# Patient Record
Sex: Female | Born: 1960
Health system: Southern US, Community
[De-identification: ages and names within clinical notes are randomized; demographics above are authoritative.]

## PROBLEM LIST (undated history)

## (undated) DIAGNOSIS — F1910 Other psychoactive substance abuse, uncomplicated: Secondary | ICD-10-CM

## (undated) DIAGNOSIS — I2510 Atherosclerotic heart disease of native coronary artery without angina pectoris: Secondary | ICD-10-CM

## (undated) DIAGNOSIS — Z720 Tobacco use: Secondary | ICD-10-CM

## (undated) DIAGNOSIS — I83899 Varicose veins of unspecified lower extremities with other complications: Secondary | ICD-10-CM

## (undated) DIAGNOSIS — I209 Angina pectoris, unspecified: Secondary | ICD-10-CM

## (undated) DIAGNOSIS — R0602 Shortness of breath: Secondary | ICD-10-CM

## (undated) DIAGNOSIS — I10 Essential (primary) hypertension: Secondary | ICD-10-CM

## (undated) DIAGNOSIS — Z789 Other specified health status: Secondary | ICD-10-CM

## (undated) DIAGNOSIS — K219 Gastro-esophageal reflux disease without esophagitis: Secondary | ICD-10-CM

## (undated) DIAGNOSIS — I83893 Varicose veins of bilateral lower extremities with other complications: Secondary | ICD-10-CM

## (undated) DIAGNOSIS — G8929 Other chronic pain: Secondary | ICD-10-CM

## (undated) DIAGNOSIS — I219 Acute myocardial infarction, unspecified: Secondary | ICD-10-CM

## (undated) DIAGNOSIS — M797 Fibromyalgia: Secondary | ICD-10-CM

## (undated) DIAGNOSIS — F329 Major depressive disorder, single episode, unspecified: Secondary | ICD-10-CM

## (undated) DIAGNOSIS — K760 Fatty (change of) liver, not elsewhere classified: Secondary | ICD-10-CM

## (undated) DIAGNOSIS — E871 Hypo-osmolality and hyponatremia: Secondary | ICD-10-CM

## (undated) DIAGNOSIS — E119 Type 2 diabetes mellitus without complications: Secondary | ICD-10-CM

## (undated) DIAGNOSIS — E785 Hyperlipidemia, unspecified: Secondary | ICD-10-CM

## (undated) DIAGNOSIS — F419 Anxiety disorder, unspecified: Secondary | ICD-10-CM

## (undated) DIAGNOSIS — M1990 Unspecified osteoarthritis, unspecified site: Secondary | ICD-10-CM

## (undated) DIAGNOSIS — G709 Myoneural disorder, unspecified: Secondary | ICD-10-CM

## (undated) DIAGNOSIS — K432 Incisional hernia without obstruction or gangrene: Secondary | ICD-10-CM

## (undated) DIAGNOSIS — J449 Chronic obstructive pulmonary disease, unspecified: Secondary | ICD-10-CM

## (undated) DIAGNOSIS — F1120 Opioid dependence, uncomplicated: Secondary | ICD-10-CM

## (undated) DIAGNOSIS — F32A Depression, unspecified: Secondary | ICD-10-CM

## (undated) HISTORY — DX: Unspecified osteoarthritis, unspecified site: M19.90

## (undated) HISTORY — DX: Incisional hernia without obstruction or gangrene: K43.2

## (undated) HISTORY — PX: CARDIAC CATHETERIZATION: SHX172

## (undated) HISTORY — DX: Chronic obstructive pulmonary disease, unspecified: J44.9

## (undated) HISTORY — PX: TUBAL LIGATION: SHX77

## (undated) HISTORY — PX: CARDIAC SURGERY: SHX584

## (undated) HISTORY — DX: Acute myocardial infarction, unspecified: I21.9

## (undated) HISTORY — PX: TONSILLECTOMY AND ADENOIDECTOMY: SHX28

## (undated) HISTORY — DX: Depression, unspecified: F32.A

## (undated) HISTORY — DX: Other chronic pain: G89.29

## (undated) HISTORY — DX: Hyperlipidemia, unspecified: E78.5

## (undated) HISTORY — DX: Fatty (change of) liver, not elsewhere classified: K76.0

## (undated) HISTORY — DX: Other psychoactive substance abuse, uncomplicated: F19.10

## (undated) HISTORY — DX: Anxiety disorder, unspecified: F41.9

## (undated) HISTORY — DX: Myoneural disorder, unspecified: G70.9

## (undated) HISTORY — DX: Gastro-esophageal reflux disease without esophagitis: K21.9

## (undated) HISTORY — PX: CHOLECYSTECTOMY: SHX55

## (undated) HISTORY — PX: MANDIBLE FRACTURE SURGERY: SHX706

## (undated) HISTORY — DX: Essential (primary) hypertension: I10

## (undated) HISTORY — DX: Atherosclerotic heart disease of native coronary artery without angina pectoris: I25.10

## (undated) HISTORY — DX: Major depressive disorder, single episode, unspecified: F32.9

## (undated) MED FILL — Ferumoxytol Inj 510 MG/17ML (30 MG/ML) (Elemental Fe): INTRAVENOUS | Qty: 17 | Status: AC

---
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
## 1997-08-19 ENCOUNTER — Emergency Department (HOSPITAL_COMMUNITY): Admission: EM | Admit: 1997-08-19 | Discharge: 1997-08-19 | Payer: Self-pay | Admitting: Emergency Medicine

## 1998-06-07 ENCOUNTER — Emergency Department (HOSPITAL_COMMUNITY): Admission: EM | Admit: 1998-06-07 | Discharge: 1998-06-07 | Payer: Self-pay | Admitting: Emergency Medicine

## 1998-06-07 ENCOUNTER — Encounter: Payer: Self-pay | Admitting: Emergency Medicine

## 2000-04-27 ENCOUNTER — Emergency Department (HOSPITAL_COMMUNITY): Admission: EM | Admit: 2000-04-27 | Discharge: 2000-04-27 | Payer: Self-pay | Admitting: Emergency Medicine

## 2000-05-05 ENCOUNTER — Emergency Department (HOSPITAL_COMMUNITY): Admission: EM | Admit: 2000-05-05 | Discharge: 2000-05-05 | Payer: Self-pay | Admitting: Emergency Medicine

## 2000-07-21 ENCOUNTER — Emergency Department (HOSPITAL_COMMUNITY): Admission: EM | Admit: 2000-07-21 | Discharge: 2000-07-21 | Payer: Self-pay | Admitting: Emergency Medicine

## 2000-07-21 ENCOUNTER — Encounter: Payer: Self-pay | Admitting: Emergency Medicine

## 2000-09-05 ENCOUNTER — Encounter: Payer: Self-pay | Admitting: Chiropractic Medicine

## 2000-09-05 ENCOUNTER — Ambulatory Visit (HOSPITAL_COMMUNITY): Admission: RE | Admit: 2000-09-05 | Discharge: 2000-09-05 | Payer: Self-pay | Admitting: Chiropractic Medicine

## 2005-06-19 ENCOUNTER — Emergency Department (HOSPITAL_COMMUNITY): Admission: EM | Admit: 2005-06-19 | Discharge: 2005-06-19 | Payer: Self-pay | Admitting: Emergency Medicine

## 2005-10-11 ENCOUNTER — Emergency Department (HOSPITAL_COMMUNITY): Admission: EM | Admit: 2005-10-11 | Discharge: 2005-10-11 | Payer: Self-pay | Admitting: Emergency Medicine

## 2007-09-07 ENCOUNTER — Emergency Department: Payer: Self-pay | Admitting: Emergency Medicine

## 2008-02-29 DIAGNOSIS — I219 Acute myocardial infarction, unspecified: Secondary | ICD-10-CM

## 2008-02-29 HISTORY — DX: Acute myocardial infarction, unspecified: I21.9

## 2008-08-27 ENCOUNTER — Emergency Department (HOSPITAL_COMMUNITY): Admission: EM | Admit: 2008-08-27 | Discharge: 2008-08-27 | Payer: Self-pay | Admitting: Emergency Medicine

## 2011-01-08 ENCOUNTER — Encounter (INDEPENDENT_AMBULATORY_CARE_PROVIDER_SITE_OTHER): Payer: Self-pay | Admitting: General Surgery

## 2011-01-10 ENCOUNTER — Encounter (INDEPENDENT_AMBULATORY_CARE_PROVIDER_SITE_OTHER): Payer: Self-pay | Admitting: General Surgery

## 2011-01-10 ENCOUNTER — Other Ambulatory Visit (INDEPENDENT_AMBULATORY_CARE_PROVIDER_SITE_OTHER): Payer: Self-pay | Admitting: General Surgery

## 2011-01-10 ENCOUNTER — Ambulatory Visit (INDEPENDENT_AMBULATORY_CARE_PROVIDER_SITE_OTHER): Payer: Medicare Other | Admitting: General Surgery

## 2011-01-10 VITALS — BP 138/86 | HR 84 | Temp 98.7°F | Resp 18 | Ht 64.0 in | Wt 203.6 lb

## 2011-01-10 DIAGNOSIS — K430 Incisional hernia with obstruction, without gangrene: Secondary | ICD-10-CM

## 2011-01-10 DIAGNOSIS — R222 Localized swelling, mass and lump, trunk: Secondary | ICD-10-CM

## 2011-01-10 NOTE — Progress Notes (Signed)
Patient ID: Makayla Fischer, female   DOB: February 05, 1960, 50 y.o.   MRN: [id number]  Chief Complaint  Patient presents with  . Other    new pt- eval ventral hernia    HPI Makayla Fischer is a 50 y.o. female.  She is referred by Dr. Duffy Rhody for evaluation of an abdominal wall hernia.  The patient has had a laparoscopic tubal ligation many years ago through an infraumbilical incision. She gives a one-month history of a lump to the right of her umbilicus. This is a little bit tender. She cannot push it back in. She has not had any nausea, vomiting, or change in her bowel habits. She has not had any imaging studies.  Significant medical problems are coronary artery disease, MI, cardiac cath with stent in 2004 in [place] and 2010 at [facility]. She is not actively followed by cardiologist. She also has ongoing tobacco abuse. She has chronic pain which is managed with methadone by Dr. Marina Goodell. She has hypertension, anxiety, and she states that she may have been told she had Barrett's esophagus in the past.  She is in no acute distress today. HPI  Past Medical History  Diagnosis Date  . Hypertension   . Chronic pain   . Hyperlipidemia   . GERD (gastroesophageal reflux disease)   . Anxiety   . Depression   . CAD (coronary artery disease)   . Arthritis   . Neuromuscular disorder   . Myocardial infarction 02/2008  . Osteoporosis   . Substance abuse     drugs and alcohol    Past Surgical History  Procedure Date  . Tonsillectomy and adenoidectomy   . Cardiac surgery   . Coronary artery bypass graft   . Mandible fracture surgery     Family History  Problem Relation Age of Onset  . Diabetes Mother   . Hyperlipidemia Mother   . Hypertension Mother   . Heart disease Mother   . Cancer Father     lymphoma    Social History History  Substance Use Topics  . Smoking status: Current Everyday Smoker -- 0.5 packs/day  . Smokeless tobacco: Not on file  . Alcohol Use: Yes     Allergies  Allergen Reactions  . Septra (Bactrim)     Current Outpatient Prescriptions  Medication Sig Dispense Refill  . amLODipine (NORVASC) 10 MG tablet Take 10 mg by mouth daily.        Marland Kitchen aspirin 81 MG tablet Take 81 mg by mouth daily.        Marland Kitchen buPROPion (WELLBUTRIN SR) 150 MG 12 hr tablet Take 150 mg by mouth 2 (two) times daily.        . calcium carbonate 200 MG capsule Take 1,000 mg by mouth daily.        . carvedilol (COREG) 6.25 MG tablet Take 6.25 mg by mouth 2 (two) times daily with a meal.        . chlorthalidone (HYGROTON) 50 MG tablet daily.      . clonazePAM (KLONOPIN) 1 MG tablet Take 1 mg by mouth 2 (two) times daily as needed.        . cyclobenzaprine (FLEXERIL) 10 MG tablet Take 10 mg by mouth 3 (three) times daily as needed.        . isosorbide mononitrate (IMDUR) 30 MG 24 hr tablet Take 30 mg by mouth daily.        Marland Kitchen lisinopril (PRINIVIL,ZESTRIL) 20 MG tablet Take 20 mg by mouth  daily.        . methadone (DOLOPHINE) 10 MG tablet Take 10 mg by mouth every 8 (eight) hours.        . Multiple Vitamins-Minerals (MULTIVITAMIN WITH MINERALS) tablet Take 1 tablet by mouth daily.        . nitroGLYCERIN (NITROSTAT) 0.4 MG SL tablet Place 0.4 mg under the tongue every 5 (five) minutes as needed.        Marland Kitchen omeprazole (PRILOSEC) 20 MG capsule Take 20 mg by mouth daily.        . pravastatin (PRAVACHOL) 40 MG tablet Take 40 mg by mouth daily.        . traMADol (ULTRAM) 50 MG tablet Take 50 mg by mouth every 6 (six) hours as needed. Maximum dose= 8 tablets per day         Review of Systems Review of Systems  Constitutional: Negative for fever, chills and unexpected weight change.  HENT: Negative for hearing loss, congestion, sore throat, trouble swallowing and voice change.   Eyes: Negative for visual disturbance.  Respiratory: Negative for cough and wheezing.   Cardiovascular: Negative for chest pain, palpitations and leg swelling.  Gastrointestinal: Positive for abdominal  pain. Negative for nausea, vomiting, diarrhea, constipation, blood in stool, abdominal distention and anal bleeding.  Genitourinary: Negative for hematuria, vaginal bleeding and difficulty urinating.  Musculoskeletal: Positive for myalgias, back pain and arthralgias.  Skin: Negative for rash and wound.  Neurological: Negative for seizures, syncope and headaches.  Hematological: Negative for adenopathy. Does not bruise/bleed easily.  Psychiatric/Behavioral: Negative for confusion.    Blood pressure 138/86, pulse 84, temperature 98.7 F (37.1 C), temperature source Temporal, resp. rate 18, height 5\' 4"  (1.626 m), weight 203 lb 9.6 oz (92.352 kg).  Physical Exam Physical Exam  Constitutional: She is oriented to person, place, and time. She appears well-developed and well-nourished. No distress.       strong odor of tobacco.  HENT:  Head: Normocephalic and atraumatic.  Nose: Nose normal.  Mouth/Throat: No oropharyngeal exudate.  Eyes: Conjunctivae and EOM are normal. Pupils are equal, round, and reactive to light. Left eye exhibits no discharge. No scleral icterus.  Neck: Neck supple. No JVD present. No tracheal deviation present. No thyromegaly present.  Cardiovascular: Normal rate, regular rhythm, normal heart sounds and intact distal pulses.   No murmur heard. Pulmonary/Chest: Effort normal and breath sounds normal. No respiratory distress. She has no wheezes. She has no rales. She exhibits no tenderness.  Abdominal: Soft. Bowel sounds are normal. She exhibits no distension and no mass. There is no tenderness. There is no rebound and no guarding.    Musculoskeletal: She exhibits no edema and no tenderness.  Lymphadenopathy:    She has no cervical adenopathy.  Neurological: She is alert and oriented to person, place, and time. She exhibits normal muscle tone. Coordination normal.  Skin: Skin is warm. No rash noted. She is not diaphoretic. No erythema. No pallor.  Psychiatric: She has a  normal mood and affect. Her behavior is normal. Judgment and thought content normal.    Data Reviewed  I reviewed several office notes from Dr. Duffy Rhody  Assessment    Incarcerated ventral incisional hernia to right of umbilicus. She will likely benefit from elective repair.  Coronary artery disease, status post MI, status post cath and  stent x2. Stable.  Tobacco abuse  Hypertension  Chronic pain managed with methadone  Obesity    Plan    I discussed her diagnosis and  some of the surgical options with her. She is interested in surgical repair.  We will schedule for CT scan of abdomen and pelvis to get better definition of intra-abdominal anatomy.  I told her that she would need to be referred to a cardiologist for cardiac clearance for general anesthesia. She will contact Dr. Marina Goodell  To have this arranged.  Return to see me in 3 weeks to review the CT scan and to schedule for definitive surgery.       Shekia Kuper M 01/10/2011, 9:37 AM

## 2011-01-10 NOTE — Patient Instructions (Signed)
I think that you have an incarcerated incisional hernia to the right of the umbilicus. This is tender and painful. You will be scheduled for a CT scan of the abdomen and pelvis to better define what is going on inside your abdomen. You will return to see me in 2-3 weeks to discuss surgery for this hernia. You will need to see a cardiologist prior to any surgery to clear you for general anesthesia. Please call Dr. Marina Goodell and arrange for cardiology consultation.

## 2011-01-15 ENCOUNTER — Ambulatory Visit
Admission: RE | Admit: 2011-01-15 | Discharge: 2011-01-15 | Disposition: A | Discharge status: Home / Self Care | Payer: Medicare Other | Source: Ambulatory Visit | Attending: General Surgery | Admitting: General Surgery

## 2011-01-15 ENCOUNTER — Encounter (INDEPENDENT_AMBULATORY_CARE_PROVIDER_SITE_OTHER): Payer: Self-pay | Admitting: Legal Medicine

## 2011-01-15 DIAGNOSIS — R222 Localized swelling, mass and lump, trunk: Secondary | ICD-10-CM

## 2011-01-15 MED ORDER — IOHEXOL 300 MG/ML  SOLN
125.0000 mL | Freq: Once | INTRAMUSCULAR | Status: AC | PRN
  Administered 2011-01-15: 125 mL via INTRAVENOUS

## 2011-02-04 ENCOUNTER — Encounter (INDEPENDENT_AMBULATORY_CARE_PROVIDER_SITE_OTHER): Payer: Medicare Other | Admitting: General Surgery

## 2011-04-18 ENCOUNTER — Encounter (INDEPENDENT_AMBULATORY_CARE_PROVIDER_SITE_OTHER): Payer: Self-pay

## 2011-05-06 ENCOUNTER — Telehealth (INDEPENDENT_AMBULATORY_CARE_PROVIDER_SITE_OTHER): Payer: Self-pay | Admitting: General Surgery

## 2011-05-07 ENCOUNTER — Telehealth (INDEPENDENT_AMBULATORY_CARE_PROVIDER_SITE_OTHER): Payer: Self-pay

## 2011-05-07 NOTE — Telephone Encounter (Signed)
I tried to reach pt re: appt.  Unable to reach pt at # given. Could not leave msg mail box full. I have put in appt for May 2 to arrive at 10:30.

## 2011-05-08 ENCOUNTER — Telehealth (INDEPENDENT_AMBULATORY_CARE_PROVIDER_SITE_OTHER): Payer: Self-pay

## 2011-05-08 NOTE — Telephone Encounter (Signed)
Pt notified of 5-2 appt.

## 2011-05-30 ENCOUNTER — Encounter (INDEPENDENT_AMBULATORY_CARE_PROVIDER_SITE_OTHER): Payer: Medicare Other | Admitting: General Surgery

## 2011-06-07 ENCOUNTER — Encounter (INDEPENDENT_AMBULATORY_CARE_PROVIDER_SITE_OTHER): Payer: Medicare Other | Admitting: General Surgery

## 2011-06-12 ENCOUNTER — Encounter (INDEPENDENT_AMBULATORY_CARE_PROVIDER_SITE_OTHER): Payer: Self-pay | Admitting: General Surgery

## 2011-06-12 ENCOUNTER — Telehealth (INDEPENDENT_AMBULATORY_CARE_PROVIDER_SITE_OTHER): Payer: Self-pay

## 2011-06-12 ENCOUNTER — Ambulatory Visit (INDEPENDENT_AMBULATORY_CARE_PROVIDER_SITE_OTHER): Payer: Medicare Other | Admitting: General Surgery

## 2011-06-12 VITALS — BP 126/64 | HR 60 | Temp 97.8°F | Resp 16 | Ht 64.0 in | Wt 198.0 lb

## 2011-06-12 DIAGNOSIS — K432 Incisional hernia without obstruction or gangrene: Secondary | ICD-10-CM | POA: Insufficient documentation

## 2011-06-12 NOTE — Progress Notes (Signed)
Patient ID: Makayla Fischer, female   DOB: 01/14/1961, 51 y.o.   MRN: [id number]  Chief Complaint  Patient presents with  . Incisional Hernia    HPI Makayla Fischer is a 51 y.o. female.  She returns for further discussion regarding her incisional hernia.  She states that her hernia has gotten a little bit bigger. She says it is still uncomfortable and hard to push back in.  CT scan shows a small periumbilical incisional hernia containing fat. She has significant fatty infiltration of the liver.  She has been evaluated by her cardiologist, Dr. Gypsy Balsam at [facility] cardiology [facility].    Nuclear medicine stress test looked good with no ischemia, normal wall motion, ejection fraction greater than 65%, normal exercise capacity.  She wants to have her hernia repaired HPI  Past Medical History  Diagnosis Date  . Hypertension   . Chronic pain   . Hyperlipidemia   . GERD (gastroesophageal reflux disease)   . Anxiety   . Depression   . CAD (coronary artery disease)   . Arthritis   . Neuromuscular disorder   . Myocardial infarction 02/2008  . Osteoporosis   . Substance abuse     drugs and alcohol  . Incisional hernia     with obstruction    Past Surgical History  Procedure Date  . Tonsillectomy and adenoidectomy   . Cardiac surgery   . Coronary artery bypass graft   . Mandible fracture surgery     Family History  Problem Relation Age of Onset  . Diabetes Mother   . Hyperlipidemia Mother   . Hypertension Mother   . Heart disease Mother   . Cancer Father     lymphoma    Social History History  Substance Use Topics  . Smoking status: Current Everyday Smoker -- 0.5 packs/day  . Smokeless tobacco: Never Used  . Alcohol Use: Yes    Allergies  Allergen Reactions  . Septra (Bactrim) Shortness Of Breath and Itching    Current Outpatient Prescriptions  Medication Sig Dispense Refill  . amLODipine (NORVASC) 10 MG tablet Take 10 mg by mouth  daily.        Marland Kitchen aspirin 81 MG tablet Take 81 mg by mouth daily.        Marland Kitchen buPROPion (WELLBUTRIN SR) 150 MG 12 hr tablet Take 150 mg by mouth 2 (two) times daily.        . calcium carbonate 200 MG capsule Take 1,000 mg by mouth daily.        . carvedilol (COREG) 6.25 MG tablet Take 6.25 mg by mouth 2 (two) times daily with a meal.        . chlorthalidone (HYGROTON) 50 MG tablet daily.      . clonazePAM (KLONOPIN) 1 MG tablet Take 1 mg by mouth 2 (two) times daily as needed.        . cyclobenzaprine (FLEXERIL) 10 MG tablet Take 10 mg by mouth 3 (three) times daily as needed.        . isosorbide mononitrate (IMDUR) 30 MG 24 hr tablet Take 30 mg by mouth daily.        Marland Kitchen lisinopril (PRINIVIL,ZESTRIL) 20 MG tablet Take 20 mg by mouth daily.        . methadone (DOLOPHINE) 10 MG tablet Take 10 mg by mouth every 8 (eight) hours.        . Multiple Vitamins-Minerals (MULTIVITAMIN WITH MINERALS) tablet Take 1 tablet by mouth daily.        Marland Kitchen  nitroGLYCERIN (NITROSTAT) 0.4 MG SL tablet Place 0.4 mg under the tongue every 5 (five) minutes as needed.        Marland Kitchen omeprazole (PRILOSEC) 20 MG capsule Take 20 mg by mouth daily.        . pravastatin (PRAVACHOL) 40 MG tablet Take 40 mg by mouth daily.        . traMADol (ULTRAM) 50 MG tablet Take 50 mg by mouth every 6 (six) hours as needed. Maximum dose= 8 tablets per day         Review of Systems Review of Systems  Constitutional: Negative for fever, chills and unexpected weight change.  HENT: Negative for hearing loss, congestion, sore throat, trouble swallowing and voice change.   Eyes: Negative for visual disturbance.  Respiratory: Negative for cough and wheezing.   Cardiovascular: Negative for chest pain, palpitations and leg swelling.  Gastrointestinal: Negative for nausea, vomiting, abdominal pain, diarrhea, constipation, blood in stool, abdominal distention and anal bleeding.  Genitourinary: Negative for hematuria, vaginal bleeding and difficulty urinating.    Musculoskeletal: Negative for arthralgias.  Skin: Negative for rash and wound.  Neurological: Negative for seizures, syncope and headaches.  Hematological: Negative for adenopathy. Does not bruise/bleed easily.  Psychiatric/Behavioral: Negative for confusion.    Blood pressure 126/64, pulse 60, temperature 97.8 F (36.6 C), temperature source Temporal, resp. rate 16, height 5\' 4"  (1.626 m), weight 198 lb (89.812 kg).  Physical Exam Physical Exam  Constitutional: She is oriented to person, place, and time. She appears well-developed and well-nourished. No distress.       Obese. Odor of tobacco.  HENT:  Head: Normocephalic and atraumatic.  Nose: Nose normal.  Mouth/Throat: No oropharyngeal exudate.  Eyes: Conjunctivae and EOM are normal. Pupils are equal, round, and reactive to light. Left eye exhibits no discharge. No scleral icterus.  Neck: Neck supple. No JVD present. No tracheal deviation present. No thyromegaly present.  Cardiovascular: Normal rate, regular rhythm, normal heart sounds and intact distal pulses.   No murmur heard. Pulmonary/Chest: Effort normal and breath sounds normal. No respiratory distress. She has no wheezes. She has no rales. She exhibits no tenderness.  Abdominal: Soft. Bowel sounds are normal. She exhibits no distension and no mass. There is no tenderness. There is no rebound and no guarding.       Small hernia to the right of the umbilicus, I can forcibly reduce when she is supine. The defect is 2-3 cm. The hernia sac is probably 6 or 7 cm in size.  Musculoskeletal: She exhibits no edema and no tenderness.  Lymphadenopathy:    She has no cervical adenopathy.  Neurological: She is alert and oriented to person, place, and time. She exhibits normal muscle tone. Coordination normal.  Skin: Skin is warm. No rash noted. She is not diaphoretic. No erythema. No pallor.  Psychiatric: She has a normal mood and affect. Her behavior is normal. Judgment and thought  content normal.    Data Reviewed Cardiology notes  Assessment    Ventral incisional hernia, symptomatic. This hernia is not large and should be amenable to laparoscopic repair with inlay mesh. I discussed this with her and she would like to have this done  Status post laparoscopic tubal ligation  CAD, status post myocardial infarction, status post cardiac catheterization with stent placement in 2004 in [place] and  in 2010 at [place].  Tobacco abuse  Chronic pain syndrome on methadone  Hypertension  Obesity.    Plan    Will schedule for  laparoscopic repair of ventral hernia with mesh, possible open at either [facility] or [facility].  She is encouraged to stop smoking  I have discussed indications and details of surgery with her. She knows that she is at increased risk for wound infection and recurrence. Other issues would include bleeding, injury to adjacent organs requiring major laparotomy, cardiac, pulmonary and thromboembolic problems. She understands the tissues. Her questions were answered. She agrees with this plan.       Angelia Mould. Derrell Lolling, M.D., [facility] General and Minimally invasive Surgery Breast and Colorectal Surgery Office:   [phone] Pager:   [phone]  06/12/2011, 12:33 PM

## 2011-06-12 NOTE — Telephone Encounter (Signed)
I received call from cardiologists office that pt is cleared from surgery. I advised nurse that we need note in print that pt cleared. She will have MD make note and fax it to [phone].

## 2011-06-12 NOTE — Patient Instructions (Signed)
You will be scheduled for surgery to repair your incisional hernia. We will probably be able to do this laparoscopically, but there is a small chance it may have to be converted to an open operation.

## 2011-06-28 ENCOUNTER — Inpatient Hospital Stay (HOSPITAL_COMMUNITY): Admission: RE | Admit: 2011-06-28 | Discharge: 2011-06-28 | Payer: Medicare Other | Source: Ambulatory Visit

## 2011-06-28 ENCOUNTER — Encounter (HOSPITAL_COMMUNITY): Payer: Self-pay

## 2011-06-28 NOTE — Pre-Procedure Instructions (Signed)
20 Makayla Fischer  06/28/2011   Your procedure is scheduled on:  07/12/2011  Report to [facility] at 5:30 AM.  Call this number if you have problems the morning of surgery: [phone]   Remember:   Do not eat food:After Midnight. 07/11/2011  May have clear liquids: up to 4 Hours before arrival.  NOTHING AFTER 1:30a.m.   Clear liquids include soda, tea, black coffee, apple or grape juice, broth.  Take these medicines the morning of surgery with A SIP OF WATER: wellbutrin,coreg,methadone   Do not wear jewelry, make-up or nail polish.  Do not wear lotions, powders, or perfumes. You may wear deodorant.  Do not shave 48 hours prior to surgery. Men may shave face and neck.  Do not bring valuables to the hospital.  Contacts, dentures or bridgework may not be worn into surgery.  Leave suitcase in the car. After surgery it may be brought to your room.  For patients admitted to the hospital, checkout time is 11:00 AM the day of discharge.   Patients discharged the day of surgery will not be allowed to drive home.  Name and phone number of your driver: /w ex spouse  Special Instructions: CHG Shower Use Special Wash: 1/2 bottle night before surgery and 1/2 bottle morning of surgery.   Please read over the following fact sheets that you were given: Pain Booklet, Coughing and Deep Breathing, MRSA Information and Surgical Site Infection Prevention

## 2011-06-28 NOTE — Progress Notes (Signed)
Call to [employer], after hrs. At present, will try again next business day.

## 2011-07-01 ENCOUNTER — Encounter (HOSPITAL_COMMUNITY): Payer: Self-pay | Admitting: Respiratory Therapy

## 2011-07-03 ENCOUNTER — Encounter (HOSPITAL_COMMUNITY)
Admission: RE | Admit: 2011-07-03 | Discharge: 2011-07-03 | Disposition: A | Discharge status: Home / Self Care | Payer: Medicare Other | Source: Ambulatory Visit | Attending: General Surgery | Admitting: General Surgery

## 2011-07-03 LAB — CBC
HCT: 39.7 % (ref 36.0–46.0)
MCHC: 35.8 g/dL (ref 30.0–36.0)
MCV: 94.1 fL (ref 78.0–100.0)
RDW: 12.1 % (ref 11.5–15.5)

## 2011-07-03 LAB — COMPREHENSIVE METABOLIC PANEL
Albumin: 3.8 g/dL (ref 3.5–5.2)
BUN: 6 mg/dL (ref 6–23)
Creatinine, Ser: 0.58 mg/dL (ref 0.50–1.10)
GFR calc Af Amer: >90 mL/min (ref >90)
Glucose, Bld: 157 mg/dL — ABNORMAL HIGH (ref 70–99)
Total Bilirubin: 0.8 mg/dL (ref 0.3–1.2)
Total Protein: 7.4 g/dL (ref 6.0–8.3)

## 2011-07-03 LAB — URINALYSIS, ROUTINE W REFLEX MICROSCOPIC
Glucose, UA: NEGATIVE mg/dL
Leukocytes, UA: NEGATIVE
Nitrite: NEGATIVE
Protein, ur: NEGATIVE mg/dL

## 2011-07-03 LAB — SURGICAL PCR SCREEN: MRSA, PCR: NEGATIVE

## 2011-07-03 NOTE — Pre-Procedure Instructions (Signed)
20 Makayla Fischer  07/03/2011   Your procedure is scheduled on:  Friday July 12, 2011.  Report to [facility] at 0530 AM.  Call this number if you have problems the morning of surgery: [phone]   Remember:   Do not eat food:After Midnight.  May have clear liquids: up to 4 Hours before arrival until 0130 am.  Clear liquids include soda, tea, black coffee, apple or grape juice, broth.  Take these medicines the morning of surgery with A SIP OF WATER: Amlodipine (Norvasc), Bupropion (Wellbutrin), Carvedilol (Coreg), Clonazepam (Klonopin), Gabapentin (Neurontin), Isosorbide (Imdur), Methadone (Dolophine), Omeprazole (Prilosec) and Tramadol (Ultram) if needed for pain.   Do not wear jewelry, make-up or nail polish.  Do not wear lotions, powders, or perfumes.   Do not shave 48 hours prior to surgery.   Do not bring valuables to the hospital.  Contacts, dentures or bridgework may not be worn into surgery.  Leave suitcase in the car. After surgery it may be brought to your room.  For patients admitted to the hospital, checkout time is 11:00 AM the day of discharge.   Patients discharged the day of surgery will not be allowed to drive home.  Name and phone number of your driver:   Special Instructions: CHG Shower Use Special Wash: 1/2 bottle night before surgery and 1/2 bottle morning of surgery.   Please read over the following fact sheets that you were given: Pain Booklet, Coughing and Deep Breathing, MRSA Information and Surgical Site Infection Prevention

## 2011-07-04 ENCOUNTER — Encounter (HOSPITAL_COMMUNITY): Payer: Self-pay | Admitting: Vascular Surgery

## 2011-07-04 NOTE — Consult Note (Signed)
Anesthesia Chart Review:  Patient is a 51 year old female scheduled for laparoscopic versus open repair of incisional hernia on 07/12/11.  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  Her Cardiologist is Dr. Ledora Bottcher at [facility].  He has cleared Ms. Jeter with low risk.  PCP is Dr. Brent Bulla [phone]) Cardiology records are re-requested on 07/04/11.    EKG on 07/03/11 showed NSR.  Stress test on 02/18/11 (under Media tab) showed no evidence of ischemia, normal wall motion, EF > 65%.  CT scan of the abdomen/pelvis on 01/15/11 showed: 1. Right periumbilical hernia containing fat.  2. Severe fatty infiltration of the liver.  3. The appendix and terminal ileum appear normal.  CXR on 07/03/11 showed: No acute cardiopulmonary disease. Scarring in the left lower lobe. Stable examination.   Labs reviewed.  Non-fasting glucose is 157.  Alk Ph 184, AST 81, ALT 64.  Her PLT count is WNL. According to Epic, labs have already been reviewed by Dr. Derrell Lolling.  She is on statin therapy and has fatty liver by CT.  I called her PCP office and on 03/16/11 her AST was 63 and ALT 59, Alk Phos 143.  There has been a mild increase in her transaminases since February.  There is no thrombocytopenia.  Will not plan to do any additional labs preoperatively.  Anesthesiologist Dr. Noreene Larsson agrees.  Shonna Chock, PA-C

## 2011-07-08 ENCOUNTER — Telehealth (INDEPENDENT_AMBULATORY_CARE_PROVIDER_SITE_OTHER): Payer: Self-pay | Admitting: General Surgery

## 2011-07-08 NOTE — Telephone Encounter (Signed)
This patient called with concerns about her upcoming ventral hernia surgery. I called both her home phone and her mobile phone. The mailbox was full on both phones and I could not leave a message. We will try to contact her later.   Makayla Fischer. Derrell Lolling, M.D., [facility] General and Minimally invasive Surgery Breast and Colorectal Surgery Office:   [phone] Pager:   [phone]

## 2011-07-11 MED ORDER — HEPARIN SODIUM (PORCINE) 5000 UNIT/ML IJ SOLN
5000.0000 [IU] | Freq: Once | INTRAMUSCULAR | Status: AC
  Administered 2011-07-12: 5000 [IU] via SUBCUTANEOUS
  Filled 2011-07-11: qty 1

## 2011-07-11 MED ORDER — CHLORHEXIDINE GLUCONATE 4 % EX LIQD: 1.0000 "application " | Freq: Once | CUTANEOUS | Status: DC

## 2011-07-11 MED ORDER — CEFAZOLIN SODIUM-DEXTROSE 2-3 GM-% IV SOLR
2.0000 g | INTRAVENOUS | Status: AC
  Administered 2011-07-12: 2 g via INTRAVENOUS
  Filled 2011-07-11: qty 50

## 2011-07-11 NOTE — H&P (Signed)
Makayla Fischer    MRN: [id number]   Description: 51 year old female  Provider: Ernestene Mention, MD  Department: [facility]      Diagnoses     Incisional hernia   - Primary    553.21    Vitals       BP Pulse Temp Resp Ht Wt    126/64 60 97.8 F (36.6 C) (Temporal) 16 5\' 4"  (1.626 m) 198 lb (89.812 kg)    BMI - 33.99 kg/m2                 History and Physical    Ernestene Mention, MD Patient ID: Makayla Fischer, female   DOB: February 04, 1960, 51 y.o.   MRN: [id number]             HPI Makayla Fischer is a 50 y.o. female.  She presents for elective surgery fo her incisional hernia.  She states that her hernia has gotten a little bit bigger. She says it is still uncomfortable and hard to push back in.  CT scan shows a small periumbilical incisional hernia containing fat. She has significant fatty infiltration of the liver.  She has been evaluated by her cardiologist, Dr. Gypsy Balsam at [facility] cardiology [facility].    Nuclear medicine stress test looked good with no ischemia, normal wall motion, ejection fraction greater than 65%, normal exercise capacity.  She wants to have her hernia repaired     Past Medical History   Diagnosis  Date   .  Hypertension     .  Chronic pain     .  Hyperlipidemia     .  GERD (gastroesophageal reflux disease)     .  Anxiety     .  Depression     .  CAD (coronary artery disease)     .  Arthritis     .  Neuromuscular disorder     .  Myocardial infarction  02/2008   .  Osteoporosis     .  Substance abuse         drugs and alcohol   .  Incisional hernia         with obstruction       Past Surgical History   Procedure  Date   .  Tonsillectomy and adenoidectomy     .  Cardiac surgery     .  Coronary artery bypass graft     .  Mandible fracture surgery         Family History   Problem  Relation  Age of Onset   .  Diabetes  Mother     .  Hyperlipidemia  Mother     .  Hypertension  Mother       .  Heart disease  Mother     .  Cancer  Father         lymphoma      Social History History   Substance Use Topics   .  Smoking status:  Current Everyday Smoker -- 0.5 packs/day   .  Smokeless tobacco:  Never Used   .  Alcohol Use:  Yes       Allergies   Allergen  Reactions   .  Septra (Bactrim)  Shortness Of Breath and Itching       Current Outpatient Prescriptions   Medication  Sig  Dispense  Refill   .  amLODipine (NORVASC) 10 MG tablet  Take  10 mg by mouth daily.           Marland Kitchen  aspirin 81 MG tablet  Take 81 mg by mouth daily.           Marland Kitchen  buPROPion (WELLBUTRIN SR) 150 MG 12 hr tablet  Take 150 mg by mouth 2 (two) times daily.           .  calcium carbonate 200 MG capsule  Take 1,000 mg by mouth daily.           .  carvedilol (COREG) 6.25 MG tablet  Take 6.25 mg by mouth 2 (two) times daily with a meal.           .  chlorthalidone (HYGROTON) 50 MG tablet  daily.         .  clonazePAM (KLONOPIN) 1 MG tablet  Take 1 mg by mouth 2 (two) times daily as needed.           .  cyclobenzaprine (FLEXERIL) 10 MG tablet  Take 10 mg by mouth 3 (three) times daily as needed.           .  isosorbide mononitrate (IMDUR) 30 MG 24 hr tablet  Take 30 mg by mouth daily.           Marland Kitchen  lisinopril (PRINIVIL,ZESTRIL) 20 MG tablet  Take 20 mg by mouth daily.           .  methadone (DOLOPHINE) 10 MG tablet  Take 10 mg by mouth every 8 (eight) hours.           .  Multiple Vitamins-Minerals (MULTIVITAMIN WITH MINERALS) tablet  Take 1 tablet by mouth daily.           .  nitroGLYCERIN (NITROSTAT) 0.4 MG SL tablet  Place 0.4 mg under the tongue every 5 (five) minutes as needed.           Marland Kitchen  omeprazole (PRILOSEC) 20 MG capsule  Take 20 mg by mouth daily.           .  pravastatin (PRAVACHOL) 40 MG tablet  Take 40 mg by mouth daily.           .  traMADol (ULTRAM) 50 MG tablet  Take 50 mg by mouth every 6 (six) hours as needed. Maximum dose= 8 tablets per day             Review of Systems   Constitutional:  Negative for fever, chills and unexpected weight change.  HENT: Negative for hearing loss, congestion, sore throat, trouble swallowing and voice change.   Eyes: Negative for visual disturbance.  Respiratory: Negative for cough and wheezing.   Cardiovascular: Negative for chest pain, palpitations and leg swelling.  Gastrointestinal: Negative for nausea, vomiting, abdominal pain, diarrhea, constipation, blood in stool, abdominal distention and anal bleeding.  Genitourinary: Negative for hematuria, vaginal bleeding and difficulty urinating.  Musculoskeletal: Negative for arthralgias.  Skin: Negative for rash and wound.  Neurological: Negative for seizures, syncope and headaches.  Hematological: Negative for adenopathy. Does not bruise/bleed easily.  Psychiatric/Behavioral: Negative for confusion.    Blood pressure 126/64, pulse 60, temperature 97.8 F (36.6 C), temperature source Temporal, resp. rate 16, height 5\' 4"  (1.626 m), weight 198 lb (89.812 kg).   Physical Exam   Constitutional: She is oriented to person, place, and time. She appears well-developed and well-nourished. No distress.       Obese. Odor of tobacco.  HENT:   Head: Normocephalic and  atraumatic.   Nose: Nose normal.   Mouth/Throat: No oropharyngeal exudate.  Eyes: Conjunctivae and EOM are normal. Pupils are equal, round, and reactive to light. Left eye exhibits no discharge. No scleral icterus.  Neck: Neck supple. No JVD present. No tracheal deviation present. No thyromegaly present.  Cardiovascular: Normal rate, regular rhythm, normal heart sounds and intact distal pulses.    No murmur heard. Pulmonary/Chest: Effort normal and breath sounds normal. No respiratory distress. She has no wheezes. She has no rales. She exhibits no tenderness.  Abdominal: Soft. Bowel sounds are normal. She exhibits no distension and no mass. There is no tenderness. There is no rebound and no guarding.       Small hernia to the right of the  umbilicus, I can forcibly reduce when she is supine. The defect is 2-3 cm. The hernia sac is probably 6 or 7 cm in size.  Musculoskeletal: She exhibits no edema and no tenderness.  Lymphadenopathy:    She has no cervical adenopathy.  Neurological: She is alert and oriented to person, place, and time. She exhibits normal muscle tone. Coordination normal.  Skin: Skin is warm. No rash noted. She is not diaphoretic. No erythema. No pallor.  Psychiatric: She has a normal mood and affect. Her behavior is normal. Judgment and thought content normal.       Assessment  Ventral incisional hernia, symptomatic. This hernia is not large and should be amenable to laparoscopic repair with inlay mesh. I discussed this with her and she would like to have this done   Status post laparoscopic tubal ligation   CAD, status post myocardial infarction, status post cardiac catheterization with stent placement in 2004 in [place] and  in 2010 at [place].   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   Plan  Will schedule for laparoscopic repair of ventral hernia with mesh, possible open at either [facility] or [facility].   She is encouraged to stop smoking   I have discussed indications and details of surgery with her. She knows that she is at increased risk for wound infection and recurrence. Other issues would include bleeding, injury to adjacent organs requiring major laparotomy, cardiac, pulmonary and thromboembolic problems. She understands the tissues. Her questions were answered. She agrees with this plan.       Angelia Mould. Derrell Lolling, M.D., [facility] General and Minimally invasive Surgery Breast and Colorectal Surgery Office:   [phone] Pager:   [phone]

## 2011-07-12 ENCOUNTER — Encounter (HOSPITAL_COMMUNITY): Payer: Self-pay | Admitting: *Deleted

## 2011-07-12 ENCOUNTER — Encounter (HOSPITAL_COMMUNITY): Payer: Self-pay | Admitting: Vascular Surgery

## 2011-07-12 ENCOUNTER — Encounter (HOSPITAL_COMMUNITY)
Admission: RE | Disposition: A | Discharge status: Home / Self Care | Payer: Self-pay | Source: Ambulatory Visit | Attending: General Surgery

## 2011-07-12 ENCOUNTER — Ambulatory Visit (HOSPITAL_COMMUNITY): Payer: Medicare Other | Admitting: Vascular Surgery

## 2011-07-12 ENCOUNTER — Ambulatory Visit (HOSPITAL_COMMUNITY)
Admission: RE | Admit: 2011-07-12 | Discharge: 2011-07-15 | Disposition: A | Discharge status: Home / Self Care | Payer: Medicare Other | Source: Ambulatory Visit | Attending: General Surgery | Admitting: General Surgery

## 2011-07-12 DIAGNOSIS — F17200 Nicotine dependence, unspecified, uncomplicated: Secondary | ICD-10-CM | POA: Insufficient documentation

## 2011-07-12 DIAGNOSIS — I252 Old myocardial infarction: Secondary | ICD-10-CM | POA: Insufficient documentation

## 2011-07-12 DIAGNOSIS — C001 Malignant neoplasm of external lower lip: Secondary | ICD-10-CM | POA: Insufficient documentation

## 2011-07-12 DIAGNOSIS — F411 Generalized anxiety disorder: Secondary | ICD-10-CM | POA: Insufficient documentation

## 2011-07-12 DIAGNOSIS — I10 Essential (primary) hypertension: Secondary | ICD-10-CM | POA: Insufficient documentation

## 2011-07-12 DIAGNOSIS — J4489 Other specified chronic obstructive pulmonary disease: Secondary | ICD-10-CM | POA: Insufficient documentation

## 2011-07-12 DIAGNOSIS — IMO0001 Reserved for inherently not codable concepts without codable children: Secondary | ICD-10-CM | POA: Insufficient documentation

## 2011-07-12 DIAGNOSIS — F329 Major depressive disorder, single episode, unspecified: Secondary | ICD-10-CM | POA: Insufficient documentation

## 2011-07-12 DIAGNOSIS — K439 Ventral hernia without obstruction or gangrene: Secondary | ICD-10-CM

## 2011-07-12 DIAGNOSIS — K219 Gastro-esophageal reflux disease without esophagitis: Secondary | ICD-10-CM | POA: Insufficient documentation

## 2011-07-12 DIAGNOSIS — K432 Incisional hernia without obstruction or gangrene: Secondary | ICD-10-CM | POA: Insufficient documentation

## 2011-07-12 DIAGNOSIS — J449 Chronic obstructive pulmonary disease, unspecified: Secondary | ICD-10-CM | POA: Insufficient documentation

## 2011-07-12 DIAGNOSIS — F3289 Other specified depressive episodes: Secondary | ICD-10-CM | POA: Insufficient documentation

## 2011-07-12 HISTORY — DX: Shortness of breath: R06.02

## 2011-07-12 HISTORY — DX: Fibromyalgia: M79.7

## 2011-07-12 HISTORY — DX: Angina pectoris, unspecified: I20.9

## 2011-07-12 HISTORY — PX: VENTRAL HERNIA REPAIR: SHX424

## 2011-07-12 LAB — CBC
HCT: 35.7 % — ABNORMAL LOW (ref 36.0–46.0)
MCV: 95.5 fL (ref 78.0–100.0)
Platelets: 184 10*3/uL (ref 150–400)
RBC: 3.74 MIL/uL — ABNORMAL LOW (ref 3.87–5.11)
RDW: 12.2 % (ref 11.5–15.5)
WBC: 7.7 10*3/uL (ref 4.0–10.5)

## 2011-07-12 LAB — CREATININE, SERUM
GFR calc Af Amer: >90 mL/min (ref >90)
GFR calc non Af Amer: >90 mL/min (ref >90)

## 2011-07-12 SURGERY — REPAIR, HERNIA, VENTRAL, LAPAROSCOPIC
Anesthesia: General | Site: Abdomen | Wound class: Clean

## 2011-07-12 MED ORDER — METHADONE HCL 10 MG PO TABS
20.0000 mg | ORAL_TABLET | Freq: Four times a day (QID) | ORAL | Status: DC
  Administered 2011-07-12 – 2011-07-15 (×11): 20 mg via ORAL
  Filled 2011-07-12: qty 1
  Filled 2011-07-12 (×9): qty 2
  Filled 2011-07-12: qty 1
  Filled 2011-07-12: qty 2

## 2011-07-12 MED ORDER — ONDANSETRON HCL 4 MG PO TABS: 4.0000 mg | ORAL_TABLET | Freq: Four times a day (QID) | ORAL | Status: DC | PRN

## 2011-07-12 MED ORDER — CHLORTHALIDONE 50 MG PO TABS
50.0000 mg | ORAL_TABLET | Freq: Every day | ORAL | Status: DC
  Administered 2011-07-12 – 2011-07-15 (×4): 50 mg via ORAL
  Filled 2011-07-12 (×4): qty 1

## 2011-07-12 MED ORDER — LORAZEPAM 2 MG/ML IJ SOLN: 1.0000 mg | Freq: Once | INTRAMUSCULAR | Status: DC | PRN

## 2011-07-12 MED ORDER — CYCLOBENZAPRINE HCL 10 MG PO TABS
10.0000 mg | ORAL_TABLET | Freq: Three times a day (TID) | ORAL | Status: DC | PRN
  Administered 2011-07-13 – 2011-07-15 (×2): 10 mg via ORAL
  Filled 2011-07-12 (×2): qty 1

## 2011-07-12 MED ORDER — SODIUM CHLORIDE 0.9 % IR SOLN
Status: DC | PRN
  Administered 2011-07-12: 1

## 2011-07-12 MED ORDER — CLONAZEPAM 1 MG PO TABS
1.0000 mg | ORAL_TABLET | Freq: Two times a day (BID) | ORAL | Status: DC | PRN
  Administered 2011-07-12 – 2011-07-14 (×2): 1 mg via ORAL
  Filled 2011-07-12 (×2): qty 1

## 2011-07-12 MED ORDER — MIDAZOLAM HCL 2 MG/2ML IJ SOLN: 1.0000 mg | INTRAMUSCULAR | Status: DC | PRN

## 2011-07-12 MED ORDER — AMLODIPINE BESYLATE 10 MG PO TABS
10.0000 mg | ORAL_TABLET | Freq: Every day | ORAL | Status: DC
  Administered 2011-07-13 – 2011-07-14 (×2): 10 mg via ORAL
  Filled 2011-07-12 (×4): qty 1

## 2011-07-12 MED ORDER — PROPOFOL 10 MG/ML IV EMUL
INTRAVENOUS | Status: DC | PRN
  Administered 2011-07-12: 140 mg via INTRAVENOUS

## 2011-07-12 MED ORDER — PANTOPRAZOLE SODIUM 40 MG PO TBEC
40.0000 mg | DELAYED_RELEASE_TABLET | Freq: Every day | ORAL | Status: DC
  Administered 2011-07-13 – 2011-07-14 (×2): 40 mg via ORAL
  Filled 2011-07-12 (×2): qty 1

## 2011-07-12 MED ORDER — FENTANYL CITRATE 0.05 MG/ML IJ SOLN: 50.0000 ug | INTRAMUSCULAR | Status: DC | PRN

## 2011-07-12 MED ORDER — HYDROMORPHONE HCL PF 1 MG/ML IJ SOLN
INTRAMUSCULAR | Status: AC
  Filled 2011-07-12: qty 1

## 2011-07-12 MED ORDER — LIDOCAINE HCL (CARDIAC) 20 MG/ML IV SOLN
INTRAVENOUS | Status: DC | PRN
  Administered 2011-07-12: 80 mg via INTRAVENOUS

## 2011-07-12 MED ORDER — HYDROMORPHONE HCL PF 1 MG/ML IJ SOLN
0.2500 mg | INTRAMUSCULAR | Status: DC | PRN
  Administered 2011-07-12: 0.25 mg via INTRAVENOUS
  Administered 2011-07-12 (×4): 0.5 mg via INTRAVENOUS

## 2011-07-12 MED ORDER — POTASSIUM CHLORIDE IN NACL 20-0.9 MEQ/L-% IV SOLN
INTRAVENOUS | Status: DC
  Administered 2011-07-12: 13:00:00 via INTRAVENOUS
  Administered 2011-07-13: 100 mL/h via INTRAVENOUS
  Filled 2011-07-12 (×4): qty 1000

## 2011-07-12 MED ORDER — SODIUM CHLORIDE 0.9 % IR SOLN
Status: DC | PRN
  Administered 2011-07-12: 1000 mL

## 2011-07-12 MED ORDER — BUPROPION HCL ER (SR) 150 MG PO TB12
150.0000 mg | ORAL_TABLET | Freq: Two times a day (BID) | ORAL | Status: DC
  Administered 2011-07-12 – 2011-07-15 (×7): 150 mg via ORAL
  Filled 2011-07-12 (×8): qty 1

## 2011-07-12 MED ORDER — ROCURONIUM BROMIDE 100 MG/10ML IV SOLN
INTRAVENOUS | Status: DC | PRN
  Administered 2011-07-12: 10 mg via INTRAVENOUS
  Administered 2011-07-12: 40 mg via INTRAVENOUS

## 2011-07-12 MED ORDER — CARVEDILOL 6.25 MG PO TABS
6.2500 mg | ORAL_TABLET | Freq: Two times a day (BID) | ORAL | Status: DC
  Administered 2011-07-12 – 2011-07-15 (×6): 6.25 mg via ORAL
  Filled 2011-07-12 (×8): qty 1

## 2011-07-12 MED ORDER — TRAMADOL HCL 50 MG PO TABS
50.0000 mg | ORAL_TABLET | Freq: Four times a day (QID) | ORAL | Status: DC | PRN
  Administered 2011-07-12 – 2011-07-13 (×2): 50 mg via ORAL
  Filled 2011-07-12 (×2): qty 1

## 2011-07-12 MED ORDER — HYDROCODONE-ACETAMINOPHEN 5-325 MG PO TABS
1.0000 | ORAL_TABLET | ORAL | Status: DC | PRN
  Administered 2011-07-12 – 2011-07-15 (×6): 2 via ORAL
  Filled 2011-07-12 (×5): qty 2

## 2011-07-12 MED ORDER — LACTATED RINGERS IV SOLN
INTRAVENOUS | Status: DC | PRN
  Administered 2011-07-12: 07:00:00 via INTRAVENOUS

## 2011-07-12 MED ORDER — GABAPENTIN 300 MG PO CAPS
300.0000 mg | ORAL_CAPSULE | Freq: Three times a day (TID) | ORAL | Status: DC
  Administered 2011-07-12 – 2011-07-15 (×9): 300 mg via ORAL
  Filled 2011-07-12 (×11): qty 1

## 2011-07-12 MED ORDER — HYDROMORPHONE HCL PF 1 MG/ML IJ SOLN
1.0000 mg | INTRAMUSCULAR | Status: DC | PRN
  Administered 2011-07-12 – 2011-07-15 (×13): 1 mg via INTRAVENOUS
  Filled 2011-07-12 (×13): qty 1

## 2011-07-12 MED ORDER — NITROGLYCERIN 0.4 MG SL SUBL: 0.4000 mg | SUBLINGUAL_TABLET | SUBLINGUAL | Status: DC | PRN

## 2011-07-12 MED ORDER — FENTANYL CITRATE 0.05 MG/ML IJ SOLN
INTRAMUSCULAR | Status: DC | PRN
  Administered 2011-07-12: 150 ug via INTRAVENOUS
  Administered 2011-07-12: 50 ug via INTRAVENOUS

## 2011-07-12 MED ORDER — LISINOPRIL 20 MG PO TABS
20.0000 mg | ORAL_TABLET | Freq: Every day | ORAL | Status: DC
  Administered 2011-07-12 – 2011-07-14 (×3): 20 mg via ORAL
  Filled 2011-07-12 (×4): qty 1

## 2011-07-12 MED ORDER — MIDAZOLAM HCL 5 MG/5ML IJ SOLN
INTRAMUSCULAR | Status: DC | PRN
  Administered 2011-07-12: 2 mg via INTRAVENOUS

## 2011-07-12 MED ORDER — ISOSORBIDE MONONITRATE ER 30 MG PO TB24
30.0000 mg | ORAL_TABLET | Freq: Every day | ORAL | Status: DC
  Administered 2011-07-13 – 2011-07-14 (×2): 30 mg via ORAL
  Filled 2011-07-12 (×3): qty 1

## 2011-07-12 MED ORDER — ONDANSETRON HCL 4 MG/2ML IJ SOLN
INTRAMUSCULAR | Status: DC | PRN
  Administered 2011-07-12: 4 mg via INTRAVENOUS

## 2011-07-12 MED ORDER — NEOSTIGMINE METHYLSULFATE 1 MG/ML IJ SOLN
INTRAMUSCULAR | Status: DC | PRN
  Administered 2011-07-12: 3 mg via INTRAVENOUS

## 2011-07-12 MED ORDER — BUPIVACAINE-EPINEPHRINE 0.25% -1:200000 IJ SOLN
INTRAMUSCULAR | Status: DC | PRN
  Administered 2011-07-12: 9 mL

## 2011-07-12 MED ORDER — SIMVASTATIN 10 MG PO TABS
10.0000 mg | ORAL_TABLET | Freq: Every day | ORAL | Status: DC
  Administered 2011-07-12 – 2011-07-14 (×3): 10 mg via ORAL
  Filled 2011-07-12 (×4): qty 1

## 2011-07-12 MED ORDER — HEPARIN SODIUM (PORCINE) 5000 UNIT/ML IJ SOLN
5000.0000 [IU] | Freq: Three times a day (TID) | INTRAMUSCULAR | Status: DC
  Administered 2011-07-12 – 2011-07-15 (×9): 5000 [IU] via SUBCUTANEOUS
  Filled 2011-07-12 (×12): qty 1

## 2011-07-12 MED ORDER — ONDANSETRON HCL 4 MG/2ML IJ SOLN
4.0000 mg | Freq: Four times a day (QID) | INTRAMUSCULAR | Status: DC | PRN
  Filled 2011-07-12: qty 2

## 2011-07-12 MED ORDER — GLYCOPYRROLATE 0.2 MG/ML IJ SOLN
INTRAMUSCULAR | Status: DC | PRN
  Administered 2011-07-12: .5 mg via INTRAVENOUS
  Administered 2011-07-12: 0.2 mg via INTRAVENOUS

## 2011-07-12 MED ORDER — CEFAZOLIN SODIUM-DEXTROSE 2-3 GM-% IV SOLR
2.0000 g | Freq: Once | INTRAVENOUS | Status: AC
  Administered 2011-07-12: 2 g via INTRAVENOUS
  Filled 2011-07-12: qty 50

## 2011-07-12 MED ORDER — HYDROCODONE-ACETAMINOPHEN 5-325 MG PO TABS
ORAL_TABLET | ORAL | Status: AC
  Filled 2011-07-12: qty 2

## 2011-07-12 SURGICAL SUPPLY — 52 items
ADH SKN CLS APL DERMABOND .7 (GAUZE/BANDAGES/DRESSINGS) ×1
APL SKNCLS STERI-STRIP NONHPOA (GAUZE/BANDAGES/DRESSINGS) ×1
APPLIER CLIP LOGIC TI 5 (MISCELLANEOUS) IMPLANT
APR CLP MED LRG 33X5 (MISCELLANEOUS)
BENZOIN TINCTURE PRP APPL 2/3 (GAUZE/BANDAGES/DRESSINGS) ×2 IMPLANT
BLADE SURG ROTATE 9660 (MISCELLANEOUS) IMPLANT
CANISTER SUCTION 2500CC (MISCELLANEOUS) ×2 IMPLANT
CHLORAPREP W/TINT 26ML (MISCELLANEOUS) ×2 IMPLANT
CLOTH BEACON ORANGE TIMEOUT ST (SAFETY) ×2 IMPLANT
COVER SURGICAL LIGHT HANDLE (MISCELLANEOUS) ×2 IMPLANT
DECANTER SPIKE VIAL GLASS SM (MISCELLANEOUS) ×1 IMPLANT
DERMABOND ADVANCED (GAUZE/BANDAGES/DRESSINGS) ×1
DERMABOND ADVANCED .7 DNX12 (GAUZE/BANDAGES/DRESSINGS) IMPLANT
DEVICE SECURE STRAP 25 ABSORB (INSTRUMENTS) ×4 IMPLANT
DEVICE TROCAR PUNCTURE CLOSURE (ENDOMECHANICALS) ×2 IMPLANT
DRAPE UTILITY 15X26 W/TAPE STR (DRAPE) ×4 IMPLANT
DRSG PAD ABDOMINAL 8X10 ST (GAUZE/BANDAGES/DRESSINGS) ×1 IMPLANT
ELECT REM PT RETURN 9FT ADLT (ELECTROSURGICAL) ×2
ELECTRODE REM PT RTRN 9FT ADLT (ELECTROSURGICAL) ×1 IMPLANT
GLOVE BIO SURGEON STRL SZ7 (GLOVE) ×1 IMPLANT
GLOVE BIO SURGEON STRL SZ7.5 (GLOVE) ×1 IMPLANT
GLOVE BIOGEL PI IND STRL 7.0 (GLOVE) IMPLANT
GLOVE BIOGEL PI IND STRL 7.5 (GLOVE) IMPLANT
GLOVE BIOGEL PI INDICATOR 7.0 (GLOVE) ×3
GLOVE BIOGEL PI INDICATOR 7.5 (GLOVE) ×2
GLOVE EUDERMIC 7 POWDERFREE (GLOVE) ×2 IMPLANT
GOWN PREVENTION PLUS XLARGE (GOWN DISPOSABLE) ×2 IMPLANT
GOWN STRL NON-REIN LRG LVL3 (GOWN DISPOSABLE) ×5 IMPLANT
KIT BASIN OR (CUSTOM PROCEDURE TRAY) ×2 IMPLANT
KIT ROOM TURNOVER OR (KITS) ×2 IMPLANT
MESH PHYSIO OVAL 15X20CM (Mesh General) ×1 IMPLANT
NDL SPNL 22GX3.5 QUINCKE BK (NEEDLE) ×1 IMPLANT
NEEDLE SPNL 22GX3.5 QUINCKE BK (NEEDLE) ×2 IMPLANT
NS IRRIG 1000ML POUR BTL (IV SOLUTION) ×2 IMPLANT
PAD ARMBOARD 7.5X6 YLW CONV (MISCELLANEOUS) ×4 IMPLANT
PEN SKIN MARKING BROAD (MISCELLANEOUS) ×2 IMPLANT
SCALPEL HARMONIC ACE (MISCELLANEOUS) ×1 IMPLANT
SCISSORS LAP 5X35 DISP (ENDOMECHANICALS) IMPLANT
SET IRRIG TUBING LAPAROSCOPIC (IRRIGATION / IRRIGATOR) ×2 IMPLANT
SLEEVE ENDOPATH XCEL 5M (ENDOMECHANICALS) ×5 IMPLANT
STAPLER VISISTAT 35W (STAPLE) ×2 IMPLANT
SUT MON AB 4-0 PC3 18 (SUTURE) ×2 IMPLANT
SUT NOVA NAB GS-21 0 18 T12 DT (SUTURE) ×4 IMPLANT
SUT NOVAFIL 0 T 12 (SUTURE) ×4 IMPLANT
SUT VICRYL 0 TIES 12 18 (SUTURE) IMPLANT
TOWEL OR 17X24 6PK STRL BLUE (TOWEL DISPOSABLE) ×2 IMPLANT
TOWEL OR 17X26 10 PK STRL BLUE (TOWEL DISPOSABLE) ×2 IMPLANT
TRAY FOLEY CATH 14FRSI W/METER (CATHETERS) ×2 IMPLANT
TRAY LAPAROSCOPIC (CUSTOM PROCEDURE TRAY) ×2 IMPLANT
TROCAR XCEL NON-BLD 11X100MML (ENDOMECHANICALS) ×2 IMPLANT
TROCAR XCEL NON-BLD 5MMX100MML (ENDOMECHANICALS) ×3 IMPLANT
WATER STERILE IRR 1000ML POUR (IV SOLUTION) IMPLANT

## 2011-07-12 NOTE — Preoperative (Signed)
Beta Blockers   Reason not to administer Beta Blockers:coreg at 0400 hrs 07/12/11

## 2011-07-12 NOTE — Interval H&P Note (Signed)
History and Physical Interval Note:  07/12/2011 7:12 AM  Makayla Fischer  has presented today for surgery, with the diagnosis of painful hernia  The goals of treatment and the  various methods of treatment have been discussed with the patient and family. After consideration of risks, benefits and other options for treatment, the patient has consented to  Procedure(s) (LRB): LAPAROSCOPIC VENTRAL HERNIA (N/A) INSERTION OF MESH (N/A) as a surgical intervention .  The patients' history has been reviewed and the  patient examined today, no change in status, stable for surgery.  I have reviewed the patients' chart and labs.  Questions were answered to the patient's satisfaction.     Ernestene Mention

## 2011-07-12 NOTE — Op Note (Signed)
Patient Name:           Makayla Fischer   Date of Surgery:        07/12/2011  Pre op Diagnosis:      Ventral incisional hernia  Post op Diagnosis:    same  Procedure:                 Laparoscopic repair of ventral incisional hernia with 15 cm x 20 cm Physio mesh  Surgeon:                     Angelia Mould. Derrell Lolling, M.D., FACS  Assistant:                      none  Operative Indications:   This is a 51 year old Caucasian female with a history of laparoscopic tubal ligation in the past. Morbidities include obesity, tobacco abuse, chronic pain syndrome on methadone, hypertension, coronary artery disease status post myocardial infarction with preserved left ventricular function. She also has a significant fatty infiltration of the liver and mildly elevated liver function test. She has developed a hernia around her umbilicus where she had her incision for her tubal ligation. This has become painful. It is reducible when supine and, but it takes a fair amount of effort and force. The defect is probably 3-4 cm in size and the hernia sac is about 8 cm in size. She has been counseled as an outpatient and is brought to the operating room electively.  Operative Findings:       The hernia defect was approximately 4 cm, but the hernia sac was probably is 7 or 8 cm in size. I debrided the hernia sac in hopes of reducing seroma formation.  Procedure in Detail:          Following the induction of general endotracheal anesthesia a Foley catheter was placed. Intravenous antibiotics were given. The abdomen was prepped and draped in a sterile fashion. Surgical time out was performed. 0.5% Marcaine with epinephrine was used as a local infiltration anesthetic. A 5 mm optical trocar was placed in the left subcostal position.  Trocar entry was atraumatic. Pneumoperitoneum was created. We placed an 11 mm trocar in the left flank and a 5 mm trocar and left lower quadrant. Ultimately we placed two 5 mm trocars in the right  flank as well. We were able to reduce the hernia contents. There were no adhesions in the hernia sac. The hernia sac and debrided with the harmonic scalpel and removed and  the hernia sac and sent for pathologic examination. Using a spinal needle I mapped out the hernia defect and then mapped out a 5 cm overlap. I used a 15 cm. X  20 cm piece of physio-mesh. It was brought to the operative field.  I marked a  template on the abdominal wall using a marking pen and created a template for 8 sutures. I placed 8 equidistant sutures of #1 Novofil in the mesh. I inserted the mesh and positioned it. I made 8 puncture wounds at the 8 planned suture fixation sites and drew the sutures up through this, being careful to take about a 1 cm bite of  fascia. A little bit of bleeding but not much. After all 8 Sutures were placed to lift the mesh up and it was positioned very nicely.  I tied the  sutures. I then used a secure strap device and fired about 65 of these suture fixation tacks  into the mesh in a double crown technique. I was very careful not to leave any gaps in the mesh. This was inspected 3 or 4 times and it looked good. This did not cause any bleeding. I checked for complications and injury to the omentum and intestine looks fine. The liver did look a little bit pale and a little bit granular consistent with her hepatic steatosis. The pneumoperitoneum was released the trocars were removed. Skin incisions were closed with subcuticular sutures of 4-0 Monocryl and Dermabond. Velcro Binder was placed and the patient taken to recovery in stable condition. There were no complications. EBL 15 cc. Counts correct.     Angelia Mould. Derrell Lolling, M.D., FACS General and Minimally Invasive Surgery Breast and Colorectal Surgery  07/12/2011 9:21 AM

## 2011-07-12 NOTE — Transfer of Care (Signed)
Immediate Anesthesia Transfer of Care Note  Patient: Makayla Fischer  Procedure(s) Performed: Procedure(s) (LRB): LAPAROSCOPIC VENTRAL HERNIA (N/A) INSERTION OF MESH (N/A)  Patient Location: PACU  Anesthesia Type: General  Level of Consciousness: awake, alert  and oriented  Airway & Oxygen Therapy: Patient Spontanous Breathing and Patient connected to nasal cannula oxygen  Post-op Assessment: Report given to PACU RN, Post -op Vital signs reviewed and stable and Patient moving all extremities  Post vital signs: Reviewed and stable  Complications: No apparent anesthesia complications

## 2011-07-12 NOTE — Anesthesia Postprocedure Evaluation (Signed)
  Anesthesia Post-op Note  Patient: Makayla Fischer  Procedure(s) Performed: Procedure(s) (LRB): LAPAROSCOPIC VENTRAL HERNIA (N/A) INSERTION OF MESH (N/A)  Patient Location: PACU  Anesthesia Type: General  Level of Consciousness: awake  Airway and Oxygen Therapy: Patient Spontanous Breathing  Post-op Pain: mild  Post-op Assessment: Post-op Vital signs reviewed and Patient's Cardiovascular Status Stable  Post-op Vital Signs: stable  Complications: No apparent anesthesia complications

## 2011-07-12 NOTE — Anesthesia Preprocedure Evaluation (Addendum)
Anesthesia Evaluation  Patient identified by MRN, date of birth, ID band Patient awake    Reviewed: Allergy & Precautions, H&P , NPO status , Patient's Chart, lab work & pertinent test results  Airway Mallampati: II TM Distance: >3 FB Neck ROM: Full    Dental   Pulmonary shortness of breath, COPDCurrent Smoker,  + rhonchi         Cardiovascular hypertension, + CAD and + Past MI     Neuro/Psych Anxiety Depression  Neuromuscular disease    GI/Hepatic GERD-  ,(+)     substance abuse   ,   Endo/Other    Renal/GU      Musculoskeletal  (+) Fibromyalgia -  Abdominal Normal abdominal exam  (+) + obese,   Peds  Hematology   Anesthesia Other Findings   Reproductive/Obstetrics                          Anesthesia Physical Anesthesia Plan  ASA: III  Anesthesia Plan: General   Post-op Pain Management:    Induction: Intravenous  Airway Management Planned: Oral ETT  Additional Equipment:   Intra-op Plan:   Post-operative Plan: Extubation in OR  Informed Consent: I have reviewed the patients History and Physical, chart, labs and discussed the procedure including the risks, benefits and alternatives for the proposed anesthesia with the patient or authorized representative who has indicated his/her understanding and acceptance.     Plan Discussed with: CRNA and Surgeon  Anesthesia Plan Comments:         Anesthesia Quick Evaluation

## 2011-07-13 NOTE — Progress Notes (Signed)
1 Day Post-Op  Subjective: C/o pain. Tolerating liquids  Objective: Vital signs in last 24 hours: Temp:  [97.2 F (36.2 C)-98.3 F (36.8 C)] 98.3 F (36.8 C) (06/15 0527) Pulse Rate:  [64-76] 68  (06/15 0527) Resp:  [18-22] 22  (06/15 0527) BP: (107-130)/(52-72) 124/64 mmHg (06/15 0527) SpO2:  [92 %-97 %] 92 % (06/15 0527) Weight:  [197 lb 14 oz (89.756 kg)] 197 lb 14 oz (89.756 kg) (06/14 1215) Last BM Date: 07/11/11  Intake/Output from previous day: 06/14 0701 - 06/15 0700 In: 2027 [P.O.:480; I.V.:1547] Out: 3050 [Urine:3050] Intake/Output this shift:    General appearance: alert, cooperative and no distress Resp: clear to auscultation bilaterally Cardio: regular rate and rhythm, S1, S2 normal, no murmur, click, rub or gallop GI: soft, tender diffusely, no peritonitis, incisions dimpled but no infection  Lab Results:   Basename 07/12/11 1251  WBC 7.7  HGB 12.4  HCT 35.7*  PLT 184   BMET  Basename 07/12/11 1251  NA --  K --  CL --  CO2 --  GLUCOSE --  BUN --  CREATININE 0.59  CALCIUM --   PT/INR No results found for this basename: LABPROT:2,INR:2 in the last 72 hours ABG No results found for this basename: PHART:2,PCO2:2,PO2:2,HCO3:2 in the last 72 hours  Studies/Results: No results found.  Anti-infectives: Anti-infectives     Start     Dose/Rate Route Frequency Ordered Stop   07/12/11 1400   ceFAZolin (ANCEF) IVPB 2 g/50 mL premix        2 g 100 mL/hr over 30 Minutes Intravenous  Once 07/12/11 1219 07/12/11 1342   07/11/11 1443   ceFAZolin (ANCEF) IVPB 2 g/50 mL premix        2 g 100 mL/hr over 30 Minutes Intravenous 60 min pre-op 07/11/11 1443 07/12/11 0739          Assessment/Plan: s/p Procedure(s) (LRB): LAPAROSCOPIC VENTRAL HERNIA (N/A) INSERTION OF MESH (N/A) mobilize, pain control, diet as tolerated.  Not ready for discharge due to pain  LOS: 1 day    Lodema Pilot DAVID 07/13/2011

## 2011-07-14 LAB — CBC
HCT: 37.8 % (ref 36.0–46.0)
MCHC: 34.7 g/dL (ref 30.0–36.0)
RDW: 12.5 % (ref 11.5–15.5)
WBC: 8.1 10*3/uL (ref 4.0–10.5)

## 2011-07-14 NOTE — Progress Notes (Signed)
2 Days Post-Op  Subjective: Still complains of pain.  Tolerating regular diet.  Objective: Vital signs in last 24 hours: Temp:  [97.7 F (36.5 C)-98.8 F (37.1 C)] 98.7 F (37.1 C) (06/16 0981) Pulse Rate:  [63-76] 69  (06/16 0638) Resp:  [19-20] 19  (06/16 1914) BP: (100-123)/(62-75) 106/64 mmHg (06/16 0638) SpO2:  [92 %-96 %] 96 % (06/16 7829) Last BM Date: 07/11/11  Intake/Output from previous day: 06/15 0701 - 06/16 0700 In: 1080 [P.O.:1080] Out: 1850 [Urine:1850] Intake/Output this shift:    General appearance: alert, cooperative and no distress Resp: nonlabored GI: soft, appropriate tenderness, mild distension, wounds without infection, no peritioneal signs, +BS  Lab Results:   [facility] 07/14/11 0616 07/12/11 1251  WBC 8.1 7.7  HGB 13.1 12.4  HCT 37.8 35.7*  PLT 178 184   BMET  Basename 07/12/11 1251  NA --  K --  CL --  CO2 --  GLUCOSE --  BUN --  CREATININE 0.59  CALCIUM --   PT/INR No results found for this basename: LABPROT:2,INR:2 in the last 72 hours ABG No results found for this basename: PHART:2,PCO2:2,PO2:2,HCO3:2 in the last 72 hours  Studies/Results: No results found.  Anti-infectives: Anti-infectives     Start     Dose/Rate Route Frequency Ordered Stop   07/12/11 1400   ceFAZolin (ANCEF) IVPB 2 g/50 mL premix        2 g 100 mL/hr over 30 Minutes Intravenous  Once 07/12/11 1219 07/12/11 1342   07/11/11 1443   ceFAZolin (ANCEF) IVPB 2 g/50 mL premix        2 g 100 mL/hr over 30 Minutes Intravenous 60 min pre-op 07/11/11 1443 07/12/11 0739          Assessment/Plan: s/p Procedure(s) (LRB): LAPAROSCOPIC VENTRAL HERNIA (N/A) INSERTION OF MESH (N/A) still complains of pain and doesnt feel as though she is ready for discharge.  No evidence of any postoperative complications. Mobilize today and should be okay for discharge tomorrow.  LOS: 2 days    Lodema Pilot DAVID 07/14/2011

## 2011-07-15 ENCOUNTER — Encounter (HOSPITAL_COMMUNITY): Payer: Self-pay | Admitting: General Surgery

## 2011-07-15 MED ORDER — HYDROCODONE-ACETAMINOPHEN 5-325 MG PO TABS: 1.0000 | ORAL_TABLET | ORAL | Status: AC | PRN

## 2011-07-15 NOTE — Progress Notes (Signed)
3 Days Post-Op  Subjective: Ate breakfast, ready to go home  Objective: Vital signs in last 24 hours: Temp:  [98.1 F (36.7 C)-98.6 F (37 C)] 98.4 F (36.9 C) (06/17 0500) Pulse Rate:  [71-76] 73  (06/17 0500) Resp:  [18] 18  (06/17 0500) BP: (110-121)/(60-68) 121/68 mmHg (06/17 0500) SpO2:  [91 %-94 %] 91 % (06/17 0500) Last BM Date: 07/11/11  Intake/Output from previous day: 06/16 0701 - 06/17 0700 In: 1080 [P.O.:1080] Out: 1750 [Urine:1750] Intake/Output this shift:    General appearance: alert and cooperative Resp: clear to auscultation bilaterally GI: soft, active BS, inciisons all CDI  Lab Results:   [facility] 07/14/11 0616 07/12/11 1251  WBC 8.1 7.7  HGB 13.1 12.4  HCT 37.8 35.7*  PLT 178 184   BMET  Basename 07/12/11 1251  NA --  K --  CL --  CO2 --  GLUCOSE --  BUN --  CREATININE 0.59  CALCIUM --   PT/INR No results found for this basename: LABPROT:2,INR:2 in the last 72 hours ABG No results found for this basename: PHART:2,PCO2:2,PO2:2,HCO3:2 in the last 72 hours  Studies/Results: No results found.  Anti-infectives: Anti-infectives     Start     Dose/Rate Route Frequency Ordered Stop   07/12/11 1400   ceFAZolin (ANCEF) IVPB 2 g/50 mL premix        2 g 100 mL/hr over 30 Minutes Intravenous  Once 07/12/11 1219 07/12/11 1342   07/11/11 1443   ceFAZolin (ANCEF) IVPB 2 g/50 mL premix        2 g 100 mL/hr over 30 Minutes Intravenous 60 min pre-op 07/11/11 1443 07/12/11 0739          Assessment/Plan: s/p Procedure(s) (LRB): LAPAROSCOPIC VENTRAL HERNIA (N/A) INSERTION OF MESH (N/A) Discharge  LOS: 3 days    Makayla Fischer E 07/15/2011

## 2011-07-15 NOTE — Progress Notes (Signed)
Discharge instructions reviewed with pt and prescription given.  Pt verbalized understanding and had no questions.  Pt discharged in stable condition via wheelchair with family.  Makayla Fischer   

## 2011-07-29 ENCOUNTER — Encounter (INDEPENDENT_AMBULATORY_CARE_PROVIDER_SITE_OTHER): Payer: Medicare Other | Admitting: General Surgery

## 2011-08-02 ENCOUNTER — Telehealth (INDEPENDENT_AMBULATORY_CARE_PROVIDER_SITE_OTHER): Payer: Self-pay

## 2011-08-02 NOTE — Telephone Encounter (Signed)
LMOV for Norco 5/325 #30 w/ no refills.  Pt is aware.

## 2011-08-02 NOTE — Telephone Encounter (Signed)
Pt called to report that she has developed swelling at her surgical site.  She is not having additional pain, no fever or chills.  Dr. Abbey Chatters advised she wear her abdominal binder and reschedule her post op appt.  Pls call with an appointment.

## 2011-08-05 ENCOUNTER — Telehealth (INDEPENDENT_AMBULATORY_CARE_PROVIDER_SITE_OTHER): Payer: Self-pay | Admitting: General Surgery

## 2011-08-05 NOTE — Telephone Encounter (Signed)
Tried to call back on [phone] and cell [phone], had to leave call back number [phone] due to both mail boxes being full for this patient. Patient needs to be made aware of appointment made on 08/09/11 at 4:15 per Dr. Derrell Lolling. Had lap ventral hernia repair by Derrell Lolling on 07/12/11. Was unable to come to prior post op appointment due to death in the family.

## 2011-08-09 ENCOUNTER — Ambulatory Visit (INDEPENDENT_AMBULATORY_CARE_PROVIDER_SITE_OTHER): Payer: Medicare Other | Admitting: General Surgery

## 2011-08-09 ENCOUNTER — Encounter (INDEPENDENT_AMBULATORY_CARE_PROVIDER_SITE_OTHER): Payer: Self-pay | Admitting: General Surgery

## 2011-08-09 VITALS — BP 112/62 | HR 72 | Temp 98.2°F | Resp 18 | Ht 64.0 in | Wt 195.4 lb

## 2011-08-09 DIAGNOSIS — K432 Incisional hernia without obstruction or gangrene: Secondary | ICD-10-CM

## 2011-08-09 NOTE — Patient Instructions (Signed)
You should take a 45 minute walk every day.  You should stop smoking.  No sports or lifting more than 20 pounds.  The fluid collection under your skin should resolve in one to 2 months.  Return to see Dr. Derrell Lolling in 2 months.

## 2011-08-09 NOTE — Progress Notes (Signed)
Subjective:     Patient ID: Makayla Fischer, female   DOB: October 12, 1960, 51 y.o.   MRN: [id number]  HPI This patient underwent laparoscopic repair of ventral hernia with a 15 x 20 cm piece of Physio mesh. She had a fair-sized hernia sac. She is making reasonable progress. Appetite normal. Was constipated but  her bowel function is now back to normal. Not much pain. She continues to smoke, and I encouraged her to quit.  Review of Systems     Objective:   Physical Exam Patient is pleasant. In no distress. Strong odor of tobacco.  Examined standing. The hernia repair is intact. She has a seroma in the central portion of the wound and the hernia sac. This is 6-8 cm in size. Nontender. No signs of infection.    Assessment:     Ventral hernia, recovering uneventfully following laparoscopic repair with mesh  Wound seroma, asymptomatic.  Multiple medical problems.    Plan:     No sports or heavy lifting for 2-3 more weeks.  She was informed about the seroma, and I told her that it should eventually resolve.  Return to see me in 2 months for a wound check.  She was encouraged to stop smoking.   Angelia Mould. Derrell Lolling, M.D., [facility] General and Minimally invasive Surgery Breast and Colorectal Surgery Office:   [phone] Pager:   [phone]

## 2011-08-21 ENCOUNTER — Telehealth (INDEPENDENT_AMBULATORY_CARE_PROVIDER_SITE_OTHER): Payer: Self-pay | Admitting: General Surgery

## 2011-08-21 NOTE — Telephone Encounter (Signed)
Called patient to advise Dr. Derrell Lolling stated he would not issue additional pain medication at this time. Advised the patient that due to how far out her surgery was she can try Aleve or Advil to see if that will help her. Otherwise she would have to be seen by Dr. Derrell Lolling in order for her to be examined and to then determine if pain medication was necessary. Patient agreed.  [facility] pharmacy in [place] [phone]) and advised Dr. Derrell Lolling rejected the request for additional Norco to be issued to the patient. Patient more than five weeks post op. Ventral hernia repair 07/12/11.

## 2011-10-08 ENCOUNTER — Ambulatory Visit (INDEPENDENT_AMBULATORY_CARE_PROVIDER_SITE_OTHER): Payer: Medicare Other | Admitting: General Surgery

## 2011-10-08 ENCOUNTER — Encounter (INDEPENDENT_AMBULATORY_CARE_PROVIDER_SITE_OTHER): Payer: Self-pay | Admitting: General Surgery

## 2011-10-08 VITALS — BP 120/78 | HR 68 | Temp 96.4°F | Resp 18 | Ht 64.0 in | Wt 187.0 lb

## 2011-10-08 DIAGNOSIS — K432 Incisional hernia without obstruction or gangrene: Secondary | ICD-10-CM

## 2011-10-08 NOTE — Patient Instructions (Signed)
Your examination today reveals that the hernia repair is solid and intact.  You have a persistent seroma, and we aspirated about 40 cc of bloody watery fluid from this and the seroma collapsed.  Return to see Dr. Derrell Lolling in 10 weeks to make sure that the fluid does not reaccumulate.

## 2011-10-08 NOTE — Progress Notes (Signed)
Patient ID: Makayla Fischer, female   DOB: [birth year], 51 y.o.   MRN: [id number]  History: This patient underwent laparoscopic repair of ventral incisional hernia on 07/12/2011. She had a moderate sized defect. We used a 15 x 20 cm piece of PhysioMesh.  . When I last saw her 2 months ago she was doing well but had a seroma. She continues to smoke. She states that she still has a lump but otherwise feels fine.  Exam: Patient looks well. No distress. Abdomen is somewhat obese. Soft. Examined supine and standing. The hernia repair is intact. No sign of infection. She has a well-defined, mobile seroma at  6 or 7 cm in size. I ultrasounded this and found that it was a cystic fluid collection. After alcohol prep, 1% Xylocaine with epinephrine local, I aspirated 40 cc of bloody watery fluid which was odorless. The palpable mass collapsed. She tolerated this well. Neosporin ointment and bandage were placed.  Assessment: Ventral incisional hernia, status post laparoscopic repair with mesh 07/12/2011 Wound seroma, chronic, aspirated today  Plan: The patient was advised regarding the aspiration of the seroma. Return to see me in 10 weeks to make sure the seroma does not recur. Discontinue smoking.   Angelia Mould. Derrell Lolling, M.D., [facility] General and Minimally invasive Surgery Breast and Colorectal Surgery Office:   [phone] Pager:   [phone]

## 2011-12-19 ENCOUNTER — Encounter (INDEPENDENT_AMBULATORY_CARE_PROVIDER_SITE_OTHER): Payer: Medicare Other | Admitting: General Surgery

## 2012-02-18 ENCOUNTER — Encounter (INDEPENDENT_AMBULATORY_CARE_PROVIDER_SITE_OTHER): Payer: Medicare Other | Admitting: General Surgery

## 2012-03-12 ENCOUNTER — Encounter (INDEPENDENT_AMBULATORY_CARE_PROVIDER_SITE_OTHER): Payer: Medicare Other | Admitting: General Surgery

## 2012-04-07 ENCOUNTER — Encounter (INDEPENDENT_AMBULATORY_CARE_PROVIDER_SITE_OTHER): Payer: Medicare Other | Admitting: General Surgery

## 2012-04-15 ENCOUNTER — Encounter (INDEPENDENT_AMBULATORY_CARE_PROVIDER_SITE_OTHER): Payer: Self-pay | Admitting: General Surgery

## 2012-06-23 ENCOUNTER — Encounter (INDEPENDENT_AMBULATORY_CARE_PROVIDER_SITE_OTHER): Payer: Medicare Other | Admitting: General Surgery

## 2012-07-30 ENCOUNTER — Encounter (INDEPENDENT_AMBULATORY_CARE_PROVIDER_SITE_OTHER): Payer: Medicare Other | Admitting: General Surgery

## 2012-08-04 ENCOUNTER — Encounter (INDEPENDENT_AMBULATORY_CARE_PROVIDER_SITE_OTHER): Payer: Medicare Other | Admitting: General Surgery

## 2012-09-26 IMAGING — CR DG CHEST 2V
2 series · 2 of 2 positions shown · non-contrast
Comparison: Two-view chest x-ray 09/02/2007 Rafe Jumper.

CLINICAL DATA: Preoperative respiratory evaluation prior to
abdominal wall hernia repair.  Long-time smoker.  History of
hypertension.

CHEST - 2 VIEW

[view not recorded (1 of 2)]
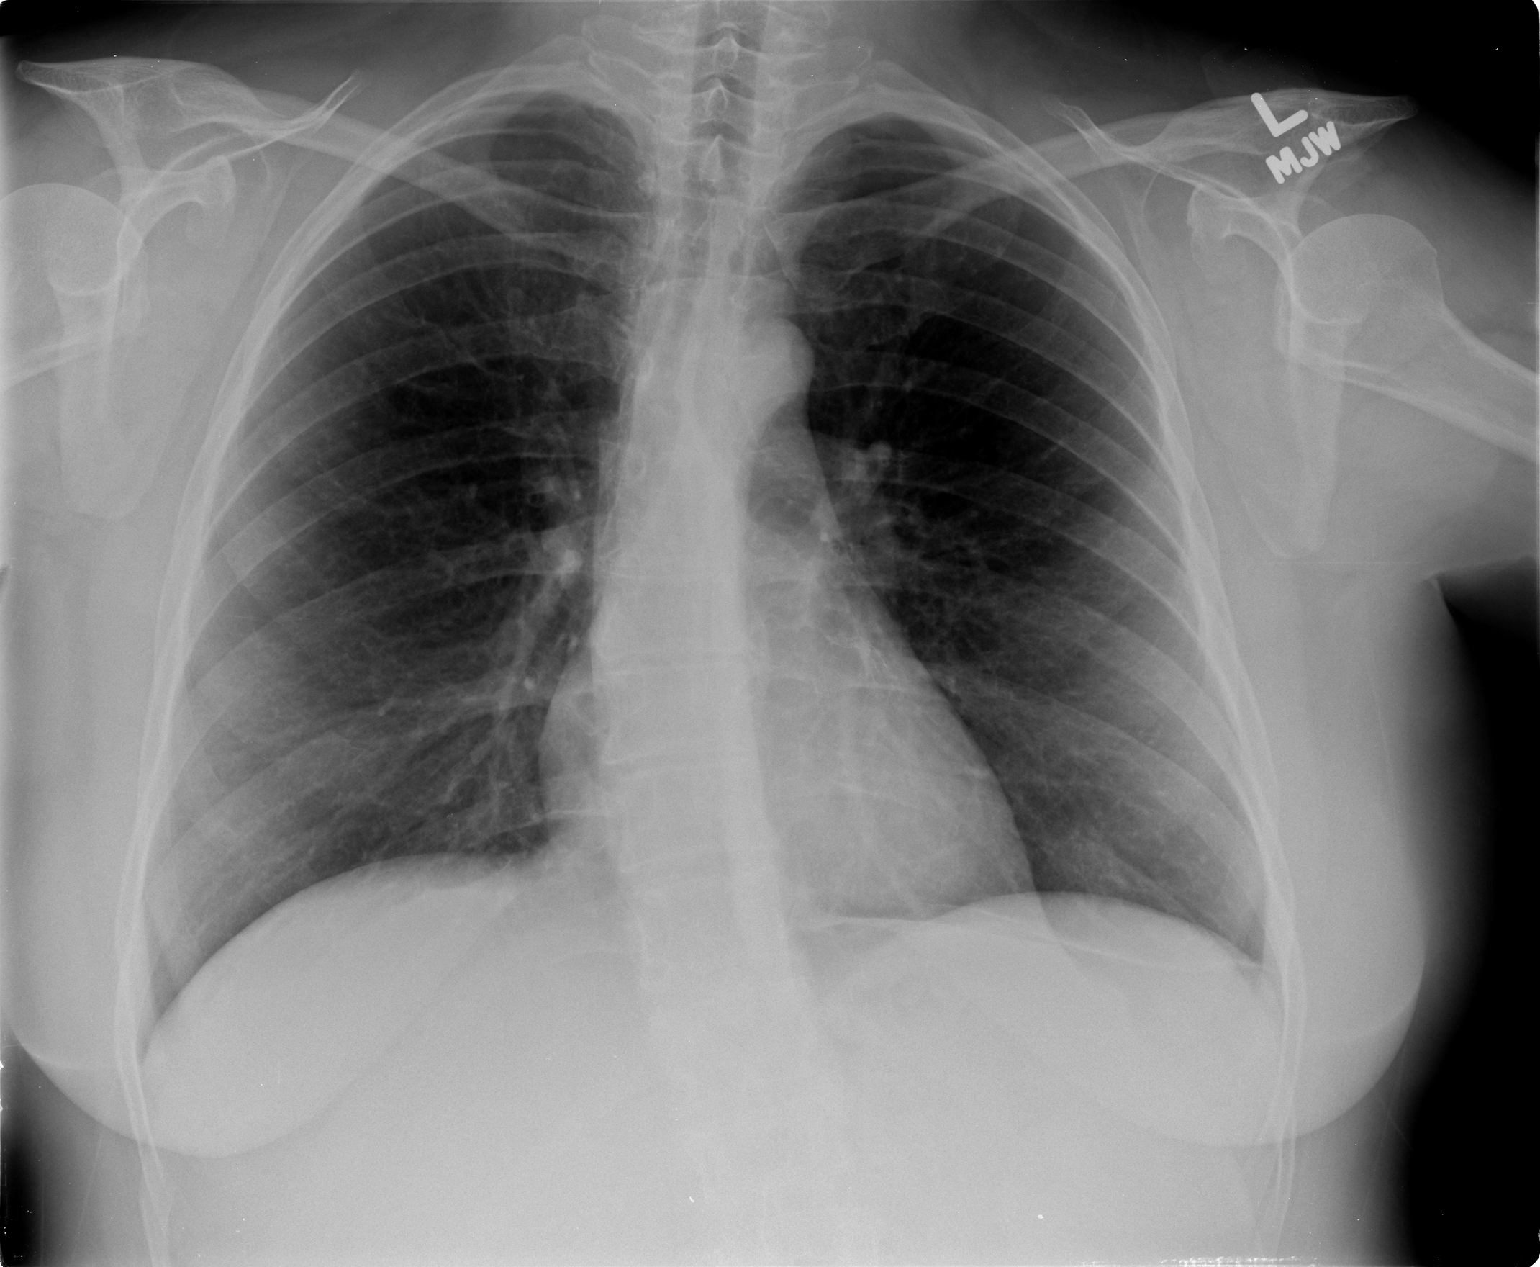

[view not recorded (2 of 2)]
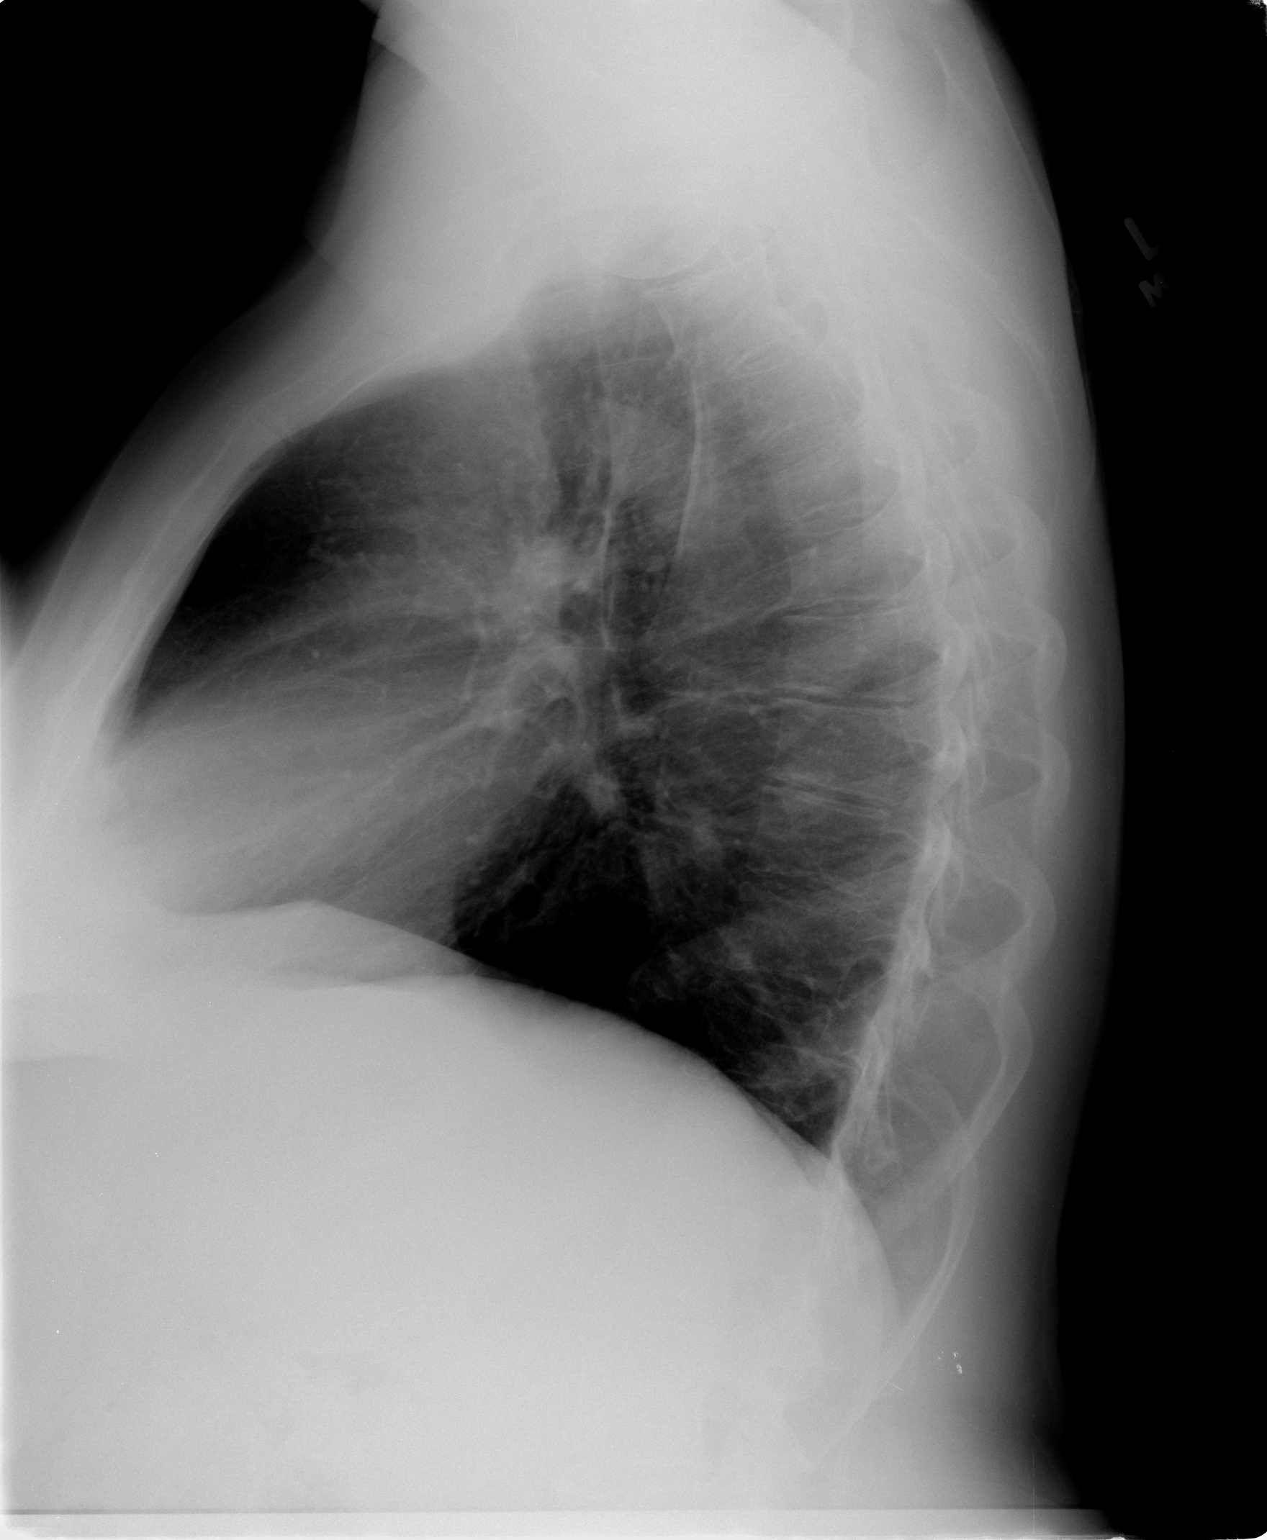

[2 of 2 positions shown; findings below may reference images not displayed]

FINDINGS: Cardiomediastinal silhouette unremarkable, unchanged.
Linear scarring in the left lower lobe, unchanged.  Lungs otherwise
clear.  Bronchovascular markings normal.  Pulmonary vascularity
normal.  No pleural effusions.  Mild degenerative changes involving
the mid thoracic spine and thoracic scoliosis convex right.  No
significant interval change.
IMPRESSION: No acute cardiopulmonary disease.  Scarring in the left lower lobe.
Stable examination.

## 2013-06-17 ENCOUNTER — Other Ambulatory Visit: Payer: Self-pay | Admitting: *Deleted

## 2013-06-17 DIAGNOSIS — I83893 Varicose veins of bilateral lower extremities with other complications: Secondary | ICD-10-CM

## 2013-08-09 ENCOUNTER — Encounter: Payer: Self-pay | Admitting: Vascular Surgery

## 2013-08-10 ENCOUNTER — Encounter: Payer: Medicare Other | Admitting: Vascular Surgery

## 2013-08-10 ENCOUNTER — Encounter (HOSPITAL_COMMUNITY): Payer: Medicare Other

## 2013-10-11 ENCOUNTER — Encounter: Payer: Self-pay | Admitting: Vascular Surgery

## 2013-10-12 ENCOUNTER — Ambulatory Visit (HOSPITAL_COMMUNITY)
Admission: RE | Admit: 2013-10-12 | Discharge: 2013-10-12 | Disposition: A | Discharge status: Home / Self Care | Payer: Medicare HMO | Source: Ambulatory Visit | Attending: Vascular Surgery | Admitting: Vascular Surgery

## 2013-10-12 ENCOUNTER — Encounter: Payer: Self-pay | Admitting: Vascular Surgery

## 2013-10-12 ENCOUNTER — Ambulatory Visit (INDEPENDENT_AMBULATORY_CARE_PROVIDER_SITE_OTHER): Payer: Medicare HMO | Admitting: Vascular Surgery

## 2013-10-12 VITALS — BP 101/70 | HR 83 | Resp 16 | Ht 64.0 in | Wt 160.0 lb

## 2013-10-12 DIAGNOSIS — I83893 Varicose veins of bilateral lower extremities with other complications: Secondary | ICD-10-CM

## 2013-10-12 DIAGNOSIS — I8390 Asymptomatic varicose veins of unspecified lower extremity: Secondary | ICD-10-CM

## 2013-10-12 HISTORY — DX: Varicose veins of bilateral lower extremities with other complications: I83.893

## 2013-10-12 HISTORY — DX: Asymptomatic varicose veins of unspecified lower extremity: I83.90

## 2013-10-12 NOTE — Progress Notes (Signed)
Subjective:     Patient ID: Makayla Fischer, female   DOB: 02-27-1960, 53 y.o.   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 She had an episode of thrombophlebitis in the right medial calf a few months ago. She has had no history of DVT, stasis ulcers, or bleeding. She has not worn elastic compression stockings. The left and right thigh and calf become heavy achy and throbbing as the day goes by and she develops swelling in the ankles.  Past Medical History  Diagnosis Date  . Chronic pain   . Hyperlipidemia   . GERD (gastroesophageal reflux disease)   . Depression   . CAD (coronary artery disease)   . Myocardial infarction 02/2008  . Osteoporosis   . Substance abuse     drugs and alcohol  . Incisional hernia     with obstruction  . Shortness of breath   . Arthritis     all over, back, knees   . Anxiety     h/o panic attacks   . Neuromuscular disorder     neuropathy  . Fibromyalgia   . Fatty liver     by 01/15/11 CT scan  . Anginal pain   . Hypertension     sees Dr. Posey Rea, stress test in 01/2011    History  Substance Use Topics  . Smoking status: Current Every Day Smoker -- 0.50 packs/day for 28 years    Types: Cigarettes  . Smokeless tobacco: Never Used  . Alcohol Use: Yes     Comment: 6 pack of beer / week    Family History  Problem Relation Age of Onset  . Diabetes Mother   . Hyperlipidemia Mother   . Hypertension Mother   . Heart disease Mother   . Cancer Father     lymphoma  . Anesthesia problems Neg Hx     Allergies  Allergen Reactions  . Septra [Bactrim] Shortness Of Breath and Itching    Current outpatient prescriptions:amLODipine (NORVASC) 10 MG tablet, Take 10 mg by mouth daily after lunch daily after lunch. , Disp: , Rfl: ;  aspirin 81 MG tablet, Take 81 mg by mouth daily.  , Disp: , Rfl: ;  buPROPion (WELLBUTRIN SR) 150 MG 12 hr tablet, Take 150 mg by mouth  2 (two) times daily. , Disp: , Rfl: ;  carvedilol (COREG) 6.25 MG tablet, Take 6.25 mg by mouth 2 (two) times daily with a meal. , Disp: , Rfl:  chlorthalidone (HYGROTON) 50 MG tablet, Take 50 mg by mouth daily. , Disp: , Rfl: ;  clonazePAM (KLONOPIN) 1 MG tablet, Take 1 mg by mouth 2 (two) times daily as needed. For anxiety, Disp: , Rfl: ;  cyclobenzaprine (FLEXERIL) 10 MG tablet, Take 10 mg by mouth 3 (three) times daily as needed. For muscle pain, Disp: , Rfl: ;  gabapentin (NEURONTIN) 300 MG capsule, Take 300 mg by mouth 3 (three) times daily., Disp: , Rfl:  isosorbide mononitrate (IMDUR) 30 MG 24 hr tablet, Take 30 mg by mouth daily after lunch daily after lunch. , Disp: , Rfl: ;  lisinopril (PRINIVIL,ZESTRIL) 20 MG tablet, Take 20 mg by mouth daily after lunch daily after lunch. , Disp: , Rfl: ;  methadone (DOLOPHINE) 10 MG tablet, Take 20 mg by mouth every 6 (six) hours. Three four times a day, Disp: , Rfl:  Multiple Vitamins-Minerals (MULTIVITAMIN WITH MINERALS) tablet, Take 1 tablet by mouth daily.  ,  Disp: , Rfl: ;  nitroGLYCERIN (NITROSTAT) 0.4 MG SL tablet, Place 0.4 mg under the tongue every 5 (five) minutes as needed. For chest pain, Disp: , Rfl: ;  omeprazole (PRILOSEC) 20 MG capsule, Take 20 mg by mouth at bedtime. , Disp: , Rfl: ;  pravastatin (PRAVACHOL) 40 MG tablet, Take 40 mg by mouth daily after lunch daily after lunch. , Disp: , Rfl:  traMADol (ULTRAM) 50 MG tablet, Take 50 mg by mouth every 6 (six) hours as needed. Maximum dose= 8 tablets per day. For pain, Disp: , Rfl:   BP 101/70  Pulse 83  Resp 16  Ht 5\' 4"  (1.626 m)  Wt 160 lb (72.576 kg)  BMI 27.45 kg/m2  Body mass index is 27.45 kg/(m^2).          Review of Systems complains of occasional chest discomfort coming leg pain while lying flat, swelling, weakness in arms and legs, numbness in arms legs, dizziness, and blood in stool from hemorrhoids. Also has history of IV drug abuse. Other systems negative complete  review of systems other than history of myocardial infarction in the past     Objective:   Physical Exam BP 101/70  Pulse 83  Resp 16  Ht 5\' 4"  (1.626 m)  Wt 160 lb (72.576 kg)  BMI 27.45 kg/m2  Gen.-alert and oriented x3 in no apparent distress HEENT normal for age Lungs no rhonchi or wheezing Cardiovascular regular rhythm no murmurs carotid pulses 3+ palpable no bruits audible Abdomen soft nontender no palpable masses Musculoskeletal free of  major deformities Skin clear -no rashes Neurologic normal Lower extremities 3+ femoral and dorsalis pedis pulses palpable bilaterally with 1+ edema bilaterally Right leg with bulging varicosities in the medial thigh medial calf anterior thigh and lateral calf with prominent telangiectasia in the ankle area bilaterally. No active ulcer noted. Left leg with bulging varicosities and medial thigh medial calf and prominent telangiectasia in the ankle area with no ulceration.  Today I ordered a venous duplex exam of the right leg which are reviewed and interpreted. There is gross reflux throughout the right great saphenous vein supplying these bulging varicosities. There is no DVT.       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 2 documented gross reflux right great saphenous vein. Venous duplex on left side not performed yet. Symptoms are affecting patient's daily living.    Plan:        #1 long leg elastic compression stockings 20-30 mm gradient #2 elevate legs as much as possible #3 ibuprofen daily on a regular basis for pain #4 return in 3 months-if no significant improvement then she will need #1 laser ablation right great saphenous vein followed by #2 laser ablation left great saphenous vein if found to be needed followed by a 3 month waiting period. We'll then see if patient needs stab phlebectomy or sclerotherapy

## 2014-01-03 ENCOUNTER — Other Ambulatory Visit: Payer: Self-pay | Admitting: *Deleted

## 2014-01-03 DIAGNOSIS — I83892 Varicose veins of left lower extremities with other complications: Secondary | ICD-10-CM

## 2014-01-10 ENCOUNTER — Encounter: Payer: Self-pay | Admitting: Vascular Surgery

## 2014-01-11 ENCOUNTER — Ambulatory Visit: Payer: Medicare HMO | Admitting: Vascular Surgery

## 2014-01-11 ENCOUNTER — Encounter (HOSPITAL_COMMUNITY): Payer: Medicare HMO

## 2014-02-07 ENCOUNTER — Encounter: Payer: Self-pay | Admitting: Vascular Surgery

## 2014-02-08 ENCOUNTER — Encounter: Payer: Self-pay | Admitting: Vascular Surgery

## 2014-02-08 ENCOUNTER — Ambulatory Visit (HOSPITAL_COMMUNITY)
Admission: RE | Admit: 2014-02-08 | Discharge: 2014-02-08 | Disposition: A | Discharge status: Home / Self Care | Payer: Medicare HMO | Source: Ambulatory Visit | Attending: Vascular Surgery | Admitting: Vascular Surgery

## 2014-02-08 ENCOUNTER — Ambulatory Visit (INDEPENDENT_AMBULATORY_CARE_PROVIDER_SITE_OTHER): Payer: Medicare HMO | Admitting: Vascular Surgery

## 2014-02-08 VITALS — BP 110/72 | HR 82 | Resp 14 | Ht 64.0 in | Wt 165.0 lb

## 2014-02-08 DIAGNOSIS — I83891 Varicose veins of right lower extremities with other complications: Secondary | ICD-10-CM

## 2014-02-08 DIAGNOSIS — I83892 Varicose veins of left lower extremities with other complications: Secondary | ICD-10-CM

## 2014-02-08 NOTE — Progress Notes (Signed)
Subjective:     Patient ID: Makayla Fischer, female   DOB: August 04, 1960, 54 y.o.   MRN: [id number]  HPI this 54 year old female returns for continued follow-up regarding painful varicosities in both legs right worse than left. She also has significant swelling as the day progresses right worse than left. She has tried long-leg elastic compression stockings, elevation, and ibuprofen with no success. He is to have pain and swelling which are affecting his her daily living. She has no history of DVT. She does have coronary artery disease and has had previous cardiac stents on 2 occasions and is scheduled to see her cardiologist tomorrow. She is not having active chest pain at this time.  Past Medical History  Diagnosis Date  . Chronic pain   . Hyperlipidemia   . GERD (gastroesophageal reflux disease)   . Depression   . CAD (coronary artery disease)   . Myocardial infarction 02/2008  . Osteoporosis   . Substance abuse     drugs and alcohol  . Incisional hernia     with obstruction  . Shortness of breath   . Arthritis     all over, back, knees   . Anxiety     h/o panic attacks   . Neuromuscular disorder     neuropathy  . Fibromyalgia   . Fatty liver     by 01/15/11 CT scan  . Anginal pain   . Hypertension     sees Dr. Posey Rea, stress test in 01/2011    History  Substance Use Topics  . Smoking status: Current Every Day Smoker -- 0.50 packs/day for 28 years    Types: Cigarettes  . Smokeless tobacco: Never Used  . Alcohol Use: 0.0 oz/week    0 Not specified per week     Comment: 6 pack of beer / week    Family History  Problem Relation Age of Onset  . Diabetes Mother   . Hyperlipidemia Mother   . Hypertension Mother   . Heart disease Mother   . Cancer Father     lymphoma  . Anesthesia problems Neg Hx     Allergies  Allergen Reactions  . Septra [Bactrim] Shortness Of Breath and Itching    Current outpatient prescriptions: amLODipine (NORVASC) 10 MG tablet, Take 10  mg by mouth daily after lunch daily after lunch. , Disp: , Rfl: ;  aspirin 81 MG tablet, Take 81 mg by mouth daily.  , Disp: , Rfl: ;  buPROPion (WELLBUTRIN SR) 150 MG 12 hr tablet, Take 150 mg by mouth 2 (two) times daily. , Disp: , Rfl: ;  carvedilol (COREG) 6.25 MG tablet, Take 6.25 mg by mouth 2 (two) times daily with a meal. , Disp: , Rfl:  chlorthalidone (HYGROTON) 50 MG tablet, Take 50 mg by mouth daily. , Disp: , Rfl: ;  clonazePAM (KLONOPIN) 1 MG tablet, Take 1 mg by mouth 2 (two) times daily as needed. For anxiety, Disp: , Rfl: ;  cyclobenzaprine (FLEXERIL) 10 MG tablet, Take 10 mg by mouth 3 (three) times daily as needed. For muscle pain, Disp: , Rfl: ;  gabapentin (NEURONTIN) 300 MG capsule, Take 600 mg by mouth 4 (four) times daily. , Disp: , Rfl:  isosorbide mononitrate (IMDUR) 30 MG 24 hr tablet, Take 30 mg by mouth daily after lunch daily after lunch. , Disp: , Rfl: ;  lisinopril (PRINIVIL,ZESTRIL) 20 MG tablet, Take 20 mg by mouth daily after lunch daily after lunch. , Disp: , Rfl: ;  methadone (  DOLOPHINE) 10 MG tablet, Take 20 mg by mouth every 6 (six) hours. Three four times a day, Disp: , Rfl:  Multiple Vitamins-Minerals (MULTIVITAMIN WITH MINERALS) tablet, Take 1 tablet by mouth daily.  , Disp: , Rfl: ;  nitroGLYCERIN (NITROSTAT) 0.4 MG SL tablet, Place 0.4 mg under the tongue every 5 (five) minutes as needed. For chest pain, Disp: , Rfl: ;  omeprazole (PRILOSEC) 20 MG capsule, Take 20 mg by mouth at bedtime. , Disp: , Rfl: ;  pravastatin (PRAVACHOL) 40 MG tablet, Take 40 mg by mouth daily after lunch daily after lunch. , Disp: , Rfl:  traMADol (ULTRAM) 50 MG tablet, Take 50 mg by mouth every 6 (six) hours as needed. Maximum dose= 8 tablets per day. For pain, Disp: , Rfl:   BP 110/72 mmHg  Pulse 82  Resp 14  Ht 5\' 4"  (1.626 m)  Wt 165 lb (74.844 kg)  BMI 28.31 kg/m2  Body mass index is 28.31 kg/(m^2).           Review of Systems denies chest pain, dyspnea on exertion,  PND, orthopnea, hemoptysis.    Objective:   Physical Exam BP 110/72 mmHg  Pulse 82  Resp 14  Ht 5\' 4"  (1.626 m)  Wt 165 lb (74.844 kg)  BMI 28.31 kg/m2  Gen. well-developed well-nourished female no apparent stress alert and oriented 3 Lungs no rhonchi or wheezing Right leg with bulging varicosities and medial thigh and medial calf with 1-2+ edema below the knee. Mild hyperpigmentation lower third of leg with no active ulcer. 2+ to Salas pedis pulse palpable.  Left left leg with bulging varicosities medial thigh and calf to a lesser degree with 1+ edema no active ulcer.  Patient has gross reflux documented in both great saphenous veins the right worse than the left and larger in diameter than the left with some reflux in the common femoral vein. The great saphenous vein is supplying the bulging symptomatic varicosities     Assessment:     Gross reflux bilateral great saphenous veins with painful varicosities which are resistant to conservative measures including long-leg elastic compression stockings, elevation, and ibuprofen. Symptoms are affecting patient's daily living and edema is progressing.    Plan:     #1 patient needs laser ablation right great saphenous vein and then return in 3 months to see if stab phlebectomy would be indicated #2 will not proceed with laser ablation of left great saphenous vein since patient has coronary artery disease and has had previous stents and may require coronary artery bypass grafting in the future. The size of the vein could still be adequate to use as a conduit for coronary artery bypass grafting so we'll hold off on the left side at this time. Will proceed with precertification to perform the right leg in the near future

## 2014-05-10 ENCOUNTER — Other Ambulatory Visit: Payer: Self-pay | Admitting: *Deleted

## 2014-05-10 DIAGNOSIS — I83891 Varicose veins of right lower extremities with other complications: Secondary | ICD-10-CM

## 2014-06-17 ENCOUNTER — Encounter: Payer: Self-pay | Admitting: Vascular Surgery

## 2014-06-20 ENCOUNTER — Ambulatory Visit (INDEPENDENT_AMBULATORY_CARE_PROVIDER_SITE_OTHER): Payer: Medicare HMO | Admitting: Vascular Surgery

## 2014-06-20 ENCOUNTER — Encounter: Payer: Self-pay | Admitting: Vascular Surgery

## 2014-06-20 VITALS — BP 118/75 | HR 77 | Resp 16 | Ht 64.0 in | Wt 167.0 lb

## 2014-06-20 DIAGNOSIS — I83899 Varicose veins of unspecified lower extremities with other complications: Secondary | ICD-10-CM | POA: Insufficient documentation

## 2014-06-20 DIAGNOSIS — I83891 Varicose veins of right lower extremities with other complications: Secondary | ICD-10-CM | POA: Diagnosis not present

## 2014-06-20 HISTORY — DX: Varicose veins of unspecified lower extremity with other complications: I83.899

## 2014-06-20 NOTE — Progress Notes (Signed)
Laser Ablation Procedure    Date: 06/20/2014   Makayla Fischer DOB:1960/04/26  Consent signed: Yes    Surgeon:  Dr. Nelda Severe. Kellie Simmering  Procedure: Laser Ablation: right Greater Saphenous Vein  Dr. Kellie Simmering  BP 118/75 mmHg  Pulse 77  Resp 16  Ht 5\' 4"  (1.626 m)  Wt 167 lb (75.751 kg)  BMI 28.65 kg/m2  Tumescent Anesthesia: 300 cc 0.9% NaCl with 50 cc Lidocaine HCL with 1% Epi and 15 cc 8.4% NaHCO3  Local Anesthesia: 3 cc Lidocaine HCL and NaHCO3 (ratio 2:1)  Pulsed Mode: 15 watts, 580ms delay, 1.0 duration  Total Energy:   2281           Total Pulses: 153               Total Time: 2:32    Patient tolerated procedure well  Notes:   Description of Procedure:  After marking the course of the secondary varicosities, the patient was placed on the operating table in the supine position, and the right leg was prepped and draped in sterile fashion.   Local anesthetic was administered and under ultrasound guidance the saphenous vein was accessed with a micro needle and guide wire; then the mirco puncture sheath was placed.  A guide wire was inserted saphenofemoral junction , followed by a 5 french sheath.  The position of the sheath and then the laser fiber below the junction was confirmed using the ultrasound.  Tumescent anesthesia was administered along the course of the saphenous vein using ultrasound guidance. The patient was placed in Trendelenburg position and protective laser glasses were placed on patient and staff, and the laser was fired at 15 watts continuous mode advancing 1-60mm/second for a total of 2281 joules.      Steri strips were applied to the stab wounds and ABD pads and thigh high compression stockings were applied.  Ace wrap bandages were applied over the phlebectomy sites and at the top of the saphenofemoral junction. Blood loss was less than 15 cc.  The patient ambulated out of the operating room having tolerated the procedure well.

## 2014-06-20 NOTE — Progress Notes (Signed)
Subjective:     Patient ID: ALEKSIS JIGGETTS, female   DOB: 1960/04/03, 54 y.o.   MRN: [id number]  HPI this 54 year old female had laser ablation of the right great saphenous vein from the distal thigh to near the saphenofemoral junction performed under local tumescent anesthesia for gross reflux with pain and swelling right leg. A total of 2380 J of energy was utilized. She tolerated the procedure well. Review of Systems     Objective:   Physical Exam BP 118/75 mmHg  Pulse 77  Resp 16  Ht 5\' 4"  (1.626 m)  Wt 167 lb (75.751 kg)  BMI 28.65 kg/m2       Assessment:     Well-tolerated laser ablation right great saphenous vein performed under local tumescent anesthesia    Plan:     Return in 1 week for venous duplex exam right leg confirm closure

## 2014-06-21 ENCOUNTER — Telehealth: Payer: Self-pay | Admitting: *Deleted

## 2014-06-21 NOTE — Telephone Encounter (Signed)
Pt states she is doing well, slept well and not having any pain. Following all instructions.

## 2014-06-22 ENCOUNTER — Encounter: Payer: Self-pay | Admitting: Vascular Surgery

## 2014-06-24 ENCOUNTER — Encounter: Payer: Self-pay | Admitting: Vascular Surgery

## 2014-06-28 ENCOUNTER — Ambulatory Visit (HOSPITAL_COMMUNITY)
Admission: RE | Admit: 2014-06-28 | Discharge: 2014-06-28 | Disposition: A | Discharge status: Home / Self Care | Payer: Medicare HMO | Source: Ambulatory Visit | Attending: Vascular Surgery | Admitting: Vascular Surgery

## 2014-06-28 ENCOUNTER — Encounter: Payer: Self-pay | Admitting: Vascular Surgery

## 2014-06-28 ENCOUNTER — Ambulatory Visit (INDEPENDENT_AMBULATORY_CARE_PROVIDER_SITE_OTHER): Payer: Medicare HMO | Admitting: Vascular Surgery

## 2014-06-28 VITALS — BP 112/73 | HR 80 | Resp 16 | Ht 64.0 in | Wt 167.0 lb

## 2014-06-28 DIAGNOSIS — I83891 Varicose veins of right lower extremities with other complications: Secondary | ICD-10-CM

## 2014-06-28 DIAGNOSIS — I8391 Asymptomatic varicose veins of right lower extremity: Secondary | ICD-10-CM | POA: Insufficient documentation

## 2014-06-28 NOTE — Progress Notes (Signed)
Subjective:     Patient ID: Makayla Fischer, female   DOB: July 07, 1960, 54 y.o.   MRN: [id number]  HPI this 54 year old female returns 1 week post-laser ablation right great saphenous vein for painful varicosities with gross reflux in the right great saphenous system. She has had mild-to-moderate discomfort along the course of the saphenous vein with the ablation was performed. She states this is improving. She did wear the long leg elastic compression stockings and take ibuprofen as instructed. She also has a history of some painful varicosities in the left leg with gross reflux in the great saphenous vein. Because of her history of coronary artery disease and the fact that the left great saphenous vein is an adequate size for a conduit--- we will not perform laser ablation on the left leg.  Past Medical History  Diagnosis Date  . Chronic pain   . Hyperlipidemia   . GERD (gastroesophageal reflux disease)   . Depression   . CAD (coronary artery disease)   . Myocardial infarction 02/2008  . Osteoporosis   . Substance abuse     drugs and alcohol  . Incisional hernia     with obstruction  . Shortness of breath   . Arthritis     all over, back, knees   . Anxiety     h/o panic attacks   . Neuromuscular disorder     neuropathy  . Fibromyalgia   . Fatty liver     by 01/15/11 CT scan  . Anginal pain   . Hypertension     sees Dr. Posey Rea, stress test in 01/2011    History  Substance Use Topics  . Smoking status: Current Every Day Smoker -- 0.50 packs/day for 28 years    Types: Cigarettes  . Smokeless tobacco: Never Used  . Alcohol Use: 0.0 oz/week    0 Standard drinks or equivalent per week     Comment: 6 pack of beer / week    Family History  Problem Relation Age of Onset  . Diabetes Mother   . Hyperlipidemia Mother   . Hypertension Mother   . Heart disease Mother   . Cancer Father     lymphoma  . Anesthesia problems Neg Hx     Allergies  Allergen Reactions  . Septra  [Bactrim] Shortness Of Breath and Itching     Current outpatient prescriptions:  .  amLODipine (NORVASC) 10 MG tablet, Take 10 mg by mouth daily after lunch daily after lunch. , Disp: , Rfl:  .  aspirin 81 MG tablet, Take 81 mg by mouth daily.  , Disp: , Rfl:  .  buPROPion (WELLBUTRIN SR) 150 MG 12 hr tablet, Take 150 mg by mouth 2 (two) times daily. , Disp: , Rfl:  .  carvedilol (COREG) 6.25 MG tablet, Take 6.25 mg by mouth 2 (two) times daily with a meal. , Disp: , Rfl:  .  chlorthalidone (HYGROTON) 50 MG tablet, Take 50 mg by mouth daily. , Disp: , Rfl:  .  clonazePAM (KLONOPIN) 1 MG tablet, Take 1 mg by mouth 2 (two) times daily as needed. For anxiety, Disp: , Rfl:  .  cyclobenzaprine (FLEXERIL) 10 MG tablet, Take 10 mg by mouth 3 (three) times daily as needed. For muscle pain, Disp: , Rfl:  .  gabapentin (NEURONTIN) 300 MG capsule, Take 600 mg by mouth 4 (four) times daily. , Disp: , Rfl:  .  isosorbide mononitrate (IMDUR) 30 MG 24 hr tablet, Take 30 mg by mouth  daily after lunch daily after lunch. , Disp: , Rfl:  .  lisinopril (PRINIVIL,ZESTRIL) 20 MG tablet, Take 20 mg by mouth daily after lunch daily after lunch. , Disp: , Rfl:  .  methadone (DOLOPHINE) 10 MG tablet, Take 20 mg by mouth every 6 (six) hours. Three four times a day, Disp: , Rfl:  .  Multiple Vitamins-Minerals (MULTIVITAMIN WITH MINERALS) tablet, Take 1 tablet by mouth daily.  , Disp: , Rfl:  .  nitroGLYCERIN (NITROSTAT) 0.4 MG SL tablet, Place 0.4 mg under the tongue every 5 (five) minutes as needed. For chest pain, Disp: , Rfl:  .  omeprazole (PRILOSEC) 20 MG capsule, Take 20 mg by mouth at bedtime. , Disp: , Rfl:  .  pravastatin (PRAVACHOL) 40 MG tablet, Take 40 mg by mouth daily after lunch daily after lunch. , Disp: , Rfl:  .  traMADol (ULTRAM) 50 MG tablet, Take 50 mg by mouth every 6 (six) hours as needed. Maximum dose= 8 tablets per day. For pain, Disp: , Rfl:   Filed Vitals:   06/28/14 1327  BP: 112/73  Pulse:  80  Resp: 16  Height: 5\' 4"  (1.626 m)  Weight: 167 lb (75.751 kg)    Body mass index is 28.65 kg/(m^2).          Review of Systems has occasional chest discomfort which is chronic. Denies dyspnea on exertion, orthopnea, hemoptysis   claudication. Objective:   Physical Exam BP 112/73 mmHg  Pulse 80  Resp 16  Ht 5\' 4"  (1.626 m)  Wt 167 lb (75.751 kg)  BMI 28.65 kg/m2  Gen. well-developed well-nourished female in no apparent distress alert and oriented 3 Lungs no rhonchi or wheezing Cardiovascular regular rhythm no murmurs Right leg with mild to moderate ecchymosis from knee to near the saphenofemoral junction. No hematoma noted. 2+ dorsalis pedis pulses palpable. Today I ordered a venous duplex exam of the right leg which I reviewed and interpreted. The right great saphenous vein is totally closed up to near the saphenofemoral junction with no DVT     Assessment:     Successful laser ablation right great saphenous vein for painful varicosities due to gross reflux right great saphenous system    Plan:     Return in 3 months to see if stab phlebectomy and/or sclerotherapy will be indicated for residual painful varicosities

## 2014-10-18 ENCOUNTER — Ambulatory Visit: Payer: Medicare HMO | Admitting: Vascular Surgery

## 2016-09-13 DIAGNOSIS — I10 Essential (primary) hypertension: Secondary | ICD-10-CM

## 2016-09-13 DIAGNOSIS — Z720 Tobacco use: Secondary | ICD-10-CM

## 2016-09-13 DIAGNOSIS — E876 Hypokalemia: Secondary | ICD-10-CM | POA: Diagnosis not present

## 2016-09-13 DIAGNOSIS — R079 Chest pain, unspecified: Secondary | ICD-10-CM

## 2016-09-14 DIAGNOSIS — E876 Hypokalemia: Secondary | ICD-10-CM | POA: Diagnosis not present

## 2016-09-14 DIAGNOSIS — I10 Essential (primary) hypertension: Secondary | ICD-10-CM | POA: Diagnosis not present

## 2016-09-14 DIAGNOSIS — Z720 Tobacco use: Secondary | ICD-10-CM | POA: Diagnosis not present

## 2016-09-14 DIAGNOSIS — R079 Chest pain, unspecified: Secondary | ICD-10-CM

## 2017-01-15 DIAGNOSIS — E876 Hypokalemia: Secondary | ICD-10-CM | POA: Diagnosis not present

## 2017-01-15 DIAGNOSIS — R079 Chest pain, unspecified: Secondary | ICD-10-CM | POA: Diagnosis not present

## 2017-01-15 DIAGNOSIS — F1010 Alcohol abuse, uncomplicated: Secondary | ICD-10-CM | POA: Diagnosis not present

## 2017-01-15 DIAGNOSIS — E871 Hypo-osmolality and hyponatremia: Secondary | ICD-10-CM | POA: Diagnosis not present

## 2017-01-15 DIAGNOSIS — R0689 Other abnormalities of breathing: Secondary | ICD-10-CM | POA: Diagnosis not present

## 2017-01-16 DIAGNOSIS — F1010 Alcohol abuse, uncomplicated: Secondary | ICD-10-CM | POA: Diagnosis not present

## 2017-01-16 DIAGNOSIS — E876 Hypokalemia: Secondary | ICD-10-CM | POA: Diagnosis not present

## 2017-01-16 DIAGNOSIS — R079 Chest pain, unspecified: Secondary | ICD-10-CM | POA: Diagnosis not present

## 2017-08-10 DIAGNOSIS — Z789 Other specified health status: Secondary | ICD-10-CM

## 2017-08-10 DIAGNOSIS — E871 Hypo-osmolality and hyponatremia: Secondary | ICD-10-CM

## 2017-08-10 DIAGNOSIS — E119 Type 2 diabetes mellitus without complications: Secondary | ICD-10-CM

## 2017-08-10 DIAGNOSIS — I10 Essential (primary) hypertension: Secondary | ICD-10-CM

## 2017-08-10 DIAGNOSIS — F1120 Opioid dependence, uncomplicated: Secondary | ICD-10-CM

## 2017-08-10 DIAGNOSIS — F17200 Nicotine dependence, unspecified, uncomplicated: Secondary | ICD-10-CM | POA: Insufficient documentation

## 2017-08-10 DIAGNOSIS — Z720 Tobacco use: Secondary | ICD-10-CM

## 2017-08-10 DIAGNOSIS — I2510 Atherosclerotic heart disease of native coronary artery without angina pectoris: Secondary | ICD-10-CM | POA: Insufficient documentation

## 2017-08-10 HISTORY — DX: Essential (primary) hypertension: I10

## 2017-08-10 HISTORY — DX: Other specified health status: Z78.9

## 2017-08-10 HISTORY — DX: Hypomagnesemia: E83.42

## 2017-08-10 HISTORY — DX: Hypo-osmolality and hyponatremia: E87.1

## 2017-08-10 HISTORY — DX: Tobacco use: Z72.0

## 2017-08-10 HISTORY — DX: Atherosclerotic heart disease of native coronary artery without angina pectoris: I25.10

## 2017-08-10 HISTORY — DX: Opioid dependence, uncomplicated: F11.20

## 2017-08-10 HISTORY — DX: Type 2 diabetes mellitus without complications: E11.9

## 2018-03-31 DIAGNOSIS — K8000 Calculus of gallbladder with acute cholecystitis without obstruction: Secondary | ICD-10-CM | POA: Diagnosis not present

## 2018-03-31 DIAGNOSIS — Z6827 Body mass index (BMI) 27.0-27.9, adult: Secondary | ICD-10-CM | POA: Diagnosis not present

## 2018-04-07 DIAGNOSIS — F1123 Opioid dependence with withdrawal: Secondary | ICD-10-CM | POA: Diagnosis not present

## 2018-04-07 DIAGNOSIS — Z79891 Long term (current) use of opiate analgesic: Secondary | ICD-10-CM | POA: Diagnosis not present

## 2018-04-07 DIAGNOSIS — F1994 Other psychoactive substance use, unspecified with psychoactive substance-induced mood disorder: Secondary | ICD-10-CM | POA: Diagnosis not present

## 2018-04-07 DIAGNOSIS — Z6827 Body mass index (BMI) 27.0-27.9, adult: Secondary | ICD-10-CM | POA: Diagnosis not present

## 2018-04-07 DIAGNOSIS — F1120 Opioid dependence, uncomplicated: Secondary | ICD-10-CM | POA: Diagnosis not present

## 2018-04-07 DIAGNOSIS — F1910 Other psychoactive substance abuse, uncomplicated: Secondary | ICD-10-CM | POA: Diagnosis not present

## 2018-04-21 DIAGNOSIS — F1120 Opioid dependence, uncomplicated: Secondary | ICD-10-CM | POA: Diagnosis not present

## 2018-04-21 DIAGNOSIS — F1994 Other psychoactive substance use, unspecified with psychoactive substance-induced mood disorder: Secondary | ICD-10-CM | POA: Diagnosis not present

## 2018-04-21 DIAGNOSIS — Z6827 Body mass index (BMI) 27.0-27.9, adult: Secondary | ICD-10-CM | POA: Diagnosis not present

## 2018-04-21 DIAGNOSIS — F1123 Opioid dependence with withdrawal: Secondary | ICD-10-CM | POA: Diagnosis not present

## 2018-04-21 DIAGNOSIS — Z79891 Long term (current) use of opiate analgesic: Secondary | ICD-10-CM | POA: Diagnosis not present

## 2018-04-21 DIAGNOSIS — F1910 Other psychoactive substance abuse, uncomplicated: Secondary | ICD-10-CM | POA: Diagnosis not present

## 2018-05-05 DIAGNOSIS — Z79891 Long term (current) use of opiate analgesic: Secondary | ICD-10-CM | POA: Diagnosis not present

## 2018-05-05 DIAGNOSIS — F1120 Opioid dependence, uncomplicated: Secondary | ICD-10-CM | POA: Diagnosis not present

## 2018-05-05 DIAGNOSIS — F1910 Other psychoactive substance abuse, uncomplicated: Secondary | ICD-10-CM | POA: Diagnosis not present

## 2018-05-05 DIAGNOSIS — Z6827 Body mass index (BMI) 27.0-27.9, adult: Secondary | ICD-10-CM | POA: Diagnosis not present

## 2018-05-05 DIAGNOSIS — F1123 Opioid dependence with withdrawal: Secondary | ICD-10-CM | POA: Diagnosis not present

## 2018-05-05 DIAGNOSIS — F1994 Other psychoactive substance use, unspecified with psychoactive substance-induced mood disorder: Secondary | ICD-10-CM | POA: Diagnosis not present

## 2018-05-25 DIAGNOSIS — F17210 Nicotine dependence, cigarettes, uncomplicated: Secondary | ICD-10-CM | POA: Diagnosis not present

## 2018-05-25 DIAGNOSIS — I10 Essential (primary) hypertension: Secondary | ICD-10-CM | POA: Diagnosis not present

## 2018-05-25 DIAGNOSIS — J449 Chronic obstructive pulmonary disease, unspecified: Secondary | ICD-10-CM | POA: Diagnosis not present

## 2018-05-25 DIAGNOSIS — Z23 Encounter for immunization: Secondary | ICD-10-CM | POA: Diagnosis not present

## 2018-05-25 DIAGNOSIS — R05 Cough: Secondary | ICD-10-CM | POA: Diagnosis not present

## 2018-05-25 DIAGNOSIS — H5500 Unspecified nystagmus: Secondary | ICD-10-CM | POA: Diagnosis not present

## 2018-05-25 DIAGNOSIS — S21311A Laceration without foreign body of right front wall of thorax with penetration into thoracic cavity, initial encounter: Secondary | ICD-10-CM | POA: Diagnosis not present

## 2018-05-25 DIAGNOSIS — R51 Headache: Secondary | ICD-10-CM | POA: Diagnosis not present

## 2018-05-26 DIAGNOSIS — F1994 Other psychoactive substance use, unspecified with psychoactive substance-induced mood disorder: Secondary | ICD-10-CM | POA: Diagnosis not present

## 2018-05-26 DIAGNOSIS — Z79891 Long term (current) use of opiate analgesic: Secondary | ICD-10-CM | POA: Diagnosis not present

## 2018-05-26 DIAGNOSIS — F1120 Opioid dependence, uncomplicated: Secondary | ICD-10-CM | POA: Diagnosis not present

## 2018-05-26 DIAGNOSIS — F1123 Opioid dependence with withdrawal: Secondary | ICD-10-CM | POA: Diagnosis not present

## 2018-05-26 DIAGNOSIS — Z6827 Body mass index (BMI) 27.0-27.9, adult: Secondary | ICD-10-CM | POA: Diagnosis not present

## 2018-05-26 DIAGNOSIS — F1910 Other psychoactive substance abuse, uncomplicated: Secondary | ICD-10-CM | POA: Diagnosis not present

## 2018-05-29 DIAGNOSIS — S21119A Laceration without foreign body of unspecified front wall of thorax without penetration into thoracic cavity, initial encounter: Secondary | ICD-10-CM | POA: Diagnosis not present

## 2018-06-07 DIAGNOSIS — Z4802 Encounter for removal of sutures: Secondary | ICD-10-CM | POA: Diagnosis not present

## 2018-06-07 DIAGNOSIS — S50861A Insect bite (nonvenomous) of right forearm, initial encounter: Secondary | ICD-10-CM | POA: Diagnosis not present

## 2018-06-07 DIAGNOSIS — S50851A Superficial foreign body of right forearm, initial encounter: Secondary | ICD-10-CM | POA: Diagnosis not present

## 2018-06-07 DIAGNOSIS — E860 Dehydration: Secondary | ICD-10-CM | POA: Diagnosis not present

## 2018-06-07 DIAGNOSIS — Z4801 Encounter for change or removal of surgical wound dressing: Secondary | ICD-10-CM | POA: Diagnosis not present

## 2018-06-07 DIAGNOSIS — F17210 Nicotine dependence, cigarettes, uncomplicated: Secondary | ICD-10-CM | POA: Diagnosis not present

## 2018-06-07 DIAGNOSIS — I10 Essential (primary) hypertension: Secondary | ICD-10-CM | POA: Diagnosis not present

## 2018-06-09 DIAGNOSIS — F1910 Other psychoactive substance abuse, uncomplicated: Secondary | ICD-10-CM | POA: Diagnosis not present

## 2018-06-09 DIAGNOSIS — F1120 Opioid dependence, uncomplicated: Secondary | ICD-10-CM | POA: Diagnosis not present

## 2018-06-09 DIAGNOSIS — Z79891 Long term (current) use of opiate analgesic: Secondary | ICD-10-CM | POA: Diagnosis not present

## 2018-06-09 DIAGNOSIS — Z6827 Body mass index (BMI) 27.0-27.9, adult: Secondary | ICD-10-CM | POA: Diagnosis not present

## 2018-06-09 DIAGNOSIS — F1123 Opioid dependence with withdrawal: Secondary | ICD-10-CM | POA: Diagnosis not present

## 2018-06-09 DIAGNOSIS — F1994 Other psychoactive substance use, unspecified with psychoactive substance-induced mood disorder: Secondary | ICD-10-CM | POA: Diagnosis not present

## 2018-06-23 DIAGNOSIS — F1120 Opioid dependence, uncomplicated: Secondary | ICD-10-CM | POA: Diagnosis not present

## 2018-06-23 DIAGNOSIS — F1994 Other psychoactive substance use, unspecified with psychoactive substance-induced mood disorder: Secondary | ICD-10-CM | POA: Diagnosis not present

## 2018-06-23 DIAGNOSIS — F1910 Other psychoactive substance abuse, uncomplicated: Secondary | ICD-10-CM | POA: Diagnosis not present

## 2018-06-23 DIAGNOSIS — F1123 Opioid dependence with withdrawal: Secondary | ICD-10-CM | POA: Diagnosis not present

## 2018-06-23 DIAGNOSIS — Z6826 Body mass index (BMI) 26.0-26.9, adult: Secondary | ICD-10-CM | POA: Diagnosis not present

## 2018-06-23 DIAGNOSIS — Z79891 Long term (current) use of opiate analgesic: Secondary | ICD-10-CM | POA: Diagnosis not present

## 2018-06-26 DIAGNOSIS — Z9181 History of falling: Secondary | ICD-10-CM | POA: Diagnosis not present

## 2018-06-26 DIAGNOSIS — Z1331 Encounter for screening for depression: Secondary | ICD-10-CM | POA: Diagnosis not present

## 2018-06-26 DIAGNOSIS — E785 Hyperlipidemia, unspecified: Secondary | ICD-10-CM | POA: Diagnosis not present

## 2018-06-26 DIAGNOSIS — Z0000 Encounter for general adult medical examination without abnormal findings: Secondary | ICD-10-CM | POA: Diagnosis not present

## 2018-07-10 DIAGNOSIS — Z6826 Body mass index (BMI) 26.0-26.9, adult: Secondary | ICD-10-CM | POA: Diagnosis not present

## 2018-07-10 DIAGNOSIS — M5416 Radiculopathy, lumbar region: Secondary | ICD-10-CM | POA: Diagnosis not present

## 2018-07-14 DIAGNOSIS — F1994 Other psychoactive substance use, unspecified with psychoactive substance-induced mood disorder: Secondary | ICD-10-CM | POA: Diagnosis not present

## 2018-07-14 DIAGNOSIS — F11988 Opioid use, unspecified with other opioid-induced disorder: Secondary | ICD-10-CM | POA: Diagnosis not present

## 2018-07-14 DIAGNOSIS — F1120 Opioid dependence, uncomplicated: Secondary | ICD-10-CM | POA: Diagnosis not present

## 2018-07-14 DIAGNOSIS — Z6826 Body mass index (BMI) 26.0-26.9, adult: Secondary | ICD-10-CM | POA: Diagnosis not present

## 2018-07-14 DIAGNOSIS — Z79891 Long term (current) use of opiate analgesic: Secondary | ICD-10-CM | POA: Diagnosis not present

## 2018-07-14 DIAGNOSIS — F1123 Opioid dependence with withdrawal: Secondary | ICD-10-CM | POA: Diagnosis not present

## 2018-07-14 DIAGNOSIS — F1910 Other psychoactive substance abuse, uncomplicated: Secondary | ICD-10-CM | POA: Diagnosis not present

## 2018-07-18 DIAGNOSIS — F17210 Nicotine dependence, cigarettes, uncomplicated: Secondary | ICD-10-CM | POA: Diagnosis not present

## 2018-07-18 DIAGNOSIS — Z79899 Other long term (current) drug therapy: Secondary | ICD-10-CM | POA: Diagnosis not present

## 2018-07-18 DIAGNOSIS — I10 Essential (primary) hypertension: Secondary | ICD-10-CM | POA: Diagnosis not present

## 2018-07-18 DIAGNOSIS — S01512A Laceration without foreign body of oral cavity, initial encounter: Secondary | ICD-10-CM | POA: Diagnosis not present

## 2018-07-22 DIAGNOSIS — Z6826 Body mass index (BMI) 26.0-26.9, adult: Secondary | ICD-10-CM | POA: Diagnosis not present

## 2018-07-22 DIAGNOSIS — I8393 Asymptomatic varicose veins of bilateral lower extremities: Secondary | ICD-10-CM | POA: Diagnosis not present

## 2018-07-22 DIAGNOSIS — E782 Mixed hyperlipidemia: Secondary | ICD-10-CM | POA: Diagnosis not present

## 2018-07-28 DIAGNOSIS — F1120 Opioid dependence, uncomplicated: Secondary | ICD-10-CM | POA: Diagnosis not present

## 2018-07-28 DIAGNOSIS — E782 Mixed hyperlipidemia: Secondary | ICD-10-CM | POA: Diagnosis not present

## 2018-07-28 DIAGNOSIS — F1123 Opioid dependence with withdrawal: Secondary | ICD-10-CM | POA: Diagnosis not present

## 2018-07-28 DIAGNOSIS — F1994 Other psychoactive substance use, unspecified with psychoactive substance-induced mood disorder: Secondary | ICD-10-CM | POA: Diagnosis not present

## 2018-07-28 DIAGNOSIS — E11610 Type 2 diabetes mellitus with diabetic neuropathic arthropathy: Secondary | ICD-10-CM | POA: Diagnosis not present

## 2018-07-28 DIAGNOSIS — Z6827 Body mass index (BMI) 27.0-27.9, adult: Secondary | ICD-10-CM | POA: Diagnosis not present

## 2018-07-28 DIAGNOSIS — Z79891 Long term (current) use of opiate analgesic: Secondary | ICD-10-CM | POA: Diagnosis not present

## 2018-07-28 DIAGNOSIS — I10 Essential (primary) hypertension: Secondary | ICD-10-CM | POA: Diagnosis not present

## 2018-07-28 DIAGNOSIS — F1910 Other psychoactive substance abuse, uncomplicated: Secondary | ICD-10-CM | POA: Diagnosis not present

## 2018-08-12 DIAGNOSIS — E871 Hypo-osmolality and hyponatremia: Secondary | ICD-10-CM | POA: Diagnosis not present

## 2018-08-18 DIAGNOSIS — F1910 Other psychoactive substance abuse, uncomplicated: Secondary | ICD-10-CM | POA: Diagnosis not present

## 2018-08-18 DIAGNOSIS — F1120 Opioid dependence, uncomplicated: Secondary | ICD-10-CM | POA: Diagnosis not present

## 2018-08-18 DIAGNOSIS — Z79891 Long term (current) use of opiate analgesic: Secondary | ICD-10-CM | POA: Diagnosis not present

## 2018-08-18 DIAGNOSIS — F1123 Opioid dependence with withdrawal: Secondary | ICD-10-CM | POA: Diagnosis not present

## 2018-08-18 DIAGNOSIS — F1994 Other psychoactive substance use, unspecified with psychoactive substance-induced mood disorder: Secondary | ICD-10-CM | POA: Diagnosis not present

## 2018-08-18 DIAGNOSIS — Z6827 Body mass index (BMI) 27.0-27.9, adult: Secondary | ICD-10-CM | POA: Diagnosis not present

## 2018-08-24 DIAGNOSIS — K8020 Calculus of gallbladder without cholecystitis without obstruction: Secondary | ICD-10-CM | POA: Diagnosis not present

## 2018-08-24 DIAGNOSIS — J449 Chronic obstructive pulmonary disease, unspecified: Secondary | ICD-10-CM | POA: Diagnosis not present

## 2018-08-24 DIAGNOSIS — Z6827 Body mass index (BMI) 27.0-27.9, adult: Secondary | ICD-10-CM | POA: Diagnosis not present

## 2018-08-26 DIAGNOSIS — K8010 Calculus of gallbladder with chronic cholecystitis without obstruction: Secondary | ICD-10-CM | POA: Diagnosis not present

## 2018-08-31 DIAGNOSIS — R0602 Shortness of breath: Secondary | ICD-10-CM | POA: Diagnosis not present

## 2018-09-01 DIAGNOSIS — R0602 Shortness of breath: Secondary | ICD-10-CM | POA: Diagnosis not present

## 2018-09-01 DIAGNOSIS — F1120 Opioid dependence, uncomplicated: Secondary | ICD-10-CM | POA: Diagnosis not present

## 2018-09-01 DIAGNOSIS — F1123 Opioid dependence with withdrawal: Secondary | ICD-10-CM | POA: Diagnosis not present

## 2018-09-01 DIAGNOSIS — Z6826 Body mass index (BMI) 26.0-26.9, adult: Secondary | ICD-10-CM | POA: Diagnosis not present

## 2018-09-01 DIAGNOSIS — Z79891 Long term (current) use of opiate analgesic: Secondary | ICD-10-CM | POA: Diagnosis not present

## 2018-09-01 DIAGNOSIS — E878 Other disorders of electrolyte and fluid balance, not elsewhere classified: Secondary | ICD-10-CM | POA: Diagnosis not present

## 2018-09-01 DIAGNOSIS — F1910 Other psychoactive substance abuse, uncomplicated: Secondary | ICD-10-CM | POA: Diagnosis not present

## 2018-09-01 DIAGNOSIS — F1994 Other psychoactive substance use, unspecified with psychoactive substance-induced mood disorder: Secondary | ICD-10-CM | POA: Diagnosis not present

## 2018-09-02 ENCOUNTER — Encounter (HOSPITAL_COMMUNITY): Payer: Medicare HMO

## 2018-09-02 ENCOUNTER — Encounter: Payer: Medicare HMO | Admitting: Vascular Surgery

## 2018-09-02 ENCOUNTER — Encounter: Payer: Self-pay | Admitting: Cardiology

## 2018-09-02 ENCOUNTER — Encounter: Payer: Self-pay | Admitting: *Deleted

## 2018-09-03 ENCOUNTER — Telehealth: Payer: Self-pay

## 2018-09-03 ENCOUNTER — Ambulatory Visit: Payer: Medicare HMO | Admitting: Cardiology

## 2018-09-03 NOTE — Telephone Encounter (Signed)
Patient scheduled for gallbladder removal on 09/14/18, surgeon called wanting cardiac clearance. Informed that patient is new to this office and scheduled for office visit on 09/04/18 with Dr. Docia Furl no paperwork available.

## 2018-09-04 ENCOUNTER — Ambulatory Visit: Payer: Medicare HMO | Admitting: Cardiology

## 2018-09-10 DIAGNOSIS — F1123 Opioid dependence with withdrawal: Secondary | ICD-10-CM | POA: Diagnosis not present

## 2018-09-10 DIAGNOSIS — F1120 Opioid dependence, uncomplicated: Secondary | ICD-10-CM | POA: Diagnosis not present

## 2018-09-10 DIAGNOSIS — E878 Other disorders of electrolyte and fluid balance, not elsewhere classified: Secondary | ICD-10-CM | POA: Diagnosis not present

## 2018-09-10 DIAGNOSIS — Z79891 Long term (current) use of opiate analgesic: Secondary | ICD-10-CM | POA: Diagnosis not present

## 2018-09-10 DIAGNOSIS — F1910 Other psychoactive substance abuse, uncomplicated: Secondary | ICD-10-CM | POA: Diagnosis not present

## 2018-09-10 DIAGNOSIS — Z6826 Body mass index (BMI) 26.0-26.9, adult: Secondary | ICD-10-CM | POA: Diagnosis not present

## 2018-09-10 DIAGNOSIS — F1994 Other psychoactive substance use, unspecified with psychoactive substance-induced mood disorder: Secondary | ICD-10-CM | POA: Diagnosis not present

## 2018-09-13 DIAGNOSIS — R0789 Other chest pain: Secondary | ICD-10-CM | POA: Diagnosis not present

## 2018-09-13 DIAGNOSIS — I2510 Atherosclerotic heart disease of native coronary artery without angina pectoris: Secondary | ICD-10-CM | POA: Diagnosis not present

## 2018-09-13 DIAGNOSIS — R202 Paresthesia of skin: Secondary | ICD-10-CM | POA: Diagnosis not present

## 2018-09-13 DIAGNOSIS — Z1159 Encounter for screening for other viral diseases: Secondary | ICD-10-CM | POA: Diagnosis not present

## 2018-09-13 DIAGNOSIS — E860 Dehydration: Secondary | ICD-10-CM | POA: Diagnosis not present

## 2018-09-13 DIAGNOSIS — I10 Essential (primary) hypertension: Secondary | ICD-10-CM | POA: Diagnosis not present

## 2018-09-13 DIAGNOSIS — I252 Old myocardial infarction: Secondary | ICD-10-CM | POA: Diagnosis not present

## 2018-09-13 DIAGNOSIS — E119 Type 2 diabetes mellitus without complications: Secondary | ICD-10-CM | POA: Diagnosis not present

## 2018-09-13 DIAGNOSIS — J449 Chronic obstructive pulmonary disease, unspecified: Secondary | ICD-10-CM | POA: Diagnosis not present

## 2018-09-13 DIAGNOSIS — R000 Tachycardia, unspecified: Secondary | ICD-10-CM | POA: Diagnosis not present

## 2018-09-13 DIAGNOSIS — R42 Dizziness and giddiness: Secondary | ICD-10-CM | POA: Diagnosis not present

## 2018-09-13 DIAGNOSIS — R079 Chest pain, unspecified: Secondary | ICD-10-CM | POA: Diagnosis not present

## 2018-09-13 DIAGNOSIS — Z20828 Contact with and (suspected) exposure to other viral communicable diseases: Secondary | ICD-10-CM | POA: Diagnosis not present

## 2018-09-14 DIAGNOSIS — R079 Chest pain, unspecified: Secondary | ICD-10-CM | POA: Diagnosis not present

## 2018-09-14 DIAGNOSIS — E871 Hypo-osmolality and hyponatremia: Secondary | ICD-10-CM | POA: Diagnosis not present

## 2018-09-14 DIAGNOSIS — I10 Essential (primary) hypertension: Secondary | ICD-10-CM | POA: Diagnosis not present

## 2018-09-14 DIAGNOSIS — F17210 Nicotine dependence, cigarettes, uncomplicated: Secondary | ICD-10-CM | POA: Diagnosis not present

## 2018-09-14 DIAGNOSIS — I25110 Atherosclerotic heart disease of native coronary artery with unstable angina pectoris: Secondary | ICD-10-CM | POA: Diagnosis not present

## 2018-09-14 DIAGNOSIS — I2510 Atherosclerotic heart disease of native coronary artery without angina pectoris: Secondary | ICD-10-CM | POA: Diagnosis not present

## 2018-09-14 DIAGNOSIS — R001 Bradycardia, unspecified: Secondary | ICD-10-CM | POA: Diagnosis not present

## 2018-09-14 DIAGNOSIS — I252 Old myocardial infarction: Secondary | ICD-10-CM | POA: Diagnosis not present

## 2018-09-14 DIAGNOSIS — R0789 Other chest pain: Secondary | ICD-10-CM | POA: Diagnosis not present

## 2018-09-14 DIAGNOSIS — E785 Hyperlipidemia, unspecified: Secondary | ICD-10-CM | POA: Diagnosis not present

## 2018-09-14 DIAGNOSIS — J449 Chronic obstructive pulmonary disease, unspecified: Secondary | ICD-10-CM | POA: Diagnosis not present

## 2018-09-14 DIAGNOSIS — D509 Iron deficiency anemia, unspecified: Secondary | ICD-10-CM | POA: Diagnosis not present

## 2018-09-14 DIAGNOSIS — E119 Type 2 diabetes mellitus without complications: Secondary | ICD-10-CM | POA: Diagnosis not present

## 2018-09-15 DIAGNOSIS — I10 Essential (primary) hypertension: Secondary | ICD-10-CM | POA: Diagnosis not present

## 2018-09-15 DIAGNOSIS — J449 Chronic obstructive pulmonary disease, unspecified: Secondary | ICD-10-CM | POA: Diagnosis not present

## 2018-09-15 DIAGNOSIS — I252 Old myocardial infarction: Secondary | ICD-10-CM | POA: Diagnosis not present

## 2018-09-15 DIAGNOSIS — I058 Other rheumatic mitral valve diseases: Secondary | ICD-10-CM | POA: Diagnosis not present

## 2018-09-15 DIAGNOSIS — I25110 Atherosclerotic heart disease of native coronary artery with unstable angina pectoris: Secondary | ICD-10-CM | POA: Diagnosis not present

## 2018-09-15 DIAGNOSIS — D509 Iron deficiency anemia, unspecified: Secondary | ICD-10-CM | POA: Diagnosis not present

## 2018-09-15 DIAGNOSIS — F17210 Nicotine dependence, cigarettes, uncomplicated: Secondary | ICD-10-CM | POA: Diagnosis not present

## 2018-09-15 DIAGNOSIS — E119 Type 2 diabetes mellitus without complications: Secondary | ICD-10-CM | POA: Diagnosis not present

## 2018-09-15 DIAGNOSIS — I200 Unstable angina: Secondary | ICD-10-CM

## 2018-09-15 DIAGNOSIS — E871 Hypo-osmolality and hyponatremia: Secondary | ICD-10-CM | POA: Diagnosis not present

## 2018-09-15 DIAGNOSIS — Z955 Presence of coronary angioplasty implant and graft: Secondary | ICD-10-CM | POA: Insufficient documentation

## 2018-09-15 DIAGNOSIS — E785 Hyperlipidemia, unspecified: Secondary | ICD-10-CM | POA: Diagnosis not present

## 2018-09-15 HISTORY — DX: Presence of coronary angioplasty implant and graft: Z95.5

## 2018-09-15 HISTORY — DX: Unstable angina: I20.0

## 2018-09-17 ENCOUNTER — Other Ambulatory Visit: Payer: Self-pay

## 2018-09-17 DIAGNOSIS — I83899 Varicose veins of unspecified lower extremities with other complications: Secondary | ICD-10-CM

## 2018-09-23 ENCOUNTER — Ambulatory Visit: Payer: Medicare HMO | Admitting: Vascular Surgery

## 2018-09-23 ENCOUNTER — Encounter (HOSPITAL_COMMUNITY): Payer: Medicare HMO

## 2018-09-29 DIAGNOSIS — Z6826 Body mass index (BMI) 26.0-26.9, adult: Secondary | ICD-10-CM | POA: Diagnosis not present

## 2018-09-29 DIAGNOSIS — F1910 Other psychoactive substance abuse, uncomplicated: Secondary | ICD-10-CM | POA: Diagnosis not present

## 2018-09-29 DIAGNOSIS — F1123 Opioid dependence with withdrawal: Secondary | ICD-10-CM | POA: Diagnosis not present

## 2018-09-29 DIAGNOSIS — F1120 Opioid dependence, uncomplicated: Secondary | ICD-10-CM | POA: Diagnosis not present

## 2018-09-29 DIAGNOSIS — F1994 Other psychoactive substance use, unspecified with psychoactive substance-induced mood disorder: Secondary | ICD-10-CM | POA: Diagnosis not present

## 2018-09-29 DIAGNOSIS — Z79891 Long term (current) use of opiate analgesic: Secondary | ICD-10-CM | POA: Diagnosis not present

## 2018-10-08 ENCOUNTER — Ambulatory Visit: Payer: Medicare HMO | Admitting: Cardiology

## 2018-10-12 ENCOUNTER — Ambulatory Visit (INDEPENDENT_AMBULATORY_CARE_PROVIDER_SITE_OTHER): Payer: Medicare HMO | Admitting: Family

## 2018-10-12 ENCOUNTER — Encounter: Payer: Self-pay | Admitting: Family

## 2018-10-12 ENCOUNTER — Other Ambulatory Visit: Payer: Self-pay

## 2018-10-12 VITALS — BP 115/50 | HR 70 | Temp 97.7°F | Resp 18 | Ht 66.0 in | Wt 161.5 lb

## 2018-10-12 DIAGNOSIS — I10 Essential (primary) hypertension: Secondary | ICD-10-CM

## 2018-10-12 DIAGNOSIS — I25118 Atherosclerotic heart disease of native coronary artery with other forms of angina pectoris: Secondary | ICD-10-CM

## 2018-10-12 DIAGNOSIS — E782 Mixed hyperlipidemia: Secondary | ICD-10-CM | POA: Diagnosis not present

## 2018-10-12 DIAGNOSIS — Z794 Long term (current) use of insulin: Secondary | ICD-10-CM

## 2018-10-12 DIAGNOSIS — Z01810 Encounter for preprocedural cardiovascular examination: Secondary | ICD-10-CM

## 2018-10-12 DIAGNOSIS — E118 Type 2 diabetes mellitus with unspecified complications: Secondary | ICD-10-CM | POA: Diagnosis not present

## 2018-10-12 NOTE — Progress Notes (Signed)
Cardiology Clinic Note   Patient Name: Makayla Fischer Date of Encounter: 10/12/2018  Primary Care Provider:  Jeanie Sewer, NP Primary Cardiologist:  Jenne Campus, MD Electrophysiologist:  None   Patient Profile    Makayla Fischer is a 58 y.o. female with a hx of COPD, CAD (s/p BMS 02/2008 and DES to ostial/mid LCx 08/2018), DM2, opiate dependence, tobacco use, HTN. Presents today for preoperative clearance for galbladder removal  Past Medical History    Past Medical History:  Diagnosis Date  . Anginal pain ([place])   . Anxiety    h/o panic attacks   . Arthritis    all over, back, knees   . CAD (coronary artery disease)   . Chronic coronary artery disease 08/10/2017  . Chronic pain   . Daily consumption of alcohol 08/10/2017  . Depression   . Essential hypertension 08/10/2017  . Fatty liver    by 01/15/11 CT scan  . Fibromyalgia   . GERD (gastroesophageal reflux disease)   . Hyperlipidemia   . Hypertension    sees Dr. Posey Rea, stress test in 01/2011  . Hypomagnesemia 08/10/2017  . Hyponatremia 08/10/2017  . Incisional hernia    with obstruction  . Myocardial infarction ([place]) 02/2008  . Neuromuscular disorder ([facility])    neuropathy  . Osteoporosis   . Shortness of breath   . Substance abuse ([place])    drugs and alcohol  . Tobacco abuse 08/10/2017  . Type II diabetes mellitus, well controlled ([place]) 08/10/2017  . Uncomplicated opioid dependence ([place]) 08/10/2017  . Varicose veins of leg with complications [id number]  . Varicose veins of lower extremities with other complications [id number]   Past Surgical History:  Procedure Laterality Date  . CARDIAC CATHETERIZATION     [id number] [facility], x2 stents   . CARDIAC SURGERY    . Foley  . TONSILLECTOMY AND ADENOIDECTOMY    . TUBAL LIGATION    . VENTRAL HERNIA REPAIR  07/12/2011   Procedure: LAPAROSCOPIC VENTRAL HERNIA;  Surgeon: Adin Hector, MD;  Location: [place];  Service:  General;  Laterality: N/A;  laparoscopic repair incisional hernia with mesh    Allergies  Allergies  Allergen Reactions  . Septra [Bactrim] Shortness Of Breath and Itching  . Sulfamethoxazole-Trimethoprim Itching and Shortness Of Breath    History of Present Illness   Makayla [place] is a 58 y.o. female with a hx of COPD, (CAD s/p BMS 02/2008 and DES of ostial to mid LCx 08/2018), DM2, opiate dependence, tobacco use, HTN. Presents today for preoperative clearance for gladbladder removal.  She was followed a number of years ago by Dr. Agustin Cree and requests to reestablish cardiology care.  ED 09/13/18 for chest pain at [facility]. She had a left heart cath on 09/14/18 with PCI and DES to ostial to mid LCx. Noted LAD 60% stenosis with FFR 0.71 and 60% focal stenosis in prox LAD with "borderline significant FFR 0.79" recommended for stage PCI if recurrent chest pain. She was recommended for DAPT with aspirin and plavix for 6 months.   Underwent cardiac catheterization in 2004, 2010, and most recently in 08/2018. She reports she has had lots of stress over the last year related to family situations. She has been home about a month and reports feeling overall well.  Tells me she is taken 2 nitroglycerin for chest pain but chest pain resolved with the use of single tablet of nitroglycerin.  She has increased her activity  level and has been walking 10 minutes daily outdoors without chest pain or DOE.  She was referred to cardiac rehab at [facility] but declined-after discussion she is agreeable to consider and I will place a new referral.  Tells me she has made dramatic changes to her diet for her gallbladder and tells me it has not really been bothering her.  She plans to wait for laparoscopic cholecystectomy and does not have a surgery date set.  She has resumed smoking 1 PPD. She is interested in quitting. We discussed cessation methods. She is planning to call 1800QUITNOW to obtain free nicotine  patches. Tobacco cessation encouraged.   EKGs/Labs/Other Studies Reviewed:   The following studies were reviewed today:  09/14/18 cardiac cath at [facility] Findings: 1. There is significant proximal to mid Lcx disease (60% stenosis, FFR  0.71). 2. There is a 60% focal stenosis in the proximal LAD with borderline  significant FFR, 0.79). 3. Successful PCI to the ostial to mid Lcx with the placement of a Onyx  (2.75x22) DES with excellent angiographic result and TIMI 3 flow. 4. Please see Dr. Thompson Caul note of the same date for complete details  regarding diagnotic angiogram.   Recommendations: 1. Dual antiplatelet therapy with aspirin and Clopidogrel for at least 6  months, ideally longer. 2. Cardiac rehab referral. 3. Return to inpatient team for further management. 4. If recurring chest pain can plan for stage PCI of the LAD.  Complications:  None  Echo 09/15/18 at [facility]  Normal left ventricular systolic function, ejection fraction > 55%  Mitral annular calcification  Degenerative mitral valve disease  Normal right ventricular systolic function  Technically difficult study due to chest wall/lung interference  Recent Labs: 09/14/2018 via care everywhere:   Ferritin 6.5, hemoglobin 9.6, hematocrit 30.3, RBC 3.93, WBC 3.7  Troponin less than 0.033  proBNP 312  K4.4, creatinine 0.58, calcium 9.0, GFR greater than 90, glucose 119  Recent Lipid Panel 09/14/2018 via care everywhere: Total cholesterol 135, HDL 33, triglycerides 177, LDL 67  Home Medications      Current Meds  Medication Sig  . aspirin 81 MG tablet Take 81 mg by mouth daily.    Marland Kitchen atorvastatin (LIPITOR) 40 MG tablet Take 40 mg by mouth daily.  . Buprenorphine HCl-Naloxone HCl (SUBOXONE) 8-2 MG FILM Place 1 Film under the tongue 2 (two) times daily.  . carvedilol (COREG) 6.25 MG tablet Take 6.25 mg by mouth 2 (two) times daily with a meal.   . clonazePAM (KLONOPIN) 1 MG tablet Take 1 mg by mouth 2 (two) times  daily as needed. For anxiety  . clopidogrel (PLAVIX) 75 MG tablet Take 75 mg by mouth daily.  . cyclobenzaprine (FLEXERIL) 10 MG tablet Take 10 mg by mouth 3 (three) times daily as needed. For muscle pain  . gabapentin (NEURONTIN) 300 MG capsule Take 600 mg by mouth 4 (four) times daily.   Marland Kitchen glipiZIDE (GLUCOTROL) 5 MG tablet Take 5 mg by mouth daily before breakfast.  . hydrochlorothiazide (HYDRODIURIL) 12.5 MG tablet Take by mouth daily. Take 12.5 mg by mouth daily  . isosorbide mononitrate (IMDUR) 30 MG 24 hr tablet Take 30 mg by mouth daily after lunch daily after lunch.   . lisinopril (PRINIVIL,ZESTRIL) 20 MG tablet Take 20 mg by mouth daily after lunch daily after lunch.   . nitroGLYCERIN (NITROSTAT) 0.4 MG SL tablet Place 0.4 mg under the tongue every 5 (five) minutes as needed. For chest pain  . pantoprazole (PROTONIX) 20 MG  tablet Take 20 mg by mouth daily.     Family History    Family History  Problem Relation Age of Onset  . Diabetes Mother   . Hyperlipidemia Mother   . Hypertension Mother   . Heart disease Mother   . Cancer Father        lymphoma  . Anesthesia problems Neg Hx    She indicated that her mother is deceased. She indicated that her father is deceased. She indicated that the status of her neg hx is unknown.  Social History    Social History   Socioeconomic History  . Marital status: Legally Separated    Spouse name: Not on file  . Number of children: Not on file  . Years of education: Not on file  . Highest education level: Not on file  Occupational History  . Not on file  Social Needs  . Financial resource strain: Not on file  . Food insecurity    Worry: Not on file    Inability: Not on file  . Transportation needs    Medical: Not on file    Non-medical: Not on file  Tobacco Use  . Smoking status: Current Every Day Smoker    Packs/day: 0.50    Years: 28.00    Pack years: 14.00    Types: Cigarettes  . Smokeless tobacco: Never Used  Substance  and Sexual Activity  . Alcohol use: Yes    Alcohol/week: 6.0 standard drinks    Types: 6 Cans of beer per week    Comment: 6 pack of beer / week  . Drug use: No    Types: Heroin    Comment: 1.5 yr. use of Heroin- 1990's   . Sexual activity: Not on file  Lifestyle  . Physical activity    Days per week: Not on file    Minutes per session: Not on file  . Stress: Not on file  Relationships  . Social Herbalist on phone: Not on file    Gets together: Not on file    Attends religious service: Not on file    Active member of club or organization: Not on file    Attends meetings of clubs or organizations: Not on file    Relationship status: Not on file  . Intimate partner violence    Fear of current or ex partner: Not on file    Emotionally abused: Not on file    Physically abused: Not on file    Forced sexual activity: Not on file  Other Topics Concern  . Not on file  Social History Narrative  . Not on file    Review of Systems    Review of Systems  Constitution: Negative for chills, fever and malaise/fatigue.  Cardiovascular: Negative for chest pain, dyspnea on exertion, irregular heartbeat, leg swelling, near-syncope and palpitations.  Respiratory: Negative for cough, shortness of breath and wheezing.   Hematologic/Lymphatic: Bruises/bleeds easily (since starting plavix bruising only).  Gastrointestinal: Negative for nausea and vomiting.   Physical Exam    VS:  BP (!) 115/50 (BP Location: Left Arm, Patient Position: Sitting, Cuff Size: Normal)   Pulse 70   Temp 97.7 F (36.5 C) (Temporal)   Resp 18   Ht 5\' 6"  (1.676 m)   Wt 161 lb 8 oz (73.3 kg)   SpO2 95%   BMI 26.07 kg/m  , BMI Body mass index is 26.07 kg/m. GEN: Well nourished, well developed, in no acute distress. HEENT: Normal.  Neck: Supple, no JVD, carotid bruits, or masses. Cardiac: RRR, no murmurs, rubs, or gallops. No clubbing, cyanosis, edema.  Radials/DP/PT 2+ and equal bilaterally.   Respiratory:  Respirations regular and unlabored, clear to auscultation bilaterally. GI: Soft, nontender, nondistended, BS + x 4. Hernia noted.  MS: No deformity or atrophy. Skin: Warm and dry, no rash. Neuro:  Strength and sensation are intact. Psych: Normal affect.  Accessory Clinical Findings    ECG personally reviewed by me today- SR rate 71 bpm with no acute St/T wave changes - No acute changes  Assessment & Plan   1. CAD -s/p BMS in 2010 and DES to LCx 09/14/2018.  Echo 08/2018 with normal LVEF.  Recommended for DAPT with aspirin and Plavix for 6 months.  Continue GDMT aspirin, Plavix, high intensity statin, beta-blocker, Imdur, PRN nitroglycerin. She will be referred to cardiac rehab at [facility] - she had previously declined but was agreeable to reconsider. Continue regular walking routine and gradually increase to 30 minutes per day.   2. Preoperative examination - Presents today for preoperative examination for laparoscopic cholecystectomy.  Tells me she is no longer symptomatic after making changes to her diet. No date set for surgery.  According to the Revised Cardiac Risk Index (RCRI), her Perioperative Risk of Major Cardiac Event is (%): 0.9 Her Functional Capacity in METs is: 6.05 according to the Duke Activity Status Index (DASI).  In setting of DES on 09/14/2018 she needs to remain on uninterrupted DAPT for a minimum of 6 months.  She verbalizes understanding of this and is agreeable to wait until that time to undergo a laparoscopic cholecystectomy unless her surgeon is willing to proceed while she is on Plavix and Aspirin.   Will print and fax this note to Dr. Orrin Brigham at [facility] in [place].    3. HTN - BP well controlled today.  She checks intermittently at home and reports good readings.  Continue present antihypertensive regimen.  She was asked to let us know if her systolic blood pressures consistently greater than 130 or less than 110.  4.  HLD, LDL goal <70 - Changed from pravastatin to atorvastatin while hospitalized. LDL at goal with lipid profile 09/14/18 HDL 33, LDL 67.   5. DM2 - Follows with her PCP.  Reports her blood glucose when checked at home are routinely less than 200.  If additional agent needed consider SGLT2i such as Jardiance for cardioprotective benefit.   6. Tobacco abuse - Smoking 1 PPD. She is interested in quitting. She plans to utilize 1-800-QUITNOW to obtain nicotine patches. Encouraged her to continue her cessation efforts.   Disposition: Follow up in 3 month(s) with Dr. Agustin Cree.  Loel Dubonnet, NP  10/12/2018, 1:56 PM

## 2018-10-12 NOTE — Patient Instructions (Addendum)
Medication Instructions:  No medication changes today.   If you need a refill on your cardiac medications before your next appointment, please call your pharmacy.   Lab work: No lab work today.   If you have labs (blood work) drawn today and your tests are completely normal, you will receive your results only by: Marland Kitchen MyChart Message (if you have MyChart) OR . A paper copy in the mail If you have any lab test that is abnormal or we need to change your treatment, we will call you to review the results.  Testing/Procedures: You had an EKG today.   Follow-Up: At [facility], you and your health needs are our priority.  As part of our continuing mission to provide you with exceptional heart care, we have created designated Provider Care Teams.  These Care Teams include your primary Cardiologist (physician) and Advanced Practice Providers (APPs -  Physician Assistants and Nurse Practitioners) who all work together to provide you with the care you need, when you need it. You will need a follow up appointment in 3 months.  You may see any member of our [employer] Provider Team in [place]: Shirlee More, MD . Jyl Heinz, MD . Jenne Campus, MD . Berniece Salines, MD  Any Other Special Instructions Will Be Listed Below (If Applicable).   Recommend you quit smoking like we talked about. I like your plan of calling 1-800-QUITNOW , they have great resources!    Check blood pressure routinely at home. If top number is consistently >130 please call our office. If top number is consistently <110 please call our office.    Would not recommend stopping Plavix nor Aspirin for at least 6 months. We will send this recommendation to your surgeon. We can re-evaluate your Plavix after 6 months.    We will send another referral to cardiac rehabilitation at [facility].

## 2018-10-12 NOTE — Addendum Note (Signed)
Addended by: Loel Dubonnet on: 10/12/2018 03:07 PM   Modules accepted: Orders

## 2018-10-13 DIAGNOSIS — Z79891 Long term (current) use of opiate analgesic: Secondary | ICD-10-CM | POA: Diagnosis not present

## 2018-10-13 DIAGNOSIS — F1910 Other psychoactive substance abuse, uncomplicated: Secondary | ICD-10-CM | POA: Diagnosis not present

## 2018-10-13 DIAGNOSIS — K047 Periapical abscess without sinus: Secondary | ICD-10-CM | POA: Diagnosis not present

## 2018-10-13 DIAGNOSIS — F1994 Other psychoactive substance use, unspecified with psychoactive substance-induced mood disorder: Secondary | ICD-10-CM | POA: Diagnosis not present

## 2018-10-13 DIAGNOSIS — F1120 Opioid dependence, uncomplicated: Secondary | ICD-10-CM | POA: Diagnosis not present

## 2018-10-13 DIAGNOSIS — Z6826 Body mass index (BMI) 26.0-26.9, adult: Secondary | ICD-10-CM | POA: Diagnosis not present

## 2018-10-13 DIAGNOSIS — F1123 Opioid dependence with withdrawal: Secondary | ICD-10-CM | POA: Diagnosis not present

## 2018-10-14 ENCOUNTER — Other Ambulatory Visit: Payer: Self-pay | Admitting: Family

## 2018-10-14 ENCOUNTER — Telehealth: Payer: Self-pay | Admitting: Family

## 2018-10-14 DIAGNOSIS — E782 Mixed hyperlipidemia: Secondary | ICD-10-CM

## 2018-10-14 DIAGNOSIS — I25118 Atherosclerotic heart disease of native coronary artery with other forms of angina pectoris: Secondary | ICD-10-CM

## 2018-10-14 MED ORDER — CLOPIDOGREL BISULFATE 75 MG PO TABS: 75.0000 mg | ORAL_TABLET | Freq: Every day | ORAL | 1 refills | Status: DC

## 2018-10-14 MED ORDER — ATORVASTATIN CALCIUM 40 MG PO TABS: 40.0000 mg | ORAL_TABLET | Freq: Every day | ORAL | 1 refills | Status: DC

## 2018-10-14 NOTE — Telephone Encounter (Signed)
°*  STAT* If patient is at the pharmacy, call can be transferred to refill team.   1. Which medications need to be refilled? (please list name of each medication and dose if known) Atorvastatin 80mg , Clopidogrel 75mg   2. Which pharmacy/location (including street and city if local pharmacy) is medication to be sent to?[facility] in [place]  3. Do they need a 30 day or 90 day supply? [place]

## 2018-10-14 NOTE — Telephone Encounter (Signed)
Refill sent per patient's request to preferred pharmacy.   Ordered under "orders only" encounter.   Loel Dubonnet, NP

## 2018-10-20 ENCOUNTER — Telehealth: Payer: Self-pay | Admitting: Family

## 2018-10-20 DIAGNOSIS — I25118 Atherosclerotic heart disease of native coronary artery with other forms of angina pectoris: Secondary | ICD-10-CM

## 2018-10-20 DIAGNOSIS — E782 Mixed hyperlipidemia: Secondary | ICD-10-CM

## 2018-10-20 MED ORDER — ATORVASTATIN CALCIUM 80 MG PO TABS: 80.0000 mg | ORAL_TABLET | Freq: Every day | ORAL | 1 refills | Status: DC

## 2018-10-20 NOTE — Telephone Encounter (Signed)
Called Makayla Fischer - confirmed correct pharmacy and new prescription for Atorvastatin 80mg  daily was sent.   Updated our medical record. Atorvastatin 80mg  was prescribed at time of discharge from [facility] after recent stent.   She will use up her 40mg  tablets by taking two and then transition to the 80mg  tablet.   Loel Dubonnet, NP

## 2018-10-20 NOTE — Telephone Encounter (Signed)
Atorvastatin was refilled for 40mg  instead of 80mg . Please advise.

## 2018-10-27 DIAGNOSIS — F1120 Opioid dependence, uncomplicated: Secondary | ICD-10-CM | POA: Diagnosis not present

## 2018-10-27 DIAGNOSIS — F11988 Opioid use, unspecified with other opioid-induced disorder: Secondary | ICD-10-CM | POA: Diagnosis not present

## 2018-10-27 DIAGNOSIS — F1123 Opioid dependence with withdrawal: Secondary | ICD-10-CM | POA: Diagnosis not present

## 2018-10-27 DIAGNOSIS — Z79891 Long term (current) use of opiate analgesic: Secondary | ICD-10-CM | POA: Diagnosis not present

## 2018-10-27 DIAGNOSIS — Z6825 Body mass index (BMI) 25.0-25.9, adult: Secondary | ICD-10-CM | POA: Diagnosis not present

## 2018-10-27 DIAGNOSIS — F1994 Other psychoactive substance use, unspecified with psychoactive substance-induced mood disorder: Secondary | ICD-10-CM | POA: Diagnosis not present

## 2018-10-27 DIAGNOSIS — F1910 Other psychoactive substance abuse, uncomplicated: Secondary | ICD-10-CM | POA: Diagnosis not present

## 2018-10-28 DIAGNOSIS — K219 Gastro-esophageal reflux disease without esophagitis: Secondary | ICD-10-CM | POA: Diagnosis not present

## 2018-10-28 DIAGNOSIS — G8929 Other chronic pain: Secondary | ICD-10-CM | POA: Diagnosis not present

## 2018-10-28 DIAGNOSIS — J449 Chronic obstructive pulmonary disease, unspecified: Secondary | ICD-10-CM | POA: Diagnosis not present

## 2018-10-28 DIAGNOSIS — E11610 Type 2 diabetes mellitus with diabetic neuropathic arthropathy: Secondary | ICD-10-CM | POA: Diagnosis not present

## 2018-10-28 DIAGNOSIS — M549 Dorsalgia, unspecified: Secondary | ICD-10-CM | POA: Diagnosis not present

## 2018-10-28 DIAGNOSIS — K8020 Calculus of gallbladder without cholecystitis without obstruction: Secondary | ICD-10-CM | POA: Diagnosis not present

## 2018-10-28 DIAGNOSIS — E782 Mixed hyperlipidemia: Secondary | ICD-10-CM | POA: Diagnosis not present

## 2018-10-28 DIAGNOSIS — Z23 Encounter for immunization: Secondary | ICD-10-CM | POA: Diagnosis not present

## 2018-10-28 DIAGNOSIS — I10 Essential (primary) hypertension: Secondary | ICD-10-CM | POA: Diagnosis not present

## 2018-11-11 DIAGNOSIS — F1910 Other psychoactive substance abuse, uncomplicated: Secondary | ICD-10-CM | POA: Diagnosis not present

## 2018-11-11 DIAGNOSIS — F1120 Opioid dependence, uncomplicated: Secondary | ICD-10-CM | POA: Diagnosis not present

## 2018-11-11 DIAGNOSIS — Z79891 Long term (current) use of opiate analgesic: Secondary | ICD-10-CM | POA: Diagnosis not present

## 2018-11-11 DIAGNOSIS — Z6826 Body mass index (BMI) 26.0-26.9, adult: Secondary | ICD-10-CM | POA: Diagnosis not present

## 2018-11-11 DIAGNOSIS — F1994 Other psychoactive substance use, unspecified with psychoactive substance-induced mood disorder: Secondary | ICD-10-CM | POA: Diagnosis not present

## 2018-11-11 DIAGNOSIS — Z87891 Personal history of nicotine dependence: Secondary | ICD-10-CM | POA: Diagnosis not present

## 2018-11-11 DIAGNOSIS — F1123 Opioid dependence with withdrawal: Secondary | ICD-10-CM | POA: Diagnosis not present

## 2018-11-25 DIAGNOSIS — Z6826 Body mass index (BMI) 26.0-26.9, adult: Secondary | ICD-10-CM | POA: Diagnosis not present

## 2018-11-25 DIAGNOSIS — F1994 Other psychoactive substance use, unspecified with psychoactive substance-induced mood disorder: Secondary | ICD-10-CM | POA: Diagnosis not present

## 2018-11-25 DIAGNOSIS — Z87891 Personal history of nicotine dependence: Secondary | ICD-10-CM | POA: Diagnosis not present

## 2018-11-25 DIAGNOSIS — F1120 Opioid dependence, uncomplicated: Secondary | ICD-10-CM | POA: Diagnosis not present

## 2018-11-25 DIAGNOSIS — F1910 Other psychoactive substance abuse, uncomplicated: Secondary | ICD-10-CM | POA: Diagnosis not present

## 2018-11-25 DIAGNOSIS — F1123 Opioid dependence with withdrawal: Secondary | ICD-10-CM | POA: Diagnosis not present

## 2018-11-25 DIAGNOSIS — Z79891 Long term (current) use of opiate analgesic: Secondary | ICD-10-CM | POA: Diagnosis not present

## 2018-12-09 DIAGNOSIS — Z79891 Long term (current) use of opiate analgesic: Secondary | ICD-10-CM | POA: Diagnosis not present

## 2018-12-09 DIAGNOSIS — F11988 Opioid use, unspecified with other opioid-induced disorder: Secondary | ICD-10-CM | POA: Diagnosis not present

## 2018-12-09 DIAGNOSIS — Z87891 Personal history of nicotine dependence: Secondary | ICD-10-CM | POA: Diagnosis not present

## 2018-12-09 DIAGNOSIS — Z23 Encounter for immunization: Secondary | ICD-10-CM | POA: Diagnosis not present

## 2018-12-09 DIAGNOSIS — F1994 Other psychoactive substance use, unspecified with psychoactive substance-induced mood disorder: Secondary | ICD-10-CM | POA: Diagnosis not present

## 2018-12-09 DIAGNOSIS — F1123 Opioid dependence with withdrawal: Secondary | ICD-10-CM | POA: Diagnosis not present

## 2018-12-09 DIAGNOSIS — F1910 Other psychoactive substance abuse, uncomplicated: Secondary | ICD-10-CM | POA: Diagnosis not present

## 2018-12-09 DIAGNOSIS — Z6826 Body mass index (BMI) 26.0-26.9, adult: Secondary | ICD-10-CM | POA: Diagnosis not present

## 2018-12-09 DIAGNOSIS — F1120 Opioid dependence, uncomplicated: Secondary | ICD-10-CM | POA: Diagnosis not present

## 2018-12-23 DIAGNOSIS — F1910 Other psychoactive substance abuse, uncomplicated: Secondary | ICD-10-CM | POA: Diagnosis not present

## 2018-12-23 DIAGNOSIS — Z79891 Long term (current) use of opiate analgesic: Secondary | ICD-10-CM | POA: Diagnosis not present

## 2018-12-23 DIAGNOSIS — F1994 Other psychoactive substance use, unspecified with psychoactive substance-induced mood disorder: Secondary | ICD-10-CM | POA: Diagnosis not present

## 2018-12-23 DIAGNOSIS — F1123 Opioid dependence with withdrawal: Secondary | ICD-10-CM | POA: Diagnosis not present

## 2018-12-23 DIAGNOSIS — F1120 Opioid dependence, uncomplicated: Secondary | ICD-10-CM | POA: Diagnosis not present

## 2018-12-23 DIAGNOSIS — Z6826 Body mass index (BMI) 26.0-26.9, adult: Secondary | ICD-10-CM | POA: Diagnosis not present

## 2018-12-29 ENCOUNTER — Ambulatory Visit: Payer: Medicare HMO | Admitting: Cardiology

## 2019-01-06 DIAGNOSIS — Z6826 Body mass index (BMI) 26.0-26.9, adult: Secondary | ICD-10-CM | POA: Diagnosis not present

## 2019-01-06 DIAGNOSIS — F1123 Opioid dependence with withdrawal: Secondary | ICD-10-CM | POA: Diagnosis not present

## 2019-01-06 DIAGNOSIS — F1120 Opioid dependence, uncomplicated: Secondary | ICD-10-CM | POA: Diagnosis not present

## 2019-01-06 DIAGNOSIS — Z79891 Long term (current) use of opiate analgesic: Secondary | ICD-10-CM | POA: Diagnosis not present

## 2019-01-06 DIAGNOSIS — F1910 Other psychoactive substance abuse, uncomplicated: Secondary | ICD-10-CM | POA: Diagnosis not present

## 2019-01-06 DIAGNOSIS — F1994 Other psychoactive substance use, unspecified with psychoactive substance-induced mood disorder: Secondary | ICD-10-CM | POA: Diagnosis not present

## 2019-01-20 ENCOUNTER — Encounter: Payer: Self-pay | Admitting: Cardiology

## 2019-01-20 ENCOUNTER — Ambulatory Visit (INDEPENDENT_AMBULATORY_CARE_PROVIDER_SITE_OTHER): Payer: Medicare HMO | Admitting: Cardiology

## 2019-01-20 ENCOUNTER — Other Ambulatory Visit: Payer: Self-pay

## 2019-01-20 VITALS — BP 146/66 | HR 65 | Ht 66.0 in | Wt 158.6 lb

## 2019-01-20 DIAGNOSIS — I10 Essential (primary) hypertension: Secondary | ICD-10-CM

## 2019-01-20 DIAGNOSIS — Z6826 Body mass index (BMI) 26.0-26.9, adult: Secondary | ICD-10-CM | POA: Diagnosis not present

## 2019-01-20 DIAGNOSIS — I2510 Atherosclerotic heart disease of native coronary artery without angina pectoris: Secondary | ICD-10-CM | POA: Diagnosis not present

## 2019-01-20 DIAGNOSIS — Z720 Tobacco use: Secondary | ICD-10-CM | POA: Diagnosis not present

## 2019-01-20 DIAGNOSIS — F1120 Opioid dependence, uncomplicated: Secondary | ICD-10-CM | POA: Diagnosis not present

## 2019-01-20 DIAGNOSIS — F1910 Other psychoactive substance abuse, uncomplicated: Secondary | ICD-10-CM | POA: Diagnosis not present

## 2019-01-20 DIAGNOSIS — F1123 Opioid dependence with withdrawal: Secondary | ICD-10-CM | POA: Diagnosis not present

## 2019-01-20 DIAGNOSIS — Z79891 Long term (current) use of opiate analgesic: Secondary | ICD-10-CM | POA: Diagnosis not present

## 2019-01-20 DIAGNOSIS — F11988 Opioid use, unspecified with other opioid-induced disorder: Secondary | ICD-10-CM | POA: Diagnosis not present

## 2019-01-20 DIAGNOSIS — E119 Type 2 diabetes mellitus without complications: Secondary | ICD-10-CM | POA: Diagnosis not present

## 2019-01-20 DIAGNOSIS — F1994 Other psychoactive substance use, unspecified with psychoactive substance-induced mood disorder: Secondary | ICD-10-CM | POA: Diagnosis not present

## 2019-01-20 NOTE — Progress Notes (Signed)
Cardiology Office Note:    Date:  01/20/2019   ID:  Makayla Fischer, DOB 22-Oct-1960, MRN [id number]  PCP:  Jeanie Sewer, NP  Cardiologist:  Jenne Campus, MD    Referring MD: Jeanie Sewer, NP   Chief Complaint  Patient presents with  . Follow-up  Doing well  History of Present Illness:    Makayla Fischer is a 58 y.o. female with coronary disease multiple intervention last time in August of this year when she required PTCA and stenting to circumflex artery.  Since that time should be on dual antiplatelet therapy.  Also history of smoking, dyslipidemia, essential hypertension.  Comes today to my office for follow-up of all seems to be doing well.  Denies have any chest pain, tightness, pressure, burning in the chest.  She does have chronic gallbladder disease and surgery is contemplated.  Since she does not have much symptoms from gallbladder point review as long as she is sticking with good diet I recommended to wait until 6 months with past with dual antiplatelet therapy before interrupting it for surgery.  She understand this and she is willing to continue to new taking her medications for now.  Past Medical History:  Diagnosis Date  . Anginal pain ([place])   . Anxiety    h/o panic attacks   . Arthritis    all over, back, knees   . CAD (coronary artery disease)   . Chronic coronary artery disease 08/10/2017  . Chronic pain   . Daily consumption of alcohol 08/10/2017  . Depression   . Essential hypertension 08/10/2017  . Fatty liver    by 01/15/11 CT scan  . Fibromyalgia   . GERD (gastroesophageal reflux disease)   . Hyperlipidemia   . Hypertension    sees Dr. Posey Rea, stress test in 01/2011  . Hypomagnesemia 08/10/2017  . Hyponatremia 08/10/2017  . Incisional hernia    with obstruction  . Myocardial infarction ([place]) 02/2008  . Neuromuscular disorder (H[facility])    neuropathy  . Osteoporosis   . Shortness of breath   . Substance abuse ([place])    drugs and  alcohol  . Tobacco abuse 08/10/2017  . Type II diabetes mellitus, well controlled ([place]) 08/10/2017  . Uncomplicated opioid dependence ([place]) 08/10/2017  . Varicose veins of leg with complications 123XX123  . Varicose veins of lower extremities with other complications XX123456    Past Surgical History:  Procedure Laterality Date  . CARDIAC [facility]IZATION     [facility]   . CARDIAC SURGERY    . [place]  . TONSILLECTOMY AND ADENOIDECTOMY    . TUBAL LIGATION    . VENTRAL HERNIA REPAIR  07/12/2011   Procedure: LAPAROSCOPIC VENTRAL HERNIA;  Surgeon: Adin Hector, MD;  Location: [place];  Service: General;  Laterality: N/A;  laparoscopic repair incisional hernia with mesh    Current Medications: Current Meds  Medication Sig  . albuterol (VENTOLIN HFA) 108 (90 Base) MCG/ACT inhaler Inhale 2 puffs into the lungs every 6 (six) hours as needed.  . Ascorbic Acid (VITAMIN C) 500 MG CAPS Take 1 capsule by mouth daily.  Marland Kitchen aspirin 81 MG tablet Take 81 mg by mouth daily.    Marland Kitchen atorvastatin (LIPITOR) 80 MG tablet Take 1 tablet (80 mg total) by mouth daily.  . Buprenorphine HCl-Naloxone HCl (SUBOXONE) 8-2 MG FILM Place 1 Film under the tongue 2 (two) times daily.  . carvedilol (COREG) 6.25 MG tablet Take  6.25 mg by mouth 2 (two) times daily with a meal.   . clonazePAM (KLONOPIN) 0.5 MG tablet Take 0.5 mg by mouth 2 (two) times daily as needed. For anxiety  . clopidogrel (PLAVIX) 75 MG tablet Take 1 tablet (75 mg total) by mouth daily.  . cyclobenzaprine (FLEXERIL) 10 MG tablet Take 10 mg by mouth 3 (three) times daily as needed. For muscle pain  . folic acid (FOLVITE) A999333 MCG tablet Take 1 tablet by mouth daily.  Marland Kitchen gabapentin (NEURONTIN) 300 MG capsule Take 600 mg by mouth 4 (four) times daily.   Marland Kitchen glipiZIDE (GLUCOTROL) 5 MG tablet Take 2.5 mg by mouth daily before breakfast.   . hydrochlorothiazide (HYDRODIURIL) 12.5 MG tablet Take by mouth daily. Take  12.5 mg by mouth daily  . isosorbide mononitrate (IMDUR) 30 MG 24 hr tablet Take 30 mg by mouth daily after lunch daily after lunch.   . lisinopril (PRINIVIL,ZESTRIL) 20 MG tablet Take 20 mg by mouth daily after lunch daily after lunch.   . magnesium oxide (MAG-OX) 400 MG tablet Take 1 tablet by mouth 2 (two) times daily.  . metFORMIN (GLUCOPHAGE) 500 MG tablet Take 500 mg by mouth 2 (two) times daily with a meal.  . Multiple Vitamins-Minerals (MULTIVITAMIN WITH MINERALS) tablet Take 1 tablet by mouth daily.    . nitroGLYCERIN (NITROSTAT) 0.4 MG SL tablet Place 0.4 mg under the tongue every 5 (five) minutes as needed. For chest pain  . pantoprazole (PROTONIX) 20 MG tablet Take 20 mg by mouth daily.  . Potassium (GNP POTASSIUM) 99 MG TABS Take 2 tablets by mouth daily.     Allergies:   Septra [bactrim] and Sulfamethoxazole-trimethoprim   Social History   Socioeconomic History  . Marital status: Legally Separated    Spouse name: Not on file  . Number of children: Not on file  . Years of education: Not on file  . Highest education level: Not on file  Occupational History  . Not on file  Tobacco Use  . Smoking status: Current Every Day Smoker    Packs/day: 1.00    Years: 28.00    Pack years: 28.00    Types: Cigarettes  . Smokeless tobacco: Never Used  Substance and Sexual Activity  . Alcohol use: Yes    Alcohol/week: 6.0 standard drinks    Types: 6 Cans of beer per week    Comment: 6 pack of beer / week  . Drug use: No    Types: Heroin    Comment: 1.5 yr. use of Heroin- 1990's   . Sexual activity: Not Currently  Other Topics Concern  . Not on file  Social History Narrative  . Not on file   Social Determinants of Health   Financial Resource Strain:   . Difficulty of Paying Living Expenses: Not on file  Food Insecurity:   . Worried About Charity fundraiser in the Last Year: Not on file  . Ran Out of Food in the Last Year: Not on file  Transportation Needs:   . Lack of  Transportation (Medical): Not on file  . Lack of Transportation (Non-Medical): Not on file  Physical Activity:   . Days of Exercise per Week: Not on file  . Minutes of Exercise per Session: Not on file  Stress:   . Feeling of Stress : Not on file  Social Connections:   . Frequency of Communication with Friends and Family: Not on file  . Frequency of Social Gatherings with Friends and  Family: Not on file  . Attends Religious Services: Not on file  . Active Member of Clubs or Organizations: Not on file  . Attends Archivist Meetings: Not on file  . Marital Status: Not on file     Family History: The patient's family history includes Cancer in her father; Diabetes in her mother; Heart disease in her mother; Hyperlipidemia in her mother; Hypertension in her mother. There is no history of Anesthesia problems. ROS:   Please see the history of present illness.    All 14 point review of systems negative except as described per history of present illness  EKGs/Labs/Other Studies Reviewed:      Recent Labs: No results found for requested labs within last 8760 hours.  Recent Lipid Panel No results found for: CHOL, TRIG, HDL, CHOLHDL, VLDL, LDLCALC, LDLDIRECT  Physical Exam:    VS:  BP (!) 146/66   Pulse 65   Ht 5\' 6"  (1.676 m)   Wt 158 lb 9.6 oz (71.9 kg)   SpO2 98%   BMI 25.60 kg/m     Wt Readings from Last 3 Encounters:  01/20/19 158 lb 9.6 oz (71.9 kg)  10/12/18 161 lb 8 oz (73.3 kg)  06/28/14 167 lb (75.8 kg)     GEN:  Well nourished, well developed in no acute distress HEENT: Normal NECK: No JVD; No carotid bruits LYMPHATICS: No lymphadenopathy CARDIAC: RRR, no murmurs, no rubs, no gallops RESPIRATORY:  Clear to auscultation without rales, wheezing or rhonchi  ABDOMEN: Soft, non-tender, non-distended MUSCULOSKELETAL:  No edema; No deformity  SKIN: Warm and dry LOWER EXTREMITIES: no swelling NEUROLOGIC:  Alert and oriented x 3 PSYCHIATRIC:  Normal affect     ASSESSMENT:    1. Type II diabetes mellitus, well controlled ([place])   2. Tobacco abuse   3. Coronary artery disease involving native coronary artery of native heart without angina pectoris   4. Essential hypertension    PLAN:    In order of problems listed above:  1. Coronary artery disease stable from that point review on dual antiplatelet therapy which I will continue.  The key right now will be risk factors modifications.  We did review her cardiac catheterization. 2. Dyslipidemia last fasting lipid profile was excellent on Lipitor 80 which I will continue.  She is scheduled to see her primary care physician in January and she will have fasting lipid profile done there 3. Smoking obviously big problem we spent at least 5 minutes talking about it. 4. Need for gallbladder surgery she is doing well from symptoms point of view from gallbladder.  We will continue present management will consider doing surgery 6 months after stent implantation at that time we will be able to interrupt dual antiplatelets therapy.  However, after surgery she need to go back on dual antiplatelets   Medication Adjustments/Labs and Tests Ordered: Current medicines are reviewed at length with the patient today.  Concerns regarding medicines are outlined above.  No orders of the defined types were placed in this encounter.  Medication changes: No orders of the defined types were placed in this encounter.   Signed, Park Liter, MD, [facility] 01/20/2019 2:42 PM    [facility]

## 2019-01-20 NOTE — Patient Instructions (Signed)

## 2019-02-03 DIAGNOSIS — F1994 Other psychoactive substance use, unspecified with psychoactive substance-induced mood disorder: Secondary | ICD-10-CM | POA: Diagnosis not present

## 2019-02-03 DIAGNOSIS — F1123 Opioid dependence with withdrawal: Secondary | ICD-10-CM | POA: Diagnosis not present

## 2019-02-03 DIAGNOSIS — Z79891 Long term (current) use of opiate analgesic: Secondary | ICD-10-CM | POA: Diagnosis not present

## 2019-02-03 DIAGNOSIS — Z6825 Body mass index (BMI) 25.0-25.9, adult: Secondary | ICD-10-CM | POA: Diagnosis not present

## 2019-02-03 DIAGNOSIS — F1910 Other psychoactive substance abuse, uncomplicated: Secondary | ICD-10-CM | POA: Diagnosis not present

## 2019-02-03 DIAGNOSIS — F1120 Opioid dependence, uncomplicated: Secondary | ICD-10-CM | POA: Diagnosis not present

## 2019-02-05 DIAGNOSIS — K8020 Calculus of gallbladder without cholecystitis without obstruction: Secondary | ICD-10-CM | POA: Diagnosis not present

## 2019-02-05 DIAGNOSIS — K219 Gastro-esophageal reflux disease without esophagitis: Secondary | ICD-10-CM | POA: Diagnosis not present

## 2019-02-05 DIAGNOSIS — E878 Other disorders of electrolyte and fluid balance, not elsewhere classified: Secondary | ICD-10-CM | POA: Diagnosis not present

## 2019-02-05 DIAGNOSIS — M549 Dorsalgia, unspecified: Secondary | ICD-10-CM | POA: Diagnosis not present

## 2019-02-05 DIAGNOSIS — E782 Mixed hyperlipidemia: Secondary | ICD-10-CM | POA: Diagnosis not present

## 2019-02-05 DIAGNOSIS — E11610 Type 2 diabetes mellitus with diabetic neuropathic arthropathy: Secondary | ICD-10-CM | POA: Diagnosis not present

## 2019-02-05 DIAGNOSIS — J449 Chronic obstructive pulmonary disease, unspecified: Secondary | ICD-10-CM | POA: Diagnosis not present

## 2019-02-05 DIAGNOSIS — I10 Essential (primary) hypertension: Secondary | ICD-10-CM | POA: Diagnosis not present

## 2019-02-17 DIAGNOSIS — Z79891 Long term (current) use of opiate analgesic: Secondary | ICD-10-CM | POA: Diagnosis not present

## 2019-02-17 DIAGNOSIS — F1123 Opioid dependence with withdrawal: Secondary | ICD-10-CM | POA: Diagnosis not present

## 2019-02-17 DIAGNOSIS — F1120 Opioid dependence, uncomplicated: Secondary | ICD-10-CM | POA: Diagnosis not present

## 2019-02-17 DIAGNOSIS — F1910 Other psychoactive substance abuse, uncomplicated: Secondary | ICD-10-CM | POA: Diagnosis not present

## 2019-02-17 DIAGNOSIS — F1994 Other psychoactive substance use, unspecified with psychoactive substance-induced mood disorder: Secondary | ICD-10-CM | POA: Diagnosis not present

## 2019-02-17 DIAGNOSIS — Z6825 Body mass index (BMI) 25.0-25.9, adult: Secondary | ICD-10-CM | POA: Diagnosis not present

## 2019-03-10 DIAGNOSIS — Z79891 Long term (current) use of opiate analgesic: Secondary | ICD-10-CM | POA: Diagnosis not present

## 2019-03-10 DIAGNOSIS — F1123 Opioid dependence with withdrawal: Secondary | ICD-10-CM | POA: Diagnosis not present

## 2019-03-10 DIAGNOSIS — Z6825 Body mass index (BMI) 25.0-25.9, adult: Secondary | ICD-10-CM | POA: Diagnosis not present

## 2019-03-10 DIAGNOSIS — F1910 Other psychoactive substance abuse, uncomplicated: Secondary | ICD-10-CM | POA: Diagnosis not present

## 2019-03-10 DIAGNOSIS — F1120 Opioid dependence, uncomplicated: Secondary | ICD-10-CM | POA: Diagnosis not present

## 2019-03-10 DIAGNOSIS — F1994 Other psychoactive substance use, unspecified with psychoactive substance-induced mood disorder: Secondary | ICD-10-CM | POA: Diagnosis not present

## 2019-03-24 DIAGNOSIS — F1910 Other psychoactive substance abuse, uncomplicated: Secondary | ICD-10-CM | POA: Diagnosis not present

## 2019-03-24 DIAGNOSIS — Z6825 Body mass index (BMI) 25.0-25.9, adult: Secondary | ICD-10-CM | POA: Diagnosis not present

## 2019-03-24 DIAGNOSIS — F1123 Opioid dependence with withdrawal: Secondary | ICD-10-CM | POA: Diagnosis not present

## 2019-03-24 DIAGNOSIS — Z79891 Long term (current) use of opiate analgesic: Secondary | ICD-10-CM | POA: Diagnosis not present

## 2019-03-24 DIAGNOSIS — F1994 Other psychoactive substance use, unspecified with psychoactive substance-induced mood disorder: Secondary | ICD-10-CM | POA: Diagnosis not present

## 2019-03-24 DIAGNOSIS — F11988 Opioid use, unspecified with other opioid-induced disorder: Secondary | ICD-10-CM | POA: Diagnosis not present

## 2019-03-24 DIAGNOSIS — F1120 Opioid dependence, uncomplicated: Secondary | ICD-10-CM | POA: Diagnosis not present

## 2019-03-30 ENCOUNTER — Telehealth: Payer: Self-pay | Admitting: Emergency Medicine

## 2019-03-30 DIAGNOSIS — R079 Chest pain, unspecified: Secondary | ICD-10-CM

## 2019-03-30 DIAGNOSIS — K8010 Calculus of gallbladder with chronic cholecystitis without obstruction: Secondary | ICD-10-CM | POA: Diagnosis not present

## 2019-03-30 NOTE — Telephone Encounter (Signed)
Per Dr. Agustin Cree patient needs lexiscan. She was informed of appointment date and time and instructions below were reviewed.      You are scheduled for a Myocardial Perfusion Imaging Study on: 04/01/2019 at 8 am  Please arrive 15 minutes prior to your appointment time for registration and insurance purposes.  The test will take approximately 3 to 4 hours to complete; you may bring reading material.  If someone comes with you to your appointment, they will need to remain in the main lobby due to limited space in the testing area. **If you are pregnant or breastfeeding, please notify the nuclear lab prior to your appointment**  How to prepare for your Myocardial Perfusion Test: . Do not eat or drink 3 hours prior to your test, except you may have water. . Do not consume products containing caffeine (regular or decaffeinated) 12 hours prior to your test. (ex: coffee, chocolate, sodas, tea). Marland Kitchen HOLD Metformin and glipizide the day of the test.  . Do wear comfortable clothes (no dresses or overalls) and walking shoes, tennis shoes preferred (No heels or open toe shoes are allowed). . Do NOT wear cologne, perfume, aftershave, or lotions (deodorant is allowed). . If these instructions are not followed, your test will have to be rescheduled.    If you cannot keep your appointment, please provide 24 hours notification to the Nuclear Lab, to avoid a possible $50 charge to your account.

## 2019-04-01 ENCOUNTER — Ambulatory Visit (INDEPENDENT_AMBULATORY_CARE_PROVIDER_SITE_OTHER): Payer: Medicare HMO

## 2019-04-01 ENCOUNTER — Other Ambulatory Visit: Payer: Self-pay

## 2019-04-01 DIAGNOSIS — R079 Chest pain, unspecified: Secondary | ICD-10-CM

## 2019-04-01 LAB — MYOCARDIAL PERFUSION IMAGING
LV dias vol: 121 mL (ref 46–106)
LV sys vol: 41 mL
Peak HR: 81 {beats}/min
Rest HR: 60 {beats}/min
SDS: 0
SRS: 0
SSS: 0
TID: 1.01

## 2019-04-01 MED ORDER — TECHNETIUM TC 99M TETROFOSMIN IV KIT
10.9000 | PACK | Freq: Once | INTRAVENOUS | Status: AC | PRN
  Administered 2019-04-01: 10.9 via INTRAVENOUS

## 2019-04-01 MED ORDER — REGADENOSON 0.4 MG/5ML IV SOLN
0.4000 mg | Freq: Once | INTRAVENOUS | Status: AC
  Administered 2019-04-01: 0.4 mg via INTRAVENOUS

## 2019-04-01 MED ORDER — TECHNETIUM TC 99M TETROFOSMIN IV KIT
29.5000 | PACK | Freq: Once | INTRAVENOUS | Status: AC | PRN
  Administered 2019-04-01: 29.5 via INTRAVENOUS

## 2019-04-04 ENCOUNTER — Other Ambulatory Visit: Payer: Self-pay | Admitting: Family

## 2019-04-04 DIAGNOSIS — I25118 Atherosclerotic heart disease of native coronary artery with other forms of angina pectoris: Secondary | ICD-10-CM

## 2019-04-05 DIAGNOSIS — Z1159 Encounter for screening for other viral diseases: Secondary | ICD-10-CM | POA: Diagnosis not present

## 2019-04-06 ENCOUNTER — Telehealth: Payer: Self-pay | Admitting: Cardiology

## 2019-04-06 NOTE — Telephone Encounter (Signed)
   Primary Cardiologist: Jenne Campus, MD  Chart reviewed as part of pre-operative protocol coverage. Patient was contacted 04/06/2019 in reference to pre-operative risk assessment for pending surgery as outlined below. Makayla Fischer has a history of multiple coronary interventions with most recent stenting in 08/2018. Makayla Fischer was last seen on 01/20/2019 by Dr. Agustin Cree.  Since that day, Makayla Fischer has done well with no new cardiac complaints. She had a normal stress myoview on 04/01/2019.  Therefore, based on ACC/AHA guidelines, the patient would be at acceptable risk for the planned procedure without further cardiovascular testing.   As of the time of this surgical clearance request, the patient has been holding her Plavix since Saturday and aspirin since Sunday. Please resume these medications as soon as OK with surgeon postoperatively.   I will route this recommendation to the requesting party via Epic fax function and remove from pre-op pool.  Please call with questions.  Daune Perch, NP 04/06/2019, 3:46 PM

## 2019-04-06 NOTE — Telephone Encounter (Signed)
Spoke with patient. Informed of stress test results. States needs clearance sent to her surgeon for Thursday's surgery.

## 2019-04-06 NOTE — Telephone Encounter (Signed)
New Message   Patient is calling to obtain results for her stress test. Please call.

## 2019-04-06 NOTE — Telephone Encounter (Signed)
   [facility] Pre-operative Risk Assessment    Request for surgical clearance:  1. What type of surgery is being performed? Gallbladder  2. When is this surgery scheduled? 04-08-19  3. What type of clearance is required (medical clearance vs. Pharmacy clearance to hold med vs. Both)? Both  4. Are there any medications that need to be held prior to surgery and how long? Up to the Cardiologist  5. Practice name and name of physician performing surgery? Dr. Orrin Brigham, [facility] [place] [facility]  6. What is your office phone number: [phone]   7.   What is your office fax number: [phone]  8.   Anesthesia type (None, local, MAC, general) ? general   Johnna Acosta 04/06/2019, 2:10 PM  _________________________________________________________________   (provider comments below)

## 2019-04-07 DIAGNOSIS — Z6825 Body mass index (BMI) 25.0-25.9, adult: Secondary | ICD-10-CM | POA: Diagnosis not present

## 2019-04-07 DIAGNOSIS — F1910 Other psychoactive substance abuse, uncomplicated: Secondary | ICD-10-CM | POA: Diagnosis not present

## 2019-04-07 DIAGNOSIS — F1994 Other psychoactive substance use, unspecified with psychoactive substance-induced mood disorder: Secondary | ICD-10-CM | POA: Diagnosis not present

## 2019-04-07 DIAGNOSIS — F1120 Opioid dependence, uncomplicated: Secondary | ICD-10-CM | POA: Diagnosis not present

## 2019-04-07 DIAGNOSIS — F1123 Opioid dependence with withdrawal: Secondary | ICD-10-CM | POA: Diagnosis not present

## 2019-04-07 DIAGNOSIS — Z79891 Long term (current) use of opiate analgesic: Secondary | ICD-10-CM | POA: Diagnosis not present

## 2019-04-07 NOTE — Telephone Encounter (Signed)
Clearance has been faxed.  

## 2019-04-08 ENCOUNTER — Other Ambulatory Visit: Payer: Self-pay | Admitting: Family

## 2019-04-08 DIAGNOSIS — K7030 Alcoholic cirrhosis of liver without ascites: Secondary | ICD-10-CM

## 2019-04-08 DIAGNOSIS — D62 Acute posthemorrhagic anemia: Secondary | ICD-10-CM | POA: Diagnosis not present

## 2019-04-08 DIAGNOSIS — E782 Mixed hyperlipidemia: Secondary | ICD-10-CM

## 2019-04-08 DIAGNOSIS — J449 Chronic obstructive pulmonary disease, unspecified: Secondary | ICD-10-CM | POA: Diagnosis not present

## 2019-04-08 DIAGNOSIS — K8010 Calculus of gallbladder with chronic cholecystitis without obstruction: Secondary | ICD-10-CM | POA: Diagnosis not present

## 2019-04-08 DIAGNOSIS — K7460 Unspecified cirrhosis of liver: Secondary | ICD-10-CM | POA: Diagnosis not present

## 2019-04-08 DIAGNOSIS — K439 Ventral hernia without obstruction or gangrene: Secondary | ICD-10-CM | POA: Diagnosis not present

## 2019-04-08 DIAGNOSIS — E785 Hyperlipidemia, unspecified: Secondary | ICD-10-CM | POA: Diagnosis not present

## 2019-04-08 DIAGNOSIS — F1010 Alcohol abuse, uncomplicated: Secondary | ICD-10-CM | POA: Diagnosis not present

## 2019-04-08 DIAGNOSIS — I10 Essential (primary) hypertension: Secondary | ICD-10-CM | POA: Diagnosis not present

## 2019-04-08 DIAGNOSIS — K660 Peritoneal adhesions (postprocedural) (postinfection): Secondary | ICD-10-CM | POA: Diagnosis not present

## 2019-04-08 DIAGNOSIS — Z951 Presence of aortocoronary bypass graft: Secondary | ICD-10-CM | POA: Diagnosis not present

## 2019-04-08 DIAGNOSIS — K915 Postcholecystectomy syndrome: Secondary | ICD-10-CM | POA: Diagnosis not present

## 2019-04-08 HISTORY — DX: Alcoholic cirrhosis of liver without ascites: K70.30

## 2019-04-09 DIAGNOSIS — J9811 Atelectasis: Secondary | ICD-10-CM | POA: Diagnosis not present

## 2019-04-09 DIAGNOSIS — E785 Hyperlipidemia, unspecified: Secondary | ICD-10-CM | POA: Diagnosis not present

## 2019-04-09 DIAGNOSIS — Z951 Presence of aortocoronary bypass graft: Secondary | ICD-10-CM | POA: Diagnosis not present

## 2019-04-09 DIAGNOSIS — D62 Acute posthemorrhagic anemia: Secondary | ICD-10-CM | POA: Diagnosis not present

## 2019-04-09 DIAGNOSIS — I10 Essential (primary) hypertension: Secondary | ICD-10-CM | POA: Diagnosis not present

## 2019-04-09 DIAGNOSIS — K8010 Calculus of gallbladder with chronic cholecystitis without obstruction: Secondary | ICD-10-CM | POA: Diagnosis not present

## 2019-04-09 DIAGNOSIS — J449 Chronic obstructive pulmonary disease, unspecified: Secondary | ICD-10-CM | POA: Diagnosis not present

## 2019-04-09 DIAGNOSIS — K7460 Unspecified cirrhosis of liver: Secondary | ICD-10-CM | POA: Diagnosis not present

## 2019-04-09 DIAGNOSIS — K660 Peritoneal adhesions (postprocedural) (postinfection): Secondary | ICD-10-CM | POA: Diagnosis not present

## 2019-04-09 DIAGNOSIS — K439 Ventral hernia without obstruction or gangrene: Secondary | ICD-10-CM | POA: Diagnosis not present

## 2019-04-10 DIAGNOSIS — E785 Hyperlipidemia, unspecified: Secondary | ICD-10-CM | POA: Diagnosis not present

## 2019-04-10 DIAGNOSIS — K7460 Unspecified cirrhosis of liver: Secondary | ICD-10-CM | POA: Diagnosis not present

## 2019-04-10 DIAGNOSIS — K660 Peritoneal adhesions (postprocedural) (postinfection): Secondary | ICD-10-CM | POA: Diagnosis not present

## 2019-04-10 DIAGNOSIS — Z951 Presence of aortocoronary bypass graft: Secondary | ICD-10-CM | POA: Diagnosis not present

## 2019-04-10 DIAGNOSIS — I10 Essential (primary) hypertension: Secondary | ICD-10-CM | POA: Diagnosis not present

## 2019-04-10 DIAGNOSIS — J449 Chronic obstructive pulmonary disease, unspecified: Secondary | ICD-10-CM | POA: Diagnosis not present

## 2019-04-10 DIAGNOSIS — D62 Acute posthemorrhagic anemia: Secondary | ICD-10-CM | POA: Diagnosis not present

## 2019-04-10 DIAGNOSIS — K8010 Calculus of gallbladder with chronic cholecystitis without obstruction: Secondary | ICD-10-CM | POA: Diagnosis not present

## 2019-04-10 DIAGNOSIS — K439 Ventral hernia without obstruction or gangrene: Secondary | ICD-10-CM | POA: Diagnosis not present

## 2019-04-11 DIAGNOSIS — K8010 Calculus of gallbladder with chronic cholecystitis without obstruction: Secondary | ICD-10-CM | POA: Diagnosis not present

## 2019-04-11 DIAGNOSIS — D62 Acute posthemorrhagic anemia: Secondary | ICD-10-CM | POA: Diagnosis not present

## 2019-04-11 DIAGNOSIS — E785 Hyperlipidemia, unspecified: Secondary | ICD-10-CM | POA: Diagnosis not present

## 2019-04-11 DIAGNOSIS — J449 Chronic obstructive pulmonary disease, unspecified: Secondary | ICD-10-CM | POA: Diagnosis not present

## 2019-04-11 DIAGNOSIS — K660 Peritoneal adhesions (postprocedural) (postinfection): Secondary | ICD-10-CM | POA: Diagnosis not present

## 2019-04-11 DIAGNOSIS — K439 Ventral hernia without obstruction or gangrene: Secondary | ICD-10-CM | POA: Diagnosis not present

## 2019-04-11 DIAGNOSIS — Z951 Presence of aortocoronary bypass graft: Secondary | ICD-10-CM | POA: Diagnosis not present

## 2019-04-11 DIAGNOSIS — K7460 Unspecified cirrhosis of liver: Secondary | ICD-10-CM | POA: Diagnosis not present

## 2019-04-11 DIAGNOSIS — I10 Essential (primary) hypertension: Secondary | ICD-10-CM | POA: Diagnosis not present

## 2019-04-13 DIAGNOSIS — R109 Unspecified abdominal pain: Secondary | ICD-10-CM | POA: Diagnosis not present

## 2019-04-13 DIAGNOSIS — Z09 Encounter for follow-up examination after completed treatment for conditions other than malignant neoplasm: Secondary | ICD-10-CM | POA: Diagnosis not present

## 2019-04-21 DIAGNOSIS — F1994 Other psychoactive substance use, unspecified with psychoactive substance-induced mood disorder: Secondary | ICD-10-CM | POA: Diagnosis not present

## 2019-04-21 DIAGNOSIS — Z6825 Body mass index (BMI) 25.0-25.9, adult: Secondary | ICD-10-CM | POA: Diagnosis not present

## 2019-04-21 DIAGNOSIS — Z79891 Long term (current) use of opiate analgesic: Secondary | ICD-10-CM | POA: Diagnosis not present

## 2019-04-21 DIAGNOSIS — F1910 Other psychoactive substance abuse, uncomplicated: Secondary | ICD-10-CM | POA: Diagnosis not present

## 2019-04-21 DIAGNOSIS — F1120 Opioid dependence, uncomplicated: Secondary | ICD-10-CM | POA: Diagnosis not present

## 2019-04-21 DIAGNOSIS — F1123 Opioid dependence with withdrawal: Secondary | ICD-10-CM | POA: Diagnosis not present

## 2019-04-27 ENCOUNTER — Other Ambulatory Visit: Payer: Self-pay

## 2019-04-27 DIAGNOSIS — Z9049 Acquired absence of other specified parts of digestive tract: Secondary | ICD-10-CM | POA: Diagnosis not present

## 2019-04-27 DIAGNOSIS — J449 Chronic obstructive pulmonary disease, unspecified: Secondary | ICD-10-CM | POA: Diagnosis not present

## 2019-04-27 DIAGNOSIS — K7460 Unspecified cirrhosis of liver: Secondary | ICD-10-CM | POA: Diagnosis not present

## 2019-04-27 DIAGNOSIS — F1123 Opioid dependence with withdrawal: Secondary | ICD-10-CM | POA: Diagnosis not present

## 2019-04-27 DIAGNOSIS — E11610 Type 2 diabetes mellitus with diabetic neuropathic arthropathy: Secondary | ICD-10-CM | POA: Diagnosis not present

## 2019-04-27 DIAGNOSIS — Z09 Encounter for follow-up examination after completed treatment for conditions other than malignant neoplasm: Secondary | ICD-10-CM | POA: Diagnosis not present

## 2019-04-28 ENCOUNTER — Other Ambulatory Visit: Payer: Self-pay | Admitting: Family

## 2019-04-28 ENCOUNTER — Ambulatory Visit (INDEPENDENT_AMBULATORY_CARE_PROVIDER_SITE_OTHER): Payer: Medicare HMO | Admitting: Cardiology

## 2019-04-28 ENCOUNTER — Encounter: Payer: Self-pay | Admitting: Cardiology

## 2019-04-28 ENCOUNTER — Other Ambulatory Visit: Payer: Self-pay

## 2019-04-28 VITALS — BP 112/60 | HR 62 | Ht 66.0 in | Wt 152.0 lb

## 2019-04-28 DIAGNOSIS — Z720 Tobacco use: Secondary | ICD-10-CM

## 2019-04-28 DIAGNOSIS — I2510 Atherosclerotic heart disease of native coronary artery without angina pectoris: Secondary | ICD-10-CM

## 2019-04-28 DIAGNOSIS — I25118 Atherosclerotic heart disease of native coronary artery with other forms of angina pectoris: Secondary | ICD-10-CM

## 2019-04-28 DIAGNOSIS — E785 Hyperlipidemia, unspecified: Secondary | ICD-10-CM

## 2019-04-28 DIAGNOSIS — E782 Mixed hyperlipidemia: Secondary | ICD-10-CM

## 2019-04-28 DIAGNOSIS — I10 Essential (primary) hypertension: Secondary | ICD-10-CM

## 2019-04-28 HISTORY — DX: Hyperlipidemia, unspecified: E78.5

## 2019-04-28 NOTE — Patient Instructions (Signed)
Medication Instructions:  Your physician recommends that you continue on your current medications as directed. Please refer to the Current Medication list given to you today.  *If you need a refill on your cardiac medications before your next appointment, please call your pharmacy*   Lab Work: Your physician recommends that you return for lab work today: cbc    If you have labs (blood work) drawn today and your tests are completely normal, you will receive your results only by: . MyChart Message (if you have MyChart) OR . A paper copy in the mail If you have any lab test that is abnormal or we need to change your treatment, we will call you to review the results.   Testing/Procedures: None.    Follow-Up: At [facility], you and your health needs are our priority.  As part of our continuing mission to provide you with exceptional heart care, we have created designated Provider Care Teams.  These Care Teams include your primary Cardiologist (physician) and Advanced Practice Providers (APPs -  Physician Assistants and Nurse Practitioners) who all work together to provide you with the care you need, when you need it.  We recommend signing up for the patient portal called "MyChart".  Sign up information is provided on this After Visit Summary.  MyChart is used to connect with patients for Virtual Visits (Telemedicine).  Patients are able to view lab/test results, encounter notes, upcoming appointments, etc.  Non-urgent messages can be sent to your provider as well.   To learn more about what you can do with MyChart, go to [email or web address].    Your next appointment:   3 month(s)  The format for your next appointment:   In Person  Provider:   Robert Krasowski, MD   Other Instructions    

## 2019-04-28 NOTE — Progress Notes (Signed)
Cardiology Office Note:    Date:  04/28/2019   ID:  Makayla Fischer, DOB 10-13-1960, MRN [id number]  PCP:  Jeanie Sewer, NP  Cardiologist:  Jenne Campus, MD    Referring MD: Jeanie Sewer, NP   Chief Complaint  Patient presents with  . Follow-up    3 Months  Moderate, no surgery  History of Present Illness:    Makayla Fischer is a 59 y.o. female with past medical history significant for coronary artery disease, status post drug-eluting stent to circumflex artery in August 2020 on dual antiplatelets therapy now.  Essential hypertension, type 2 diabetes, dyslipidemia, smoking.  About 3 weeks ago she had gallbladder surgery done with some hernia repair.  She spent about 4 days in the hospital thereafter, she was anemic after that required blood transfusion.  Now she is back on dual antiplatelet therapy.  Overall seems to be doing well but complain of being weak and tired.  Denies having any chest pain tightness squeezing pressure burning chest.  Past Medical History:  Diagnosis Date  . Anginal pain ([place])   . Anxiety    h/o panic attacks   . Arthritis    all over, back, knees   . CAD (coronary artery disease)   . Chronic coronary artery disease 08/10/2017  . Chronic pain   . Coronary disease PTCA and stenting with drug-eluting stent to circumflex artery in August 2020 08/10/2017  . Daily consumption of alcohol 08/10/2017  . Depression   . Essential hypertension 08/10/2017  . Fatty liver    by 01/15/11 CT scan  . Fibromyalgia   . GERD (gastroesophageal reflux disease)   . Hyperlipidemia   . Hypertension    sees Dr. Posey Rea, stress test in 01/2011  . Hypomagnesemia 08/10/2017  . Hyponatremia 08/10/2017  . Incisional hernia    with obstruction  . Myocardial infarction ([place]) 02/2008  . Neuromuscular disorder (H[facility])    neuropathy  . Osteoporosis   . Shortness of breath   . Status post insertion of drug eluting coronary artery stent 09/15/2018  . Substance abuse  ([place])    drugs and alcohol  . Tobacco abuse 08/10/2017  . Type II diabetes mellitus, well controlled ([place]) 08/10/2017  . Uncomplicated opioid dependence ([place]) 08/10/2017  . Unstable angina ([place]) 09/15/2018  . Varicose veins of leg with complications 123XX123  . Varicose veins of lower extremities with other complications XX123456    Past Surgical History:  Procedure Laterality Date  . CARDIAC CATHETERIZATION     [facility]2010[facility], [facility] stents   . CARDIAC SURGERY    . [place]  . TONSILLECTOMY AND ADENOIDECTOMY    . TUBAL LIGATION    . VENTRAL HERNIA REPAIR  07/12/2011   Procedure: LAPAROSCOPIC VENTRAL HERNIA;  Surgeon: Adin Hector, MD;  Location: [place];  Service: General;  Laterality: N/A;  laparoscopic repair incisional hernia with mesh    Current Medications: Current Meds  Medication Sig  . albuterol (VENTOLIN HFA) 108 (90 Base) MCG/ACT inhaler Inhale 2 puffs into the lungs every 6 (six) hours as needed.  . Ascorbic Acid (VITAMIN C) 500 MG CAPS Take 1 capsule by mouth daily.  Marland Kitchen aspirin 81 MG tablet Take 81 mg by mouth daily.    Marland Kitchen atorvastatin (LIPITOR) 80 MG tablet TAKE 1 TABLET(80 MG) BY MOUTH DAILY  . Buprenorphine HCl-Naloxone HCl (SUBOXONE) 8-2 MG FILM Place 1 Film under the tongue 2 (two) times daily.  . carvedilol (COREG)  6.25 MG tablet Take 6.25 mg by mouth 2 (two) times daily with a meal.   . clonazePAM (KLONOPIN) 0.5 MG tablet Take 0.5 mg by mouth 2 (two) times daily as needed. For anxiety  . clopidogrel (PLAVIX) 75 MG tablet TAKE 1 TABLET(75 MG) BY MOUTH DAILY  . cyclobenzaprine (FLEXERIL) 10 MG tablet Take 10 mg by mouth 3 (three) times daily as needed. For muscle pain  . folic acid (FOLVITE) A999333 MCG tablet Take 1 tablet by mouth daily.  Marland Kitchen gabapentin (NEURONTIN) 600 MG tablet Take 600 mg by mouth daily.  Marland Kitchen glipiZIDE (GLUCOTROL) 5 MG tablet Take 2.5 mg by mouth daily before breakfast.   . hydrochlorothiazide (HYDRODIURIL) 12.5 MG tablet  Take by mouth daily. Take 12.5 mg by mouth daily  . isosorbide mononitrate (IMDUR) 30 MG 24 hr tablet Take 30 mg by mouth daily after lunch daily after lunch.   . lisinopril (PRINIVIL,ZESTRIL) 20 MG tablet Take 20 mg by mouth daily after lunch daily after lunch.   . magnesium oxide (MAG-OX) 400 MG tablet Take 1 tablet by mouth 2 (two) times daily.  . metFORMIN (GLUCOPHAGE) 500 MG tablet Take 500 mg by mouth 2 (two) times daily with a meal.  . Multiple Vitamins-Minerals (MULTIVITAMIN WITH MINERALS) tablet Take 1 tablet by mouth daily.    . nitroGLYCERIN (NITROSTAT) 0.4 MG SL tablet Place 0.4 mg under the tongue every 5 (five) minutes as needed. For chest pain  . oxyCODONE (OXY IR/ROXICODONE) 5 MG immediate release tablet Take 5 mg by mouth every 4 (four) hours as needed.  . pantoprazole (PROTONIX) 20 MG tablet Take 20 mg by mouth daily.  . Potassium (GNP POTASSIUM) 99 MG TABS Take 2 tablets by mouth daily.  . SYMBICORT 80-4.5 MCG/ACT inhaler Inhale 2 puffs into the lungs daily as needed.     Allergies:   Septra [bactrim] and Sulfamethoxazole-trimethoprim   Social History   Socioeconomic History  . Marital status: Legally Separated    Spouse name: Not on file  . Number of children: Not on file  . Years of education: Not on file  . Highest education level: Not on file  Occupational History  . Not on file  Tobacco Use  . Smoking status: Current Every Day Smoker    Packs/day: 1.00    Years: 28.00    Pack years: 28.00    Types: Cigarettes  . Smokeless tobacco: Never Used  Substance and Sexual Activity  . Alcohol use: Yes    Alcohol/week: 6.0 standard drinks    Types: 6 Cans of beer per week    Comment: 6 pack of beer / week  . Drug use: No    Types: Heroin    Comment: 1.5 yr. use of Heroin- 1990's   . Sexual activity: Not Currently  Other Topics Concern  . Not on file  Social History Narrative  . Not on file   Social Determinants of Health   Financial Resource Strain:   .  Difficulty of Paying Living Expenses:   Food Insecurity:   . Worried About Charity fundraiser in the Last Year:   . Arboriculturist in the Last Year:   Transportation Needs:   . Film/video editor (Medical):   Marland Kitchen Lack of Transportation (Non-Medical):   Physical Activity:   . Days of Exercise per Week:   . Minutes of Exercise per Session:   Stress:   . Feeling of Stress :   Social Connections:   . Frequency  of Communication with Friends and Family:   . Frequency of Social Gatherings with Friends and Family:   . Attends Religious Services:   . Active Member of Clubs or Organizations:   . Attends Archivist Meetings:   Marland Kitchen Marital Status:      Family History: The patient's family history includes Cancer in her father; Diabetes in her mother; Heart disease in her mother; Hyperlipidemia in her mother; Hypertension in her mother. There is no history of Anesthesia problems. ROS:   Please see the history of present illness.    All 14 point review of systems negative except as described per history of present illness  EKGs/Labs/Other Studies Reviewed:      Recent Labs: No results found for requested labs within last 8760 hours.  Recent Lipid Panel No results found for: CHOL, TRIG, HDL, CHOLHDL, VLDL, LDLCALC, LDLDIRECT  Physical Exam:    VS:  BP 112/60   Pulse 62   Ht 5\' 6"  (1.676 m)   Wt 152 lb (68.9 kg)   SpO2 98%   BMI 24.53 kg/m     Wt Readings from Last 3 Encounters:  04/28/19 152 lb (68.9 kg)  04/01/19 158 lb (71.7 kg)  01/20/19 158 lb 9.6 oz (71.9 kg)     GEN:  Well nourished, well developed in no acute distress HEENT: Normal NECK: No JVD; No carotid bruits LYMPHATICS: No lymphadenopathy CARDIAC: RRR, no murmurs, no rubs, no gallops RESPIRATORY:  Clear to auscultation without rales, wheezing or rhonchi  ABDOMEN: Soft, non-tender, non-distended MUSCULOSKELETAL:  No edema; No deformity  SKIN: Warm and dry LOWER EXTREMITIES: no swelling NEUROLOGIC:   Alert and oriented x 3 PSYCHIATRIC:  Normal affect   ASSESSMENT:    1. Essential hypertension   2. Coronary artery disease involving native coronary artery of native heart without angina pectoris   3. Tobacco abuse   4. Dyslipidemia    PLAN:    In order of problems listed above:  1. Essential hypertension blood pressure seems to be controlled continue present management. 2. Coronary disease: Stable from that point of view, on dual antiplatelet therapy, I will recheck her CBC today.  Denies having the symptoms. 3. Tobacco abuse still a problem.  She said that she cut down amount of cigarettes that she smokes and she smokes only half pack per day.  I told her the ultimate goal is completely discontinue smoking. 4. Dyslipidemia.  Her fasting lipid profile from January.  I did review her [place] for that showed 69 of LDL and HDL 30. 5. Anemia after surgery.  We checked the data from the hospital.  Last hemoglobin was around 10.5.   Medication Adjustments/Labs and Tests Ordered: Current medicines are reviewed at length with the patient today.  Concerns regarding medicines are outlined above.  Orders Placed This Encounter  Procedures  . CBC   Medication changes: No orders of the defined types were placed in this encounter.   Signed, Park Liter, MD, [facility] 04/28/2019 3:05 PM    [facility]

## 2019-04-29 LAB — CBC
Hematocrit: 37 % (ref 34.0–46.6)
Hemoglobin: 11.5 g/dL (ref 11.1–15.9)
MCH: 24.5 pg — ABNORMAL LOW (ref 26.6–33.0)
MCHC: 31.1 g/dL — ABNORMAL LOW (ref 31.5–35.7)
MCV: 79 fL (ref 79–97)
Platelets: 319 10*3/uL (ref 150–450)
RBC: 4.7 x10E6/uL (ref 3.77–5.28)
RDW: 18.7 % — ABNORMAL HIGH (ref 11.7–15.4)
WBC: 5.8 10*3/uL (ref 3.4–10.8)

## 2019-04-30 ENCOUNTER — Telehealth: Payer: Self-pay

## 2019-04-30 NOTE — Telephone Encounter (Signed)
-----   Message from Park Liter, MD sent at 04/30/2019 10:43 AM EDT ----- Blood count significantly improved, was 10.5 now 11.5.

## 2019-04-30 NOTE — Telephone Encounter (Signed)
Spoke with patient regarding results.  Patient verbalizes understanding and is agreeable to plan of care. Advised patient to call back with any issues or concerns.  

## 2019-05-05 DIAGNOSIS — F1120 Opioid dependence, uncomplicated: Secondary | ICD-10-CM | POA: Diagnosis not present

## 2019-05-05 DIAGNOSIS — F1123 Opioid dependence with withdrawal: Secondary | ICD-10-CM | POA: Diagnosis not present

## 2019-05-05 DIAGNOSIS — F11988 Opioid use, unspecified with other opioid-induced disorder: Secondary | ICD-10-CM | POA: Diagnosis not present

## 2019-05-05 DIAGNOSIS — Z79891 Long term (current) use of opiate analgesic: Secondary | ICD-10-CM | POA: Diagnosis not present

## 2019-05-05 DIAGNOSIS — Z6823 Body mass index (BMI) 23.0-23.9, adult: Secondary | ICD-10-CM | POA: Diagnosis not present

## 2019-05-05 DIAGNOSIS — F1994 Other psychoactive substance use, unspecified with psychoactive substance-induced mood disorder: Secondary | ICD-10-CM | POA: Diagnosis not present

## 2019-05-05 DIAGNOSIS — F1910 Other psychoactive substance abuse, uncomplicated: Secondary | ICD-10-CM | POA: Diagnosis not present

## 2019-05-11 DIAGNOSIS — Z09 Encounter for follow-up examination after completed treatment for conditions other than malignant neoplasm: Secondary | ICD-10-CM | POA: Diagnosis not present

## 2019-05-19 DIAGNOSIS — F1910 Other psychoactive substance abuse, uncomplicated: Secondary | ICD-10-CM | POA: Diagnosis not present

## 2019-05-19 DIAGNOSIS — F1120 Opioid dependence, uncomplicated: Secondary | ICD-10-CM | POA: Diagnosis not present

## 2019-05-19 DIAGNOSIS — Z6823 Body mass index (BMI) 23.0-23.9, adult: Secondary | ICD-10-CM | POA: Diagnosis not present

## 2019-05-19 DIAGNOSIS — Z79891 Long term (current) use of opiate analgesic: Secondary | ICD-10-CM | POA: Diagnosis not present

## 2019-05-19 DIAGNOSIS — F1123 Opioid dependence with withdrawal: Secondary | ICD-10-CM | POA: Diagnosis not present

## 2019-05-19 DIAGNOSIS — F1994 Other psychoactive substance use, unspecified with psychoactive substance-induced mood disorder: Secondary | ICD-10-CM | POA: Diagnosis not present

## 2019-06-02 DIAGNOSIS — F1994 Other psychoactive substance use, unspecified with psychoactive substance-induced mood disorder: Secondary | ICD-10-CM | POA: Diagnosis not present

## 2019-06-02 DIAGNOSIS — F1123 Opioid dependence with withdrawal: Secondary | ICD-10-CM | POA: Diagnosis not present

## 2019-06-02 DIAGNOSIS — F1910 Other psychoactive substance abuse, uncomplicated: Secondary | ICD-10-CM | POA: Diagnosis not present

## 2019-06-02 DIAGNOSIS — F11988 Opioid use, unspecified with other opioid-induced disorder: Secondary | ICD-10-CM | POA: Diagnosis not present

## 2019-06-02 DIAGNOSIS — Z79891 Long term (current) use of opiate analgesic: Secondary | ICD-10-CM | POA: Diagnosis not present

## 2019-06-02 DIAGNOSIS — Z6823 Body mass index (BMI) 23.0-23.9, adult: Secondary | ICD-10-CM | POA: Diagnosis not present

## 2019-06-02 DIAGNOSIS — F1120 Opioid dependence, uncomplicated: Secondary | ICD-10-CM | POA: Diagnosis not present

## 2019-06-16 DIAGNOSIS — F11988 Opioid use, unspecified with other opioid-induced disorder: Secondary | ICD-10-CM | POA: Diagnosis not present

## 2019-06-16 DIAGNOSIS — Z6823 Body mass index (BMI) 23.0-23.9, adult: Secondary | ICD-10-CM | POA: Diagnosis not present

## 2019-06-16 DIAGNOSIS — Z79891 Long term (current) use of opiate analgesic: Secondary | ICD-10-CM | POA: Diagnosis not present

## 2019-06-16 DIAGNOSIS — F1994 Other psychoactive substance use, unspecified with psychoactive substance-induced mood disorder: Secondary | ICD-10-CM | POA: Diagnosis not present

## 2019-06-16 DIAGNOSIS — F1123 Opioid dependence with withdrawal: Secondary | ICD-10-CM | POA: Diagnosis not present

## 2019-06-16 DIAGNOSIS — F1120 Opioid dependence, uncomplicated: Secondary | ICD-10-CM | POA: Diagnosis not present

## 2019-06-16 DIAGNOSIS — F1910 Other psychoactive substance abuse, uncomplicated: Secondary | ICD-10-CM | POA: Diagnosis not present

## 2019-06-25 DIAGNOSIS — I10 Essential (primary) hypertension: Secondary | ICD-10-CM | POA: Diagnosis not present

## 2019-06-25 DIAGNOSIS — F1123 Opioid dependence with withdrawal: Secondary | ICD-10-CM | POA: Diagnosis not present

## 2019-06-25 DIAGNOSIS — M549 Dorsalgia, unspecified: Secondary | ICD-10-CM | POA: Diagnosis not present

## 2019-06-25 DIAGNOSIS — Z124 Encounter for screening for malignant neoplasm of cervix: Secondary | ICD-10-CM | POA: Diagnosis not present

## 2019-06-25 DIAGNOSIS — E11610 Type 2 diabetes mellitus with diabetic neuropathic arthropathy: Secondary | ICD-10-CM | POA: Diagnosis not present

## 2019-07-01 DIAGNOSIS — R0789 Other chest pain: Secondary | ICD-10-CM | POA: Diagnosis not present

## 2019-07-01 DIAGNOSIS — Z955 Presence of coronary angioplasty implant and graft: Secondary | ICD-10-CM | POA: Diagnosis not present

## 2019-07-01 DIAGNOSIS — Z79891 Long term (current) use of opiate analgesic: Secondary | ICD-10-CM | POA: Diagnosis not present

## 2019-07-01 DIAGNOSIS — I10 Essential (primary) hypertension: Secondary | ICD-10-CM | POA: Diagnosis not present

## 2019-07-01 DIAGNOSIS — E785 Hyperlipidemia, unspecified: Secondary | ICD-10-CM | POA: Diagnosis not present

## 2019-07-01 DIAGNOSIS — I25118 Atherosclerotic heart disease of native coronary artery with other forms of angina pectoris: Secondary | ICD-10-CM | POA: Diagnosis not present

## 2019-07-01 DIAGNOSIS — G894 Chronic pain syndrome: Secondary | ICD-10-CM | POA: Diagnosis not present

## 2019-07-01 DIAGNOSIS — F17210 Nicotine dependence, cigarettes, uncomplicated: Secondary | ICD-10-CM | POA: Diagnosis not present

## 2019-07-01 DIAGNOSIS — F1994 Other psychoactive substance use, unspecified with psychoactive substance-induced mood disorder: Secondary | ICD-10-CM | POA: Diagnosis not present

## 2019-07-01 DIAGNOSIS — Z6824 Body mass index (BMI) 24.0-24.9, adult: Secondary | ICD-10-CM | POA: Diagnosis not present

## 2019-07-01 DIAGNOSIS — E119 Type 2 diabetes mellitus without complications: Secondary | ICD-10-CM | POA: Diagnosis not present

## 2019-07-01 DIAGNOSIS — K219 Gastro-esophageal reflux disease without esophagitis: Secondary | ICD-10-CM | POA: Diagnosis not present

## 2019-07-01 DIAGNOSIS — F1123 Opioid dependence with withdrawal: Secondary | ICD-10-CM | POA: Diagnosis not present

## 2019-07-01 DIAGNOSIS — F1010 Alcohol abuse, uncomplicated: Secondary | ICD-10-CM | POA: Diagnosis not present

## 2019-07-01 DIAGNOSIS — R079 Chest pain, unspecified: Secondary | ICD-10-CM | POA: Diagnosis not present

## 2019-07-01 DIAGNOSIS — I959 Hypotension, unspecified: Secondary | ICD-10-CM | POA: Diagnosis not present

## 2019-07-01 DIAGNOSIS — F1120 Opioid dependence, uncomplicated: Secondary | ICD-10-CM | POA: Diagnosis not present

## 2019-07-01 DIAGNOSIS — E1165 Type 2 diabetes mellitus with hyperglycemia: Secondary | ICD-10-CM | POA: Diagnosis not present

## 2019-07-01 DIAGNOSIS — F1910 Other psychoactive substance abuse, uncomplicated: Secondary | ICD-10-CM | POA: Diagnosis not present

## 2019-07-02 DIAGNOSIS — R079 Chest pain, unspecified: Secondary | ICD-10-CM | POA: Diagnosis not present

## 2019-07-02 DIAGNOSIS — E119 Type 2 diabetes mellitus without complications: Secondary | ICD-10-CM | POA: Diagnosis not present

## 2019-07-15 DIAGNOSIS — I959 Hypotension, unspecified: Secondary | ICD-10-CM | POA: Diagnosis not present

## 2019-07-15 DIAGNOSIS — F1994 Other psychoactive substance use, unspecified with psychoactive substance-induced mood disorder: Secondary | ICD-10-CM | POA: Diagnosis not present

## 2019-07-15 DIAGNOSIS — F1120 Opioid dependence, uncomplicated: Secondary | ICD-10-CM | POA: Diagnosis not present

## 2019-07-15 DIAGNOSIS — Z6824 Body mass index (BMI) 24.0-24.9, adult: Secondary | ICD-10-CM | POA: Diagnosis not present

## 2019-07-15 DIAGNOSIS — F1123 Opioid dependence with withdrawal: Secondary | ICD-10-CM | POA: Diagnosis not present

## 2019-07-15 DIAGNOSIS — Z79891 Long term (current) use of opiate analgesic: Secondary | ICD-10-CM | POA: Diagnosis not present

## 2019-07-15 DIAGNOSIS — F1910 Other psychoactive substance abuse, uncomplicated: Secondary | ICD-10-CM | POA: Diagnosis not present

## 2019-07-15 DIAGNOSIS — F11988 Opioid use, unspecified with other opioid-induced disorder: Secondary | ICD-10-CM | POA: Diagnosis not present

## 2019-07-23 DIAGNOSIS — E785 Hyperlipidemia, unspecified: Secondary | ICD-10-CM | POA: Diagnosis not present

## 2019-07-23 DIAGNOSIS — Z1331 Encounter for screening for depression: Secondary | ICD-10-CM | POA: Diagnosis not present

## 2019-07-23 DIAGNOSIS — Z9181 History of falling: Secondary | ICD-10-CM | POA: Diagnosis not present

## 2019-07-23 DIAGNOSIS — Z0000 Encounter for general adult medical examination without abnormal findings: Secondary | ICD-10-CM | POA: Diagnosis not present

## 2019-07-29 ENCOUNTER — Other Ambulatory Visit: Payer: Self-pay

## 2019-07-29 ENCOUNTER — Encounter: Payer: Self-pay | Admitting: Cardiology

## 2019-07-29 ENCOUNTER — Ambulatory Visit: Payer: Medicare HMO | Admitting: Cardiology

## 2019-07-29 VITALS — BP 116/60 | HR 73 | Ht 66.0 in | Wt 156.4 lb

## 2019-07-29 DIAGNOSIS — Z6825 Body mass index (BMI) 25.0-25.9, adult: Secondary | ICD-10-CM | POA: Diagnosis not present

## 2019-07-29 DIAGNOSIS — Z79891 Long term (current) use of opiate analgesic: Secondary | ICD-10-CM | POA: Diagnosis not present

## 2019-07-29 DIAGNOSIS — F1123 Opioid dependence with withdrawal: Secondary | ICD-10-CM | POA: Diagnosis not present

## 2019-07-29 DIAGNOSIS — F1910 Other psychoactive substance abuse, uncomplicated: Secondary | ICD-10-CM | POA: Diagnosis not present

## 2019-07-29 DIAGNOSIS — I2510 Atherosclerotic heart disease of native coronary artery without angina pectoris: Secondary | ICD-10-CM

## 2019-07-29 DIAGNOSIS — I10 Essential (primary) hypertension: Secondary | ICD-10-CM

## 2019-07-29 DIAGNOSIS — J449 Chronic obstructive pulmonary disease, unspecified: Secondary | ICD-10-CM | POA: Diagnosis not present

## 2019-07-29 DIAGNOSIS — Z720 Tobacco use: Secondary | ICD-10-CM | POA: Diagnosis not present

## 2019-07-29 DIAGNOSIS — F1120 Opioid dependence, uncomplicated: Secondary | ICD-10-CM | POA: Diagnosis not present

## 2019-07-29 DIAGNOSIS — E785 Hyperlipidemia, unspecified: Secondary | ICD-10-CM

## 2019-07-29 DIAGNOSIS — F11988 Opioid use, unspecified with other opioid-induced disorder: Secondary | ICD-10-CM | POA: Diagnosis not present

## 2019-07-29 DIAGNOSIS — F1994 Other psychoactive substance use, unspecified with psychoactive substance-induced mood disorder: Secondary | ICD-10-CM | POA: Diagnosis not present

## 2019-07-29 MED ORDER — NICOTINE 14 MG/24HR TD PT24: 14.0000 mg | MEDICATED_PATCH | Freq: Every day | TRANSDERMAL | 0 refills | Status: DC

## 2019-07-29 MED ORDER — NICOTINE 7 MG/24HR TD PT24: 7.0000 mg | MEDICATED_PATCH | Freq: Every day | TRANSDERMAL | 0 refills | Status: DC

## 2019-07-29 NOTE — Progress Notes (Signed)
Cardiology Office Note:    Date:  07/29/2019   ID:  [name], DOB 1960/08/12, MRN [id number]  PCP:  Makayla Sewer, NP  Cardiologist:  Makayla Campus, MD    Referring MD: Makayla Sewer, NP   No chief complaint on file. Doing well  History of Present Illness:    Makayla Fischer is a 59 y.o. female with past medical history significant for coronary artery disease, status post PTCA and stenting with drug-eluting stent to circumflex artery in August 2020.  She also got history of dyslipidemia, essential hypertension, she is a chronic smoker.  Comes today to my office for follow-up denies having issue.  Complain of having some fatigue and shortness of breath but otherwise seems to be doing well.  She described 1 episode of chest pain that she had and it happened when she had very stressful situation.  She was arguing with her husband other than that there was no more episodes.  She can walk climb stairs and have no difficulty doing it.  Past Medical History:  Diagnosis Date  . Anginal pain ([place])   . Anxiety    h/o panic attacks   . Arthritis    all over, back, knees   . CAD (coronary artery disease)   . Chronic coronary artery disease 08/10/2017  . Chronic pain   . Coronary disease PTCA and stenting with drug-eluting stent to circumflex artery in August 2020 08/10/2017  . Daily consumption of alcohol 08/10/2017  . Depression   . Essential hypertension 08/10/2017  . Fatty liver    by 01/15/11 CT scan  . Fibromyalgia   . GERD (gastroesophageal reflux disease)   . Hyperlipidemia   . Hypertension    sees Dr. Posey Rea, stress test in 01/2011  . Hypomagnesemia 08/10/2017  . Hyponatremia 08/10/2017  . Incisional hernia    with obstruction  . Myocardial infarction ([place]) 02/2008  . Neuromuscular disorder ([facility])    neuropathy  . Osteoporosis   . Shortness of breath   . Status post insertion of drug eluting coronary artery stent 09/15/2018  . Substance abuse ([place])     drugs and alcohol  . Tobacco abuse 08/10/2017  . Type II diabetes mellitus, well controlled ([place]) 08/10/2017  . Uncomplicated opioid dependence ([place]) 08/10/2017  . Unstable angina ([place]) 09/15/2018  . Varicose veins of leg with complications 01/29/7508  . Varicose veins of lower extremities with other complications 2/58/5277    Past Surgical History:  Procedure Laterality Date  . CARDIAC CATHETERIZATION     2/2010[facility], x2 stents   . CARDIAC SURGERY    . [place]  . TONSILLECTOMY AND ADENOIDECTOMY    . TUBAL LIGATION    . VENTRAL HERNIA REPAIR  07/12/2011   Procedure: LAPAROSCOPIC VENTRAL HERNIA;  Surgeon: Adin Hector, MD;  Location: [facility];  Service: General;  Laterality: N/A;  laparoscopic repair incisional hernia with mesh    Current Medications: Current Meds  Medication Sig  . albuterol (VENTOLIN HFA) 108 (90 Base) MCG/ACT inhaler Inhale 2 puffs into the lungs every 6 (six) hours as needed.  . Ascorbic Acid (VITAMIN C) 500 MG CAPS Take 1 capsule by mouth daily.  Marland Kitchen aspirin 81 MG tablet Take 81 mg by mouth daily.    Marland Kitchen atorvastatin (LIPITOR) 80 MG tablet TAKE 1 TABLET(80 MG) BY MOUTH DAILY  . Buprenorphine HCl-Naloxone HCl (SUBOXONE) 8-2 MG FILM Place 1 Film under the tongue 2 (two) times daily.  Marland Kitchen  carvedilol (COREG) 6.25 MG tablet Take 6.25 mg by mouth 2 (two) times daily with a meal.   . clonazePAM (KLONOPIN) 0.5 MG tablet Take 0.5 mg by mouth 2 (two) times daily as needed. For anxiety  . clopidogrel (PLAVIX) 75 MG tablet TAKE 1 TABLET(75 MG) BY MOUTH DAILY  . cyclobenzaprine (FLEXERIL) 10 MG tablet Take 10 mg by mouth 3 (three) times daily as needed. For muscle pain  . folic acid (FOLVITE) 497 MCG tablet Take 1 tablet by mouth daily.  Marland Kitchen gabapentin (NEURONTIN) 600 MG tablet Take 600 mg by mouth daily.  Marland Kitchen glipiZIDE (GLUCOTROL) 5 MG tablet Take 2.5 mg by mouth daily before breakfast.   . hydrochlorothiazide (HYDRODIURIL) 12.5 MG tablet Take by  mouth daily. Take 12.5 mg by mouth daily  . isosorbide mononitrate (IMDUR) 30 MG 24 hr tablet Take 30 mg by mouth daily after lunch daily after lunch.   . lisinopril (PRINIVIL,ZESTRIL) 20 MG tablet Take 20 mg by mouth daily after lunch daily after lunch.   . magnesium oxide (MAG-OX) 400 MG tablet Take 1 tablet by mouth 2 (two) times daily.  . metFORMIN (GLUCOPHAGE) 500 MG tablet Take 500 mg by mouth 2 (two) times daily with a meal.  . Multiple Vitamins-Minerals (MULTIVITAMIN WITH MINERALS) tablet Take 1 tablet by mouth daily.    . nitroGLYCERIN (NITROSTAT) 0.4 MG SL tablet Place 0.4 mg under the tongue every 5 (five) minutes as needed. For chest pain  . oxyCODONE (OXY IR/ROXICODONE) 5 MG immediate release tablet Take 5 mg by mouth every 4 (four) hours as needed.  . pantoprazole (PROTONIX) 20 MG tablet Take 40 mg by mouth daily.   . Potassium (GNP POTASSIUM) 99 MG TABS Take 2 tablets by mouth daily.  . SYMBICORT 80-4.5 MCG/ACT inhaler Inhale 2 puffs into the lungs daily as needed.     Allergies:   Septra [bactrim] and Sulfamethoxazole-trimethoprim   Social History   Socioeconomic History  . Marital status: Legally Separated    Spouse name: Not on file  . Number of children: Not on file  . Years of education: Not on file  . Highest education level: Not on file  Occupational History  . Not on file  Tobacco Use  . Smoking status: Current Every Day Smoker    Packs/day: 1.00    Years: 28.00    Pack years: 28.00    Types: Cigarettes  . Smokeless tobacco: Never Used  Vaping Use  . Vaping Use: Never used  Substance and Sexual Activity  . Alcohol use: Yes    Alcohol/week: 6.0 standard drinks    Types: 6 Cans of beer per week    Comment: 6 pack of beer / week  . Drug use: No    Types: Heroin    Comment: 1.5 yr. use of Heroin- 1990's   . Sexual activity: Not Currently  Other Topics Concern  . Not on file  Social History Narrative  . Not on file   Social Determinants of Health    Financial Resource Strain:   . Difficulty of Paying Living Expenses:   Food Insecurity:   . Worried About Charity fundraiser in the Last Year:   . Arboriculturist in the Last Year:   Transportation Needs:   . Film/video editor (Medical):   Marland Kitchen Lack of Transportation (Non-Medical):   Physical Activity:   . Days of Exercise per Week:   . Minutes of Exercise per Session:   Stress:   .  Feeling of Stress :   Social Connections:   . Frequency of Communication with Friends and Family:   . Frequency of Social Gatherings with Friends and Family:   . Attends Religious Services:   . Active Member of Clubs or Organizations:   . Attends Archivist Meetings:   Marland Kitchen Marital Status:      Family History: The patient's family history includes Cancer in her father; Diabetes in her mother; Heart disease in her mother; Hyperlipidemia in her mother; Hypertension in her mother. There is no history of Anesthesia problems. ROS:   Please see the history of present illness.    All 14 point review of systems negative except as described per history of present illness  EKGs/Labs/Other Studies Reviewed:      Recent Labs: 04/28/2019: Hemoglobin 11.5; Platelets 319  Recent Lipid Panel No results found for: CHOL, TRIG, HDL, CHOLHDL, VLDL, LDLCALC, LDLDIRECT  Physical Exam:    VS:  BP 116/60   Pulse 73   Ht 5\' 6"  (1.676 m)   Wt 156 lb 6.4 oz (70.9 kg)   SpO2 96%   BMI 25.24 kg/m     Wt Readings from Last 3 Encounters:  07/29/19 156 lb 6.4 oz (70.9 kg)  04/28/19 152 lb (68.9 kg)  04/01/19 158 lb (71.7 kg)     GEN:  Well nourished, well developed in no acute distress HEENT: Normal NECK: No JVD; No carotid bruits LYMPHATICS: No lymphadenopathy CARDIAC: RRR, no murmurs, no rubs, no gallops RESPIRATORY:  Clear to auscultation without rales, wheezing or rhonchi  ABDOMEN: Soft, non-tender, non-distended MUSCULOSKELETAL:  No edema; No deformity  SKIN: Warm and dry LOWER EXTREMITIES:  no swelling NEUROLOGIC:  Alert and oriented x 3 PSYCHIATRIC:  Normal affect   ASSESSMENT:    1. Coronary artery disease involving native coronary artery of native heart without angina pectoris   2. Essential hypertension   3. Tobacco abuse   4. Dyslipidemia    PLAN:    In order of problems listed above:  1. Coronary artery disease test post PCI and stenting of circumflex artery, she is on dual antiplatelets therapy which I would like to continue for extended.  Time because of still not modifiable risk factors.  She is still continue to smoke.  We had a long discussion about diet and I told her if she quit smoking we may be able to cut down her Plavix but until she smokes I favor dual antiplatelets therapy. 2. Essential hypertension blood pressure well controlled continue present management. 3. Tobacco abuse she is wearing 21 mg nicotine patch, I gave her prescription for 14 and 7 and instructed her how to use it she is determined to quit smoking I hope she will succeed. 4. Dyslipidemia she is on high intense statin will ask her to have fasting lipid profile checked today. 5. I did review K PN which showed me her LDL of 68 and HDL of 38 this is from February 05, 2019 this is a decent cholesterol profile however need to be rechecked which we will do today.   Medication Adjustments/Labs and Tests Ordered: Current medicines are reviewed at length with the patient today.  Concerns regarding medicines are outlined above.  Orders Placed This Encounter  Procedures  . Lipid panel   Medication changes:  Meds ordered this encounter  Medications  . nicotine (NICODERM CQ) 14 mg/24hr patch    Sig: Place 1 patch (14 mg total) onto the skin daily.    Dispense:  21 patch    Refill:  0  . nicotine (NICODERM CQ) 7 mg/24hr patch    Sig: Place 1 patch (7 mg total) onto the skin daily.    Dispense:  21 patch    Refill:  0    Signed, Park Liter, MD, [facility] 07/29/2019 3:55 PM    [place]

## 2019-07-29 NOTE — Patient Instructions (Signed)
Medication Instructions:  Your physician has recommended you make the following change in your medication:   Use; Nicoderm patch 14 mg for 3 weeks then 7mg  for 3 weeks.  *If you need a refill on your cardiac medications before your next appointment, please call your pharmacy*   Lab Work: Your physician recommends that you return for lab work today: lipid   If you have labs (blood work) drawn today and your tests are completely normal, you will receive your results only by: Marland Kitchen MyChart Message (if you have MyChart) OR . A paper copy in the mail If you have any lab test that is abnormal or we need to change your treatment, we will call you to review the results.   Testing/Procedures: None.   Follow-Up: At [facility], you and your health needs are our priority.  As part of our continuing mission to provide you with exceptional heart care, we have created designated Provider Care Teams.  These Care Teams include your primary Cardiologist (physician) and Advanced Practice Providers (APPs -  Physician Assistants and Nurse Practitioners) who all work together to provide you with the care you need, when you need it.  We recommend signing up for the patient portal called "MyChart".  Sign up information is provided on this After Visit Summary.  MyChart is used to connect with patients for Virtual Visits (Telemedicine).  Patients are able to view lab/test results, encounter notes, upcoming appointments, etc.  Non-urgent messages can be sent to your provider as well.   To learn more about what you can do with MyChart, go to [email or web address].    Your next appointment:   5 month(s)  The format for your next appointment:   In Person  Provider:   Jenne Campus, MD   Other Instructions  Nicotine skin patches What is this medicine? NICOTINE ([place] teen) helps people stop smoking. The patches replace the nicotine found in cigarettes and help to decrease withdrawal effects. They are  most effective when used in combination with a stop-smoking program. This medicine may be used for other purposes; ask your health care provider or pharmacist if you have questions. COMMON BRAND NAME(S): Habitrol, Nicoderm CQ, Nicotrol What should I tell my health care provider before I take this medicine? They need to know if you have any of these conditions:  diabetes  heart disease, angina, irregular heartbeat or previous heart attack  high blood pressure  lung disease, including asthma  overactive thyroid  pheochromocytoma  seizures or a history of seizures  skin problems, like eczema  stomach problems or ulcers  an unusual or allergic reaction to nicotine, adhesives, other medicines, foods, dyes, or preservatives  pregnant or trying to get pregnant  breast-feeding How should I use this medicine? This medicine is for use on the skin. Follow the directions that come with the patches. Find an area of skin on your upper arm, chest, or back that is clean, dry, greaseless, undamaged and hairless. Wash hands with plain soap and water. Do not use anything that contains aloe, lanolin or glycerin as these may prevent the patch from sticking. Dry thoroughly. Remove the patch from the sealed pouch. Do not try to cut or trim the patch. Using your palm, press the patch firmly in place for 10 seconds to make sure that there is good contact with your skin. After applying the patch, wash your hands. Change the patch every day, keeping to a regular schedule. When you apply a new patch, use a new  area of skin. Wait at least 1 week before using the same area again. Talk to your pediatrician regarding the use of this medicine in children. Special care may be needed. Overdosage: If you think you have taken too much of this medicine contact a poison control center or emergency room at once. NOTE: This medicine is only for you. Do not share this medicine with others. What if I miss a dose? If you  forget to replace a patch, use it as soon as you can. Only use one patch at a time and do not leave on the skin for longer than directed. If a patch falls off, you can replace it, but keep to your schedule and remove the patch at the right time. What may interact with this medicine?  medicines for asthma  medicines for blood pressure  medicines for mental depression This list may not describe all possible interactions. Give your health care provider a list of all the medicines, herbs, non-prescription drugs, or dietary supplements you use. Also tell them if you smoke, drink alcohol, or use illegal drugs. Some items may interact with your medicine. What should I watch for while using this medicine? You should begin using the nicotine patch the day you stop smoking. It is okay if you do not succeed at your attempt to quit and have a cigarette. You can still continue your quit attempt and keep using the product as directed. Just throw away your cigarettes and get back to your quit plan. You can keep the patch in place during swimming, bathing, and showering. If your patch falls off during these activities, replace it. When you first apply the patch, your skin may itch or burn. This should go away soon. When you remove a patch, the skin may look red, but this should only last for a few days. Call your doctor or health care professional if skin redness does not go away after 4 days, if your skin swells, or if you get a rash. If you are a diabetic and you quit smoking, the effects of insulin may be increased and you may need to reduce your insulin dose. Check with your doctor or health care professional about how you should adjust your insulin dose. If you are going to have a magnetic resonance imaging (MRI) procedure, tell your MRI technician if you have this patch on your body. It must be removed before a MRI. What side effects may I notice from receiving this medicine? Side effects that you should  report to your doctor or health care professional as soon as possible:  allergic reactions like skin rash, itching or hives, swelling of the face, lips, or tongue  breathing problems  changes in hearing  changes in vision  chest pain  cold sweats  confusion  fast, irregular heartbeat  feeling faint or lightheaded, falls  headache  increased saliva  skin redness that lasts more than 4 days  stomach pain  signs and symptoms of nicotine overdose like nausea; vomiting; dizziness; weakness; and rapid heartbeat Side effects that usually do not require medical attention (report to your doctor or health care professional if they continue or are bothersome):  diarrhea  dry mouth  hiccups  irritability  nervousness or restlessness  trouble sleeping or vivid dreams This list may not describe all possible side effects. Call your doctor for medical advice about side effects. You may report side effects to FDA at 1-800-FDA-1088. Where should I keep my medicine? Keep out of the reach  of children. Store at room temperature between 20 and 25 degrees C (68 and 77 degrees F). Protect from heat and light. Store in International aid/development worker until ready to use. Throw away unused medicine after the expiration date. When you remove a patch, fold with sticky sides together; put in an empty opened pouch and throw away. NOTE: This sheet is a summary. It may not cover all possible information. If you have questions about this medicine, talk to your doctor, pharmacist, or health care provider.  2020 Elsevier/Gold Standard (2013-12-13 15:46:21)

## 2019-07-30 LAB — LIPID PANEL
Chol/HDL Ratio: 3.5 ratio (ref 0.0–4.4)
Cholesterol, Total: 138 mg/dL (ref 100–199)
HDL: 39 mg/dL — ABNORMAL LOW (ref >39)
LDL Chol Calc (NIH): 73 mg/dL (ref 0–99)
Triglycerides: 151 mg/dL — ABNORMAL HIGH (ref 0–149)
VLDL Cholesterol Cal: 26 mg/dL (ref 5–40)

## 2019-08-06 DIAGNOSIS — D638 Anemia in other chronic diseases classified elsewhere: Secondary | ICD-10-CM | POA: Diagnosis not present

## 2019-08-06 DIAGNOSIS — Z09 Encounter for follow-up examination after completed treatment for conditions other than malignant neoplasm: Secondary | ICD-10-CM | POA: Diagnosis not present

## 2019-08-06 DIAGNOSIS — G8929 Other chronic pain: Secondary | ICD-10-CM | POA: Diagnosis not present

## 2019-08-06 DIAGNOSIS — Z87891 Personal history of nicotine dependence: Secondary | ICD-10-CM | POA: Diagnosis not present

## 2019-08-06 DIAGNOSIS — M549 Dorsalgia, unspecified: Secondary | ICD-10-CM | POA: Diagnosis not present

## 2019-08-06 DIAGNOSIS — Z6825 Body mass index (BMI) 25.0-25.9, adult: Secondary | ICD-10-CM | POA: Diagnosis not present

## 2019-08-06 DIAGNOSIS — I10 Essential (primary) hypertension: Secondary | ICD-10-CM | POA: Diagnosis not present

## 2019-08-06 DIAGNOSIS — I2510 Atherosclerotic heart disease of native coronary artery without angina pectoris: Secondary | ICD-10-CM | POA: Diagnosis not present

## 2019-08-16 ENCOUNTER — Other Ambulatory Visit: Payer: Self-pay | Admitting: Cardiology

## 2019-08-17 DIAGNOSIS — Z124 Encounter for screening for malignant neoplasm of cervix: Secondary | ICD-10-CM | POA: Diagnosis not present

## 2019-08-17 DIAGNOSIS — R102 Pelvic and perineal pain: Secondary | ICD-10-CM | POA: Diagnosis not present

## 2019-08-19 DIAGNOSIS — F1910 Other psychoactive substance abuse, uncomplicated: Secondary | ICD-10-CM | POA: Diagnosis not present

## 2019-08-19 DIAGNOSIS — F11988 Opioid use, unspecified with other opioid-induced disorder: Secondary | ICD-10-CM | POA: Diagnosis not present

## 2019-08-19 DIAGNOSIS — Z79891 Long term (current) use of opiate analgesic: Secondary | ICD-10-CM | POA: Diagnosis not present

## 2019-08-19 DIAGNOSIS — F1120 Opioid dependence, uncomplicated: Secondary | ICD-10-CM | POA: Diagnosis not present

## 2019-08-19 DIAGNOSIS — Z6825 Body mass index (BMI) 25.0-25.9, adult: Secondary | ICD-10-CM | POA: Diagnosis not present

## 2019-08-19 DIAGNOSIS — F1994 Other psychoactive substance use, unspecified with psychoactive substance-induced mood disorder: Secondary | ICD-10-CM | POA: Diagnosis not present

## 2019-08-19 DIAGNOSIS — F1123 Opioid dependence with withdrawal: Secondary | ICD-10-CM | POA: Diagnosis not present

## 2019-08-19 DIAGNOSIS — F1121 Opioid dependence, in remission: Secondary | ICD-10-CM | POA: Diagnosis not present

## 2019-08-31 ENCOUNTER — Telehealth: Payer: Self-pay | Admitting: Cardiology

## 2019-08-31 MED ORDER — NICOTINE 7 MG/24HR TD PT24: 7.0000 mg | MEDICATED_PATCH | Freq: Every day | TRANSDERMAL | 0 refills | Status: DC

## 2019-08-31 NOTE — Telephone Encounter (Signed)
30-day supply should be enough

## 2019-08-31 NOTE — Telephone Encounter (Signed)
30 day refill sent in per request and Dr. Wendy Poet approval.

## 2019-08-31 NOTE — Telephone Encounter (Signed)
New Message   *STAT* If patient is at the pharmacy, call can be transferred to refill team.   1. Which medications need to be refilled? (please list name of each medication and dose if known) nicotine (NICODERM CQ) 7 mg/24hr patch  2. Which pharmacy/location (including street and city if local pharmacy) is medication to be sent to? [id number] - [place], [place] - [address] AT [address] [place] [address]  3. Do they need a 30 day or 90 day supply? 30 day

## 2019-09-02 DIAGNOSIS — F1994 Other psychoactive substance use, unspecified with psychoactive substance-induced mood disorder: Secondary | ICD-10-CM | POA: Diagnosis not present

## 2019-09-02 DIAGNOSIS — F1123 Opioid dependence with withdrawal: Secondary | ICD-10-CM | POA: Diagnosis not present

## 2019-09-02 DIAGNOSIS — F1121 Opioid dependence, in remission: Secondary | ICD-10-CM | POA: Diagnosis not present

## 2019-09-02 DIAGNOSIS — F1910 Other psychoactive substance abuse, uncomplicated: Secondary | ICD-10-CM | POA: Diagnosis not present

## 2019-09-02 DIAGNOSIS — Z6825 Body mass index (BMI) 25.0-25.9, adult: Secondary | ICD-10-CM | POA: Diagnosis not present

## 2019-09-02 DIAGNOSIS — Z79891 Long term (current) use of opiate analgesic: Secondary | ICD-10-CM | POA: Diagnosis not present

## 2019-09-02 DIAGNOSIS — F11988 Opioid use, unspecified with other opioid-induced disorder: Secondary | ICD-10-CM | POA: Diagnosis not present

## 2019-09-06 DIAGNOSIS — N9489 Other specified conditions associated with female genital organs and menstrual cycle: Secondary | ICD-10-CM

## 2019-09-06 DIAGNOSIS — R102 Pelvic and perineal pain: Secondary | ICD-10-CM | POA: Diagnosis not present

## 2019-09-06 HISTORY — DX: Other specified conditions associated with female genital organs and menstrual cycle: N94.89

## 2019-09-14 DIAGNOSIS — M545 Low back pain: Secondary | ICD-10-CM | POA: Diagnosis not present

## 2019-09-16 DIAGNOSIS — F1121 Opioid dependence, in remission: Secondary | ICD-10-CM | POA: Diagnosis not present

## 2019-09-16 DIAGNOSIS — Z6825 Body mass index (BMI) 25.0-25.9, adult: Secondary | ICD-10-CM | POA: Diagnosis not present

## 2019-09-16 DIAGNOSIS — F1123 Opioid dependence with withdrawal: Secondary | ICD-10-CM | POA: Diagnosis not present

## 2019-09-16 DIAGNOSIS — F1120 Opioid dependence, uncomplicated: Secondary | ICD-10-CM | POA: Diagnosis not present

## 2019-09-16 DIAGNOSIS — F11988 Opioid use, unspecified with other opioid-induced disorder: Secondary | ICD-10-CM | POA: Diagnosis not present

## 2019-09-16 DIAGNOSIS — F331 Major depressive disorder, recurrent, moderate: Secondary | ICD-10-CM | POA: Diagnosis not present

## 2019-09-16 DIAGNOSIS — Z79891 Long term (current) use of opiate analgesic: Secondary | ICD-10-CM | POA: Diagnosis not present

## 2019-09-16 DIAGNOSIS — J449 Chronic obstructive pulmonary disease, unspecified: Secondary | ICD-10-CM | POA: Diagnosis not present

## 2019-09-23 DIAGNOSIS — K7460 Unspecified cirrhosis of liver: Secondary | ICD-10-CM | POA: Diagnosis not present

## 2019-09-23 DIAGNOSIS — E11610 Type 2 diabetes mellitus with diabetic neuropathic arthropathy: Secondary | ICD-10-CM | POA: Diagnosis not present

## 2019-09-23 DIAGNOSIS — J449 Chronic obstructive pulmonary disease, unspecified: Secondary | ICD-10-CM | POA: Diagnosis not present

## 2019-09-23 DIAGNOSIS — F1123 Opioid dependence with withdrawal: Secondary | ICD-10-CM | POA: Diagnosis not present

## 2019-09-23 DIAGNOSIS — I2510 Atherosclerotic heart disease of native coronary artery without angina pectoris: Secondary | ICD-10-CM | POA: Diagnosis not present

## 2019-09-23 DIAGNOSIS — M549 Dorsalgia, unspecified: Secondary | ICD-10-CM | POA: Diagnosis not present

## 2019-09-23 DIAGNOSIS — F11988 Opioid use, unspecified with other opioid-induced disorder: Secondary | ICD-10-CM | POA: Diagnosis not present

## 2019-09-23 DIAGNOSIS — I10 Essential (primary) hypertension: Secondary | ICD-10-CM | POA: Diagnosis not present

## 2019-09-23 DIAGNOSIS — E782 Mixed hyperlipidemia: Secondary | ICD-10-CM | POA: Diagnosis not present

## 2019-09-23 DIAGNOSIS — D638 Anemia in other chronic diseases classified elsewhere: Secondary | ICD-10-CM | POA: Diagnosis not present

## 2019-09-28 ENCOUNTER — Other Ambulatory Visit: Payer: Self-pay | Admitting: Cardiology

## 2019-09-28 DIAGNOSIS — I25118 Atherosclerotic heart disease of native coronary artery with other forms of angina pectoris: Secondary | ICD-10-CM

## 2019-09-30 DIAGNOSIS — J449 Chronic obstructive pulmonary disease, unspecified: Secondary | ICD-10-CM | POA: Diagnosis not present

## 2019-09-30 DIAGNOSIS — Z6825 Body mass index (BMI) 25.0-25.9, adult: Secondary | ICD-10-CM | POA: Diagnosis not present

## 2019-09-30 DIAGNOSIS — F331 Major depressive disorder, recurrent, moderate: Secondary | ICD-10-CM | POA: Diagnosis not present

## 2019-09-30 DIAGNOSIS — Z79891 Long term (current) use of opiate analgesic: Secondary | ICD-10-CM | POA: Diagnosis not present

## 2019-09-30 DIAGNOSIS — F1121 Opioid dependence, in remission: Secondary | ICD-10-CM | POA: Diagnosis not present

## 2019-09-30 DIAGNOSIS — F1123 Opioid dependence with withdrawal: Secondary | ICD-10-CM | POA: Diagnosis not present

## 2019-09-30 DIAGNOSIS — F11988 Opioid use, unspecified with other opioid-induced disorder: Secondary | ICD-10-CM | POA: Diagnosis not present

## 2019-10-20 DIAGNOSIS — F331 Major depressive disorder, recurrent, moderate: Secondary | ICD-10-CM | POA: Diagnosis not present

## 2019-10-20 DIAGNOSIS — F1121 Opioid dependence, in remission: Secondary | ICD-10-CM | POA: Diagnosis not present

## 2019-10-20 DIAGNOSIS — Z6825 Body mass index (BMI) 25.0-25.9, adult: Secondary | ICD-10-CM | POA: Diagnosis not present

## 2019-10-20 DIAGNOSIS — F411 Generalized anxiety disorder: Secondary | ICD-10-CM | POA: Diagnosis not present

## 2019-10-20 DIAGNOSIS — Z79891 Long term (current) use of opiate analgesic: Secondary | ICD-10-CM | POA: Diagnosis not present

## 2019-10-21 DIAGNOSIS — M545 Low back pain: Secondary | ICD-10-CM | POA: Diagnosis not present

## 2019-10-21 DIAGNOSIS — Z1389 Encounter for screening for other disorder: Secondary | ICD-10-CM | POA: Diagnosis not present

## 2019-10-21 DIAGNOSIS — M461 Sacroiliitis, not elsewhere classified: Secondary | ICD-10-CM | POA: Diagnosis not present

## 2019-10-21 DIAGNOSIS — M25511 Pain in right shoulder: Secondary | ICD-10-CM | POA: Diagnosis not present

## 2019-10-21 DIAGNOSIS — G894 Chronic pain syndrome: Secondary | ICD-10-CM | POA: Diagnosis not present

## 2019-11-03 DIAGNOSIS — F411 Generalized anxiety disorder: Secondary | ICD-10-CM | POA: Diagnosis not present

## 2019-11-03 DIAGNOSIS — F331 Major depressive disorder, recurrent, moderate: Secondary | ICD-10-CM | POA: Diagnosis not present

## 2019-11-03 DIAGNOSIS — Z6825 Body mass index (BMI) 25.0-25.9, adult: Secondary | ICD-10-CM | POA: Diagnosis not present

## 2019-11-03 DIAGNOSIS — Z79891 Long term (current) use of opiate analgesic: Secondary | ICD-10-CM | POA: Diagnosis not present

## 2019-11-03 DIAGNOSIS — F1121 Opioid dependence, in remission: Secondary | ICD-10-CM | POA: Diagnosis not present

## 2019-11-04 DIAGNOSIS — M461 Sacroiliitis, not elsewhere classified: Secondary | ICD-10-CM | POA: Diagnosis not present

## 2019-11-04 DIAGNOSIS — M25511 Pain in right shoulder: Secondary | ICD-10-CM | POA: Diagnosis not present

## 2019-11-04 DIAGNOSIS — M545 Low back pain, unspecified: Secondary | ICD-10-CM | POA: Diagnosis not present

## 2019-11-04 DIAGNOSIS — G894 Chronic pain syndrome: Secondary | ICD-10-CM | POA: Diagnosis not present

## 2019-11-04 DIAGNOSIS — Z79891 Long term (current) use of opiate analgesic: Secondary | ICD-10-CM | POA: Diagnosis not present

## 2019-11-04 DIAGNOSIS — Z1389 Encounter for screening for other disorder: Secondary | ICD-10-CM | POA: Diagnosis not present

## 2019-11-17 DIAGNOSIS — F1121 Opioid dependence, in remission: Secondary | ICD-10-CM | POA: Diagnosis not present

## 2019-11-17 DIAGNOSIS — F331 Major depressive disorder, recurrent, moderate: Secondary | ICD-10-CM | POA: Diagnosis not present

## 2019-11-17 DIAGNOSIS — F411 Generalized anxiety disorder: Secondary | ICD-10-CM | POA: Diagnosis not present

## 2019-11-17 DIAGNOSIS — Z6825 Body mass index (BMI) 25.0-25.9, adult: Secondary | ICD-10-CM | POA: Diagnosis not present

## 2019-11-17 DIAGNOSIS — Z79891 Long term (current) use of opiate analgesic: Secondary | ICD-10-CM | POA: Diagnosis not present

## 2019-11-23 DIAGNOSIS — F17210 Nicotine dependence, cigarettes, uncomplicated: Secondary | ICD-10-CM | POA: Diagnosis not present

## 2019-11-23 DIAGNOSIS — I700 Atherosclerosis of aorta: Secondary | ICD-10-CM | POA: Diagnosis not present

## 2019-11-23 DIAGNOSIS — I25118 Atherosclerotic heart disease of native coronary artery with other forms of angina pectoris: Secondary | ICD-10-CM | POA: Diagnosis not present

## 2019-11-23 DIAGNOSIS — M4856XA Collapsed vertebra, not elsewhere classified, lumbar region, initial encounter for fracture: Secondary | ICD-10-CM | POA: Diagnosis not present

## 2019-11-23 DIAGNOSIS — Z955 Presence of coronary angioplasty implant and graft: Secondary | ICD-10-CM | POA: Diagnosis not present

## 2019-11-23 DIAGNOSIS — R21 Rash and other nonspecific skin eruption: Secondary | ICD-10-CM | POA: Diagnosis not present

## 2019-11-23 DIAGNOSIS — I249 Acute ischemic heart disease, unspecified: Secondary | ICD-10-CM | POA: Diagnosis not present

## 2019-11-23 DIAGNOSIS — N83292 Other ovarian cyst, left side: Secondary | ICD-10-CM | POA: Diagnosis not present

## 2019-11-23 DIAGNOSIS — E119 Type 2 diabetes mellitus without complications: Secondary | ICD-10-CM | POA: Diagnosis not present

## 2019-11-23 DIAGNOSIS — N3289 Other specified disorders of bladder: Secondary | ICD-10-CM | POA: Diagnosis not present

## 2019-11-23 DIAGNOSIS — I10 Essential (primary) hypertension: Secondary | ICD-10-CM | POA: Diagnosis not present

## 2019-11-23 DIAGNOSIS — R079 Chest pain, unspecified: Secondary | ICD-10-CM | POA: Diagnosis not present

## 2019-11-23 DIAGNOSIS — Z9189 Other specified personal risk factors, not elsewhere classified: Secondary | ICD-10-CM

## 2019-11-23 DIAGNOSIS — E785 Hyperlipidemia, unspecified: Secondary | ICD-10-CM | POA: Diagnosis not present

## 2019-11-23 DIAGNOSIS — I2510 Atherosclerotic heart disease of native coronary artery without angina pectoris: Secondary | ICD-10-CM | POA: Diagnosis not present

## 2019-11-23 DIAGNOSIS — I313 Pericardial effusion (noninflammatory): Secondary | ICD-10-CM | POA: Diagnosis not present

## 2019-11-23 DIAGNOSIS — K2920 Alcoholic gastritis without bleeding: Secondary | ICD-10-CM | POA: Diagnosis not present

## 2019-11-23 DIAGNOSIS — K2100 Gastro-esophageal reflux disease with esophagitis, without bleeding: Secondary | ICD-10-CM | POA: Diagnosis not present

## 2019-11-23 DIAGNOSIS — Z7982 Long term (current) use of aspirin: Secondary | ICD-10-CM | POA: Diagnosis not present

## 2019-11-23 HISTORY — DX: Other specified personal risk factors, not elsewhere classified: Z91.89

## 2019-12-01 DIAGNOSIS — F331 Major depressive disorder, recurrent, moderate: Secondary | ICD-10-CM | POA: Diagnosis not present

## 2019-12-01 DIAGNOSIS — F411 Generalized anxiety disorder: Secondary | ICD-10-CM | POA: Diagnosis not present

## 2019-12-01 DIAGNOSIS — F1121 Opioid dependence, in remission: Secondary | ICD-10-CM | POA: Diagnosis not present

## 2019-12-01 DIAGNOSIS — Z79891 Long term (current) use of opiate analgesic: Secondary | ICD-10-CM | POA: Diagnosis not present

## 2019-12-07 DIAGNOSIS — Z09 Encounter for follow-up examination after completed treatment for conditions other than malignant neoplasm: Secondary | ICD-10-CM | POA: Diagnosis not present

## 2019-12-07 DIAGNOSIS — Z87891 Personal history of nicotine dependence: Secondary | ICD-10-CM | POA: Diagnosis not present

## 2019-12-07 DIAGNOSIS — K5903 Drug induced constipation: Secondary | ICD-10-CM | POA: Diagnosis not present

## 2019-12-07 DIAGNOSIS — I2510 Atherosclerotic heart disease of native coronary artery without angina pectoris: Secondary | ICD-10-CM | POA: Diagnosis not present

## 2019-12-07 DIAGNOSIS — T402X5A Adverse effect of other opioids, initial encounter: Secondary | ICD-10-CM | POA: Diagnosis not present

## 2019-12-07 DIAGNOSIS — Z23 Encounter for immunization: Secondary | ICD-10-CM | POA: Diagnosis not present

## 2019-12-15 DIAGNOSIS — Z79891 Long term (current) use of opiate analgesic: Secondary | ICD-10-CM | POA: Diagnosis not present

## 2019-12-15 DIAGNOSIS — Z6825 Body mass index (BMI) 25.0-25.9, adult: Secondary | ICD-10-CM | POA: Diagnosis not present

## 2019-12-15 DIAGNOSIS — F331 Major depressive disorder, recurrent, moderate: Secondary | ICD-10-CM | POA: Diagnosis not present

## 2019-12-15 DIAGNOSIS — F411 Generalized anxiety disorder: Secondary | ICD-10-CM | POA: Diagnosis not present

## 2019-12-15 DIAGNOSIS — F1121 Opioid dependence, in remission: Secondary | ICD-10-CM | POA: Diagnosis not present

## 2019-12-28 DIAGNOSIS — F411 Generalized anxiety disorder: Secondary | ICD-10-CM | POA: Diagnosis not present

## 2019-12-28 DIAGNOSIS — I2510 Atherosclerotic heart disease of native coronary artery without angina pectoris: Secondary | ICD-10-CM | POA: Diagnosis not present

## 2019-12-28 DIAGNOSIS — I10 Essential (primary) hypertension: Secondary | ICD-10-CM | POA: Diagnosis not present

## 2019-12-28 DIAGNOSIS — D638 Anemia in other chronic diseases classified elsewhere: Secondary | ICD-10-CM | POA: Diagnosis not present

## 2019-12-28 DIAGNOSIS — E11610 Type 2 diabetes mellitus with diabetic neuropathic arthropathy: Secondary | ICD-10-CM | POA: Diagnosis not present

## 2019-12-28 DIAGNOSIS — M549 Dorsalgia, unspecified: Secondary | ICD-10-CM | POA: Diagnosis not present

## 2019-12-28 DIAGNOSIS — J449 Chronic obstructive pulmonary disease, unspecified: Secondary | ICD-10-CM | POA: Diagnosis not present

## 2019-12-28 DIAGNOSIS — Z23 Encounter for immunization: Secondary | ICD-10-CM | POA: Diagnosis not present

## 2019-12-28 DIAGNOSIS — K7460 Unspecified cirrhosis of liver: Secondary | ICD-10-CM | POA: Diagnosis not present

## 2019-12-29 ENCOUNTER — Other Ambulatory Visit: Payer: Self-pay

## 2019-12-29 DIAGNOSIS — F411 Generalized anxiety disorder: Secondary | ICD-10-CM | POA: Diagnosis not present

## 2019-12-29 DIAGNOSIS — F1121 Opioid dependence, in remission: Secondary | ICD-10-CM | POA: Diagnosis not present

## 2019-12-29 DIAGNOSIS — Z6825 Body mass index (BMI) 25.0-25.9, adult: Secondary | ICD-10-CM | POA: Diagnosis not present

## 2019-12-29 DIAGNOSIS — Z79891 Long term (current) use of opiate analgesic: Secondary | ICD-10-CM | POA: Diagnosis not present

## 2019-12-29 DIAGNOSIS — F331 Major depressive disorder, recurrent, moderate: Secondary | ICD-10-CM | POA: Diagnosis not present

## 2019-12-31 ENCOUNTER — Other Ambulatory Visit: Payer: Self-pay

## 2019-12-31 ENCOUNTER — Encounter: Payer: Self-pay | Admitting: Cardiology

## 2019-12-31 ENCOUNTER — Ambulatory Visit: Payer: Medicare HMO | Admitting: Cardiology

## 2019-12-31 VITALS — BP 100/54 | HR 82 | Ht 64.5 in | Wt 150.0 lb

## 2019-12-31 DIAGNOSIS — Z720 Tobacco use: Secondary | ICD-10-CM | POA: Diagnosis not present

## 2019-12-31 DIAGNOSIS — I10 Essential (primary) hypertension: Secondary | ICD-10-CM | POA: Diagnosis not present

## 2019-12-31 DIAGNOSIS — I2510 Atherosclerotic heart disease of native coronary artery without angina pectoris: Secondary | ICD-10-CM

## 2019-12-31 DIAGNOSIS — E119 Type 2 diabetes mellitus without complications: Secondary | ICD-10-CM

## 2019-12-31 DIAGNOSIS — E785 Hyperlipidemia, unspecified: Secondary | ICD-10-CM | POA: Diagnosis not present

## 2019-12-31 NOTE — Progress Notes (Signed)
Cardiology Office Note:    Date:  12/31/2019   ID:  Makayla Fischer, DOB 1960/02/05, MRN [id number]  PCP:  Jeanie Sewer, NP  Cardiologist:  Jenne Campus, MD    Referring MD: Jeanie Sewer, NP   Chief Complaint  Patient presents with  . Follow-up  I was recently in the hospital  History of Present Illness:    Makayla Fischer is a 59 y.o. female past medical history significant for coronary artery disease, status post PTCA and stenting with drug-eluting stent to circumflex artery in August 2020.  Also have history of essential hypertension, dyslipidemia she is a chronic smoker.  Recently she is being in the hospital and I did review record from her being in hospital.  Quite extensive evaluation was done all were negative, biochemical markers for myocardial injury were negative.  She was find to be severely constipated.  The reason for admission to the hospital was diffuse pain in splitting pain in the chest.  Since that time she is doing well.  Denies have any chest pain tightness squeezing pressure burning chest no issues.  Past Medical History:  Diagnosis Date  . Anginal pain ([place])   . Anxiety    h/o panic attacks   . Arthritis    all over, back, knees   . CAD (coronary artery disease)   . Chronic coronary artery disease 08/10/2017  . Chronic pain   . Coronary disease PTCA and stenting with drug-eluting stent to circumflex artery in August 2020 08/10/2017  . Daily consumption of alcohol 08/10/2017  . Depression   . Essential hypertension 08/10/2017  . Fatty liver    by 01/15/11 CT scan  . Fibromyalgia   . GERD (gastroesophageal reflux disease)   . Hyperlipidemia   . Hypertension    sees Dr. Posey Rea, stress test in 01/2011  . Hypomagnesemia 08/10/2017  . Hyponatremia 08/10/2017  . Incisional hernia    with obstruction  . Myocardial infarction ([place]) 02/2008  . Neuromuscular disorder ([facility])    neuropathy  . Osteoporosis   . Shortness of breath   . Status  post insertion of drug eluting coronary artery stent 09/15/2018  . Substance abuse ([place])    drugs and alcohol  . Tobacco abuse 08/10/2017  . Type II diabetes mellitus, well controlled ([place]) 08/10/2017  . Uncomplicated opioid dependence ([place]) 08/10/2017  . Unstable angina ([place]) 09/15/2018  . Varicose veins of leg with complications 2/59/5638  . Varicose veins of lower extremities with other complications 7/56/4332    Past Surgical History:  Procedure Laterality Date  . CARDIAC CATHETERIZATION     [facility], x2 stents   . CARDIAC SURGERY    . [place]  . TONSILLECTOMY AND ADENOIDECTOMY    . TUBAL LIGATION    . VENTRAL HERNIA REPAIR  07/12/2011   Procedure: LAPAROSCOPIC VENTRAL HERNIA;  Surgeon: Adin Hector, MD;  Location: [place];  Service: General;  Laterality: N/A;  laparoscopic repair incisional hernia with mesh    Current Medications: Current Meds  Medication Sig  . albuterol (VENTOLIN HFA) 108 (90 Base) MCG/ACT inhaler Inhale 2 puffs into the lungs every 6 (six) hours as needed.  . Ascorbic Acid (VITAMIN C) 500 MG CAPS Take 1 capsule by mouth daily.  Marland Kitchen aspirin 81 MG tablet Take 81 mg by mouth daily.    Marland Kitchen atorvastatin (LIPITOR) 80 MG tablet TAKE 1 TABLET(80 MG) BY MOUTH DAILY  . Biotin 1 MG CAPS Take 1  tablet by mouth daily.  . Buprenorphine HCl-Naloxone HCl (SUBOXONE) 8-2 MG FILM Place 1 Film under the tongue 2 (two) times daily.  . carvedilol (COREG) 6.25 MG tablet Take 6.25 mg by mouth 2 (two) times daily with a meal.   . clonazePAM (KLONOPIN) 0.5 MG tablet Take 0.5 mg by mouth 2 (two) times daily as needed. For anxiety  . clopidogrel (PLAVIX) 75 MG tablet TAKE 1 TABLET(75 MG) BY MOUTH DAILY  . Cyanocobalamin 1000 MCG/ML LIQD Take by mouth.  . cyclobenzaprine (FLEXERIL) 10 MG tablet Take 10 mg by mouth 3 (three) times daily as needed. For muscle pain  . folic acid (FOLVITE) 160 MCG tablet Take 1 tablet by mouth daily.  Marland Kitchen gabapentin  (NEURONTIN) 600 MG tablet Take 600 mg by mouth daily.  . hydrochlorothiazide (HYDRODIURIL) 12.5 MG tablet Take by mouth daily. Take 12.5 mg by mouth daily  . isosorbide mononitrate (IMDUR) 60 MG 24 hr tablet Take 30 mg by mouth daily.   Marland Kitchen lisinopril (PRINIVIL,ZESTRIL) 20 MG tablet Take 20 mg by mouth daily after lunch daily after lunch.   . magnesium oxide (MAG-OX) 400 MG tablet Take 1 tablet by mouth 2 (two) times daily.  . metFORMIN (GLUMETZA) 500 MG (MOD) 24 hr tablet Take 500 mg by mouth daily with breakfast.  . nicotine (NICODERM CQ) 7 mg/24hr patch Place 1 patch (7 mg total) onto the skin daily.  . nitroGLYCERIN (NITROSTAT) 0.4 MG SL tablet TAKE 1 TABLET UNDER THE TONGUE AS NEEDED FOR CHEST PAIN. CAN TAKE UP TO 3 PILLS 5 MINUTES APART. CALL 911 IF ALL TAKEN AND NO RELIEF. MAX 3 TABLETS  . pantoprazole (PROTONIX) 40 MG tablet   . potassium chloride SA (KLOR-CON M15) 15 MEQ tablet Take 15 mEq by mouth 2 (two) times daily. 595  . vitamin B-6 (PYRIDOXINE) 25 MG tablet Take 25 mg by mouth daily.  . [DISCONTINUED] metFORMIN (GLUCOPHAGE) 500 MG tablet Take 500 mg by mouth 2 (two) times daily with a meal.     Allergies:   Septra [bactrim] and Sulfamethoxazole-trimethoprim   Social History   Socioeconomic History  . Marital status: Legally Separated    Spouse name: Not on file  . Number of children: Not on file  . Years of education: Not on file  . Highest education level: Not on file  Occupational History  . Not on file  Tobacco Use  . Smoking status: Current Every Day Smoker    Packs/day: 1.00    Years: 28.00    Pack years: 28.00    Types: Cigarettes  . Smokeless tobacco: Never Used  Vaping Use  . Vaping Use: Never used  Substance and Sexual Activity  . Alcohol use: Yes    Alcohol/week: 6.0 standard drinks    Types: 6 Cans of beer per week    Comment: 6 pack of beer / week  . Drug use: No    Types: Heroin    Comment: 1.5 yr. use of Heroin- 1990's   . Sexual activity: Not  Currently  Other Topics Concern  . Not on file  Social History Narrative  . Not on file   Social Determinants of Health   Financial Resource Strain:   . Difficulty of Paying Living Expenses: Not on file  Food Insecurity:   . Worried About Charity fundraiser in the Last Year: Not on file  . Ran Out of Food in the Last Year: Not on file  Transportation Needs:   . Lack of Transportation (  Medical): Not on file  . Lack of Transportation (Non-Medical): Not on file  Physical Activity:   . Days of Exercise per Week: Not on file  . Minutes of Exercise per Session: Not on file  Stress:   . Feeling of Stress : Not on file  Social Connections:   . Frequency of Communication with Friends and Family: Not on file  . Frequency of Social Gatherings with Friends and Family: Not on file  . Attends Religious Services: Not on file  . Active Member of Clubs or Organizations: Not on file  . Attends Archivist Meetings: Not on file  . Marital Status: Not on file     Family History: The patient's family history includes Cancer in her father; Diabetes in her mother; Heart disease in her mother; Hyperlipidemia in her mother; Hypertension in her mother. There is no history of Anesthesia problems. ROS:   Please see the history of present illness.    All 14 point review of systems negative except as described per history of present illness  EKGs/Labs/Other Studies Reviewed:      Recent Labs: 04/28/2019: Hemoglobin 11.5; Platelets 319  Recent Lipid Panel    Component Value Date/Time   CHOL 138 07/29/2019 1600   TRIG 151 (H) 07/29/2019 1600   HDL 39 (L) 07/29/2019 1600   CHOLHDL 3.5 07/29/2019 1600   LDLCALC 73 07/29/2019 1600    Physical Exam:    VS:  BP (!) 100/54 (BP Location: Right Arm, Patient Position: Sitting)   Pulse 82   Ht 5' 4.5" (1.638 m)   Wt 150 lb (68 kg)   SpO2 94%   BMI 25.35 kg/m     Wt Readings from Last 3 Encounters:  12/31/19 150 lb (68 kg)  07/29/19 156  lb 6.4 oz (70.9 kg)  04/28/19 152 lb (68.9 kg)     GEN:  Well nourished, well developed in no acute distress HEENT: Normal NECK: No JVD; No carotid bruits LYMPHATICS: No lymphadenopathy CARDIAC: RRR, no murmurs, no rubs, no gallops RESPIRATORY:  Clear to auscultation without rales, wheezing or rhonchi  ABDOMEN: Soft, non-tender, non-distended MUSCULOSKELETAL:  No edema; No deformity  SKIN: Warm and dry LOWER EXTREMITIES: no swelling NEUROLOGIC:  Alert and oriented x 3 PSYCHIATRIC:  Normal affect   ASSESSMENT:    1. Coronary artery disease involving native coronary artery of native heart without angina pectoris   2. Essential hypertension   3. Type II diabetes mellitus, well controlled ([place])   4. Tobacco abuse   5. Dyslipidemia    PLAN:    In order of problems listed above:  1. Coronary disease stable from that point review I decided to extend the duration of dual antiplatelet therapy because of multiple risk factors are not modifiable.  That include her smoking.  She was recently in the hospital and I did review that visit in the hospital for atypical chest pain.  Work-up negative. 2. Essential hypertension: Blood pressure well controlled continue present management. 3. Dyslipidemia: I will recheck her fasting lipid profile today. 4. Tobacco abuse: Still smokes however she is determined that she must quit and she is wearing nicotine patch and try to get much less. 5. Type 2 diabetes followed by internal medicine team.   Medication Adjustments/Labs and Tests Ordered: Current medicines are reviewed at length with the patient today.  Concerns regarding medicines are outlined above.  No orders of the defined types were placed in this encounter.  Medication changes: No orders of the  defined types were placed in this encounter.   Signed, Park Liter, MD, [facility] 12/31/2019 2:34 PM    [place]

## 2019-12-31 NOTE — Addendum Note (Signed)
Addended by: Senaida Ores on: 12/31/2019 02:38 PM   Modules accepted: Orders

## 2019-12-31 NOTE — Patient Instructions (Signed)
Medication Instructions:  Your physician recommends that you continue on your current medications as directed. Please refer to the Current Medication list given to you today.  *If you need a refill on your cardiac medications before your next appointment, please call your pharmacy*   Lab Work: Your physician recommends that you return for lab work today: lipid  If you have labs (blood work) drawn today and your tests are completely normal, you will receive your results only by: . MyChart Message (if you have MyChart) OR . A paper copy in the mail If you have any lab test that is abnormal or we need to change your treatment, we will call you to review the results.   Testing/Procedures: None   Follow-Up: At [facility], you and your health needs are our priority.  As part of our continuing mission to provide you with exceptional heart care, we have created designated Provider Care Teams.  These Care Teams include your primary Cardiologist (physician) and Advanced Practice Providers (APPs -  Physician Assistants and Nurse Practitioners) who all work together to provide you with the care you need, when you need it.  We recommend signing up for the patient portal called "MyChart".  Sign up information is provided on this After Visit Summary.  MyChart is used to connect with patients for Virtual Visits (Telemedicine).  Patients are able to view lab/test results, encounter notes, upcoming appointments, etc.  Non-urgent messages can be sent to your provider as well.   To learn more about what you can do with MyChart, go to [email or web address].    Your next appointment:   5 month(s)  The format for your next appointment:   In Person  Provider:   Robert Krasowski, MD   Other Instructions    

## 2020-01-01 LAB — LIPID PANEL
Chol/HDL Ratio: 3.5 ratio (ref 0.0–4.4)
Cholesterol, Total: 134 mg/dL (ref 100–199)
HDL: 38 mg/dL — ABNORMAL LOW (ref >39)
LDL Chol Calc (NIH): 81 mg/dL (ref 0–99)
Triglycerides: 77 mg/dL (ref 0–149)
VLDL Cholesterol Cal: 15 mg/dL (ref 5–40)

## 2020-01-04 ENCOUNTER — Telehealth: Payer: Self-pay | Admitting: Emergency Medicine

## 2020-01-04 MED ORDER — EZETIMIBE 10 MG PO TABS: 10.0000 mg | ORAL_TABLET | Freq: Every day | ORAL | 1 refills | Status: DC

## 2020-01-04 NOTE — Telephone Encounter (Signed)
-----   Message from Park Liter, MD sent at 01/03/2020 11:39 AM EST ----- Cholesterol looks decent but need to be better I propose to add Zetia 10 mg daily to medical regiment

## 2020-01-04 NOTE — Telephone Encounter (Signed)
Called patient informed her of results. Advised her to start zetia 10 mg daily. She verbally understood no further questions.

## 2020-01-12 DIAGNOSIS — Z6825 Body mass index (BMI) 25.0-25.9, adult: Secondary | ICD-10-CM | POA: Diagnosis not present

## 2020-01-12 DIAGNOSIS — Z79891 Long term (current) use of opiate analgesic: Secondary | ICD-10-CM | POA: Diagnosis not present

## 2020-01-12 DIAGNOSIS — F411 Generalized anxiety disorder: Secondary | ICD-10-CM | POA: Diagnosis not present

## 2020-01-12 DIAGNOSIS — F331 Major depressive disorder, recurrent, moderate: Secondary | ICD-10-CM | POA: Diagnosis not present

## 2020-01-12 DIAGNOSIS — F1121 Opioid dependence, in remission: Secondary | ICD-10-CM | POA: Diagnosis not present

## 2020-01-24 ENCOUNTER — Other Ambulatory Visit: Payer: Self-pay | Admitting: Cardiology

## 2020-01-24 DIAGNOSIS — E782 Mixed hyperlipidemia: Secondary | ICD-10-CM

## 2020-01-24 DIAGNOSIS — I25118 Atherosclerotic heart disease of native coronary artery with other forms of angina pectoris: Secondary | ICD-10-CM

## 2020-01-26 DIAGNOSIS — F1121 Opioid dependence, in remission: Secondary | ICD-10-CM | POA: Diagnosis not present

## 2020-01-26 DIAGNOSIS — Z79891 Long term (current) use of opiate analgesic: Secondary | ICD-10-CM | POA: Diagnosis not present

## 2020-01-26 DIAGNOSIS — F411 Generalized anxiety disorder: Secondary | ICD-10-CM | POA: Diagnosis not present

## 2020-01-26 DIAGNOSIS — F331 Major depressive disorder, recurrent, moderate: Secondary | ICD-10-CM | POA: Diagnosis not present

## 2020-01-26 DIAGNOSIS — Z6825 Body mass index (BMI) 25.0-25.9, adult: Secondary | ICD-10-CM | POA: Diagnosis not present

## 2020-01-31 DIAGNOSIS — Z20822 Contact with and (suspected) exposure to covid-19: Secondary | ICD-10-CM | POA: Diagnosis not present

## 2020-02-01 DIAGNOSIS — Z20822 Contact with and (suspected) exposure to covid-19: Secondary | ICD-10-CM | POA: Diagnosis not present

## 2020-02-08 ENCOUNTER — Telehealth: Payer: Self-pay | Admitting: Cardiology

## 2020-02-08 NOTE — Telephone Encounter (Signed)
Patient states she tested positive for COVID on 02/02/20 and she only had cold symptoms up until today. She states this morning her HR was extremely low. She states when she woke up it was 50 and the lowest it got to was 44.   STAT if HR is under 50 or over 120 (normal HR is 60-100 beats per minute)  1) What is your heart rate?  56   2) Do you have a log of your heart rate readings (document readings)?  02/08/20: 50, 47, 44, 56  3) Do you have any other symptoms? Constipation and a dull ache in the chest (denies pain - she assumes is due to the constipation)

## 2020-02-08 NOTE — Telephone Encounter (Signed)
Called and spoke to patient. She reports some chest discomfort that is all over chest. No radiation of pain no shortness of breath. She does have covid. She is already concerned because her heart rate is a little lower than normal getting into the 40s and 50s. Spoke with Dr. Agustin Cree and advised her to go to emergency department if chest sensation gets worse. Also advised her she can decrease carvedilol to 6.25 (1/2) tablet twice daily and see if that helps heart rate. She will let us know if she needs anything further. She verbally understands all information. No further questions at this time.

## 2020-02-09 DIAGNOSIS — Z6825 Body mass index (BMI) 25.0-25.9, adult: Secondary | ICD-10-CM | POA: Diagnosis not present

## 2020-02-09 DIAGNOSIS — F411 Generalized anxiety disorder: Secondary | ICD-10-CM | POA: Diagnosis not present

## 2020-02-09 DIAGNOSIS — F331 Major depressive disorder, recurrent, moderate: Secondary | ICD-10-CM | POA: Diagnosis not present

## 2020-02-09 DIAGNOSIS — F1121 Opioid dependence, in remission: Secondary | ICD-10-CM | POA: Diagnosis not present

## 2020-02-09 DIAGNOSIS — Z79891 Long term (current) use of opiate analgesic: Secondary | ICD-10-CM | POA: Diagnosis not present

## 2020-02-23 DIAGNOSIS — F411 Generalized anxiety disorder: Secondary | ICD-10-CM | POA: Diagnosis not present

## 2020-02-23 DIAGNOSIS — Z6824 Body mass index (BMI) 24.0-24.9, adult: Secondary | ICD-10-CM | POA: Diagnosis not present

## 2020-02-23 DIAGNOSIS — F1120 Opioid dependence, uncomplicated: Secondary | ICD-10-CM | POA: Diagnosis not present

## 2020-02-23 DIAGNOSIS — F331 Major depressive disorder, recurrent, moderate: Secondary | ICD-10-CM | POA: Diagnosis not present

## 2020-02-23 DIAGNOSIS — F1121 Opioid dependence, in remission: Secondary | ICD-10-CM | POA: Diagnosis not present

## 2020-02-23 DIAGNOSIS — Z79891 Long term (current) use of opiate analgesic: Secondary | ICD-10-CM | POA: Diagnosis not present

## 2020-03-01 DIAGNOSIS — M461 Sacroiliitis, not elsewhere classified: Secondary | ICD-10-CM | POA: Diagnosis not present

## 2020-03-01 DIAGNOSIS — M25511 Pain in right shoulder: Secondary | ICD-10-CM | POA: Diagnosis not present

## 2020-03-01 DIAGNOSIS — Z1389 Encounter for screening for other disorder: Secondary | ICD-10-CM | POA: Diagnosis not present

## 2020-03-01 DIAGNOSIS — G894 Chronic pain syndrome: Secondary | ICD-10-CM | POA: Diagnosis not present

## 2020-03-01 DIAGNOSIS — M545 Low back pain, unspecified: Secondary | ICD-10-CM | POA: Diagnosis not present

## 2020-03-07 DIAGNOSIS — Z1389 Encounter for screening for other disorder: Secondary | ICD-10-CM | POA: Diagnosis not present

## 2020-03-07 DIAGNOSIS — G894 Chronic pain syndrome: Secondary | ICD-10-CM | POA: Diagnosis not present

## 2020-03-07 DIAGNOSIS — M545 Low back pain, unspecified: Secondary | ICD-10-CM | POA: Diagnosis not present

## 2020-03-07 DIAGNOSIS — M25511 Pain in right shoulder: Secondary | ICD-10-CM | POA: Diagnosis not present

## 2020-03-07 DIAGNOSIS — M461 Sacroiliitis, not elsewhere classified: Secondary | ICD-10-CM | POA: Diagnosis not present

## 2020-03-08 DIAGNOSIS — F411 Generalized anxiety disorder: Secondary | ICD-10-CM | POA: Diagnosis not present

## 2020-03-08 DIAGNOSIS — F1121 Opioid dependence, in remission: Secondary | ICD-10-CM | POA: Diagnosis not present

## 2020-03-08 DIAGNOSIS — Z6824 Body mass index (BMI) 24.0-24.9, adult: Secondary | ICD-10-CM | POA: Diagnosis not present

## 2020-03-08 DIAGNOSIS — F331 Major depressive disorder, recurrent, moderate: Secondary | ICD-10-CM | POA: Diagnosis not present

## 2020-03-08 DIAGNOSIS — Z79891 Long term (current) use of opiate analgesic: Secondary | ICD-10-CM | POA: Diagnosis not present

## 2020-03-16 DIAGNOSIS — R1310 Dysphagia, unspecified: Secondary | ICD-10-CM | POA: Diagnosis not present

## 2020-03-16 DIAGNOSIS — K5904 Chronic idiopathic constipation: Secondary | ICD-10-CM | POA: Diagnosis not present

## 2020-03-16 DIAGNOSIS — K219 Gastro-esophageal reflux disease without esophagitis: Secondary | ICD-10-CM | POA: Diagnosis not present

## 2020-03-16 DIAGNOSIS — K921 Melena: Secondary | ICD-10-CM | POA: Diagnosis not present

## 2020-03-24 ENCOUNTER — Other Ambulatory Visit: Payer: Self-pay | Admitting: Cardiology

## 2020-03-24 DIAGNOSIS — I25118 Atherosclerotic heart disease of native coronary artery with other forms of angina pectoris: Secondary | ICD-10-CM

## 2020-03-24 NOTE — Telephone Encounter (Signed)
Refill sent to pharmacy.   

## 2020-03-29 DIAGNOSIS — F411 Generalized anxiety disorder: Secondary | ICD-10-CM | POA: Diagnosis not present

## 2020-03-29 DIAGNOSIS — Z79891 Long term (current) use of opiate analgesic: Secondary | ICD-10-CM | POA: Diagnosis not present

## 2020-03-29 DIAGNOSIS — F331 Major depressive disorder, recurrent, moderate: Secondary | ICD-10-CM | POA: Diagnosis not present

## 2020-03-29 DIAGNOSIS — F1121 Opioid dependence, in remission: Secondary | ICD-10-CM | POA: Diagnosis not present

## 2020-03-29 DIAGNOSIS — Z6824 Body mass index (BMI) 24.0-24.9, adult: Secondary | ICD-10-CM | POA: Diagnosis not present

## 2020-04-05 DIAGNOSIS — E11610 Type 2 diabetes mellitus with diabetic neuropathic arthropathy: Secondary | ICD-10-CM | POA: Diagnosis not present

## 2020-04-05 DIAGNOSIS — I10 Essential (primary) hypertension: Secondary | ICD-10-CM | POA: Diagnosis not present

## 2020-04-05 DIAGNOSIS — I2510 Atherosclerotic heart disease of native coronary artery without angina pectoris: Secondary | ICD-10-CM | POA: Diagnosis not present

## 2020-04-05 DIAGNOSIS — K219 Gastro-esophageal reflux disease without esophagitis: Secondary | ICD-10-CM | POA: Diagnosis not present

## 2020-04-05 DIAGNOSIS — K7460 Unspecified cirrhosis of liver: Secondary | ICD-10-CM | POA: Diagnosis not present

## 2020-04-05 DIAGNOSIS — D638 Anemia in other chronic diseases classified elsewhere: Secondary | ICD-10-CM | POA: Diagnosis not present

## 2020-04-05 DIAGNOSIS — F411 Generalized anxiety disorder: Secondary | ICD-10-CM | POA: Diagnosis not present

## 2020-04-05 DIAGNOSIS — E782 Mixed hyperlipidemia: Secondary | ICD-10-CM | POA: Diagnosis not present

## 2020-04-05 DIAGNOSIS — J449 Chronic obstructive pulmonary disease, unspecified: Secondary | ICD-10-CM | POA: Diagnosis not present

## 2020-04-07 ENCOUNTER — Telehealth: Payer: Self-pay | Admitting: Cardiology

## 2020-04-07 NOTE — Telephone Encounter (Signed)
Pt c/o of Chest Pain: STAT if CP now or developed within 24 hours  1. Are you having CP right now? no  2. Are you experiencing any other symptoms (ex. SOB, nausea, vomiting, sweating)? Sloshing in ears  3. How long have you been experiencing CP? This morning  4. Is your CP continuous or coming and going? Came and went  5. Have you taken Nitroglycerin? yes  Patient states she has been hearing her heart beat and it sounds like a sloshing. She states she also had chest pain the morning that went away when she took nitroglycerin. She states it tends to hurt worse after she eats. She states she BP today was 120/80.

## 2020-04-07 NOTE — Telephone Encounter (Signed)
Pt with CP this morning that resolved after one nitro.  Has not returned.  For a few weeks not pt has been able to "feel my pulse in my ears".  BP today was 120/80.  CP has been much more severe in the past.  Does have a hx of stents and angina.  Currently on Ranexa 500mg  BID to help with angina.  Pt admits to still smoking.  Denies SOB, lightheadedness, dizziness, diaphoresis or HA when CP was occurring.  Does have swelling in BLEs that has worsened over the last week.  No weights.  Takes HCTZ 12.5mg  QD.  Advised I will send message to Dr. Agustin Cree for review.  Advised if pain returns and is not resolved with Nitro to report to ER.

## 2020-04-11 NOTE — Telephone Encounter (Signed)
Lets call her and ask her how she is doing if still chest pain is a problem she need to have a follow-up

## 2020-04-11 NOTE — Telephone Encounter (Signed)
Patient reports chest pain is not as bad lately but the racing of heart and pounding in head. She did find out by her pcp that she is anemic yesterday. Nothing was given for the anemia. She can not take iron. She does have some shortness of breath, no swelling in lower extremities.

## 2020-04-12 DIAGNOSIS — F1121 Opioid dependence, in remission: Secondary | ICD-10-CM | POA: Diagnosis not present

## 2020-04-12 DIAGNOSIS — F411 Generalized anxiety disorder: Secondary | ICD-10-CM | POA: Diagnosis not present

## 2020-04-12 DIAGNOSIS — Z79891 Long term (current) use of opiate analgesic: Secondary | ICD-10-CM | POA: Diagnosis not present

## 2020-04-12 DIAGNOSIS — F331 Major depressive disorder, recurrent, moderate: Secondary | ICD-10-CM | POA: Diagnosis not present

## 2020-04-12 DIAGNOSIS — Z6824 Body mass index (BMI) 24.0-24.9, adult: Secondary | ICD-10-CM | POA: Diagnosis not present

## 2020-04-14 ENCOUNTER — Encounter: Payer: Self-pay | Admitting: Emergency Medicine

## 2020-04-14 NOTE — Telephone Encounter (Signed)
Sent letter requesting these labs.

## 2020-04-14 NOTE — Telephone Encounter (Signed)
Please try to get report of her blood tests from her primary care physician and see how anemic while she

## 2020-04-19 DIAGNOSIS — I10 Essential (primary) hypertension: Secondary | ICD-10-CM | POA: Diagnosis not present

## 2020-04-19 DIAGNOSIS — Z79891 Long term (current) use of opiate analgesic: Secondary | ICD-10-CM | POA: Diagnosis not present

## 2020-04-19 DIAGNOSIS — Z8719 Personal history of other diseases of the digestive system: Secondary | ICD-10-CM | POA: Diagnosis not present

## 2020-04-19 DIAGNOSIS — Z8679 Personal history of other diseases of the circulatory system: Secondary | ICD-10-CM | POA: Diagnosis not present

## 2020-04-19 DIAGNOSIS — I249 Acute ischemic heart disease, unspecified: Secondary | ICD-10-CM | POA: Diagnosis not present

## 2020-04-19 DIAGNOSIS — Z7902 Long term (current) use of antithrombotics/antiplatelets: Secondary | ICD-10-CM | POA: Diagnosis not present

## 2020-04-19 DIAGNOSIS — Z79899 Other long term (current) drug therapy: Secondary | ICD-10-CM | POA: Diagnosis not present

## 2020-04-19 DIAGNOSIS — J449 Chronic obstructive pulmonary disease, unspecified: Secondary | ICD-10-CM | POA: Diagnosis not present

## 2020-04-19 DIAGNOSIS — R079 Chest pain, unspecified: Secondary | ICD-10-CM | POA: Diagnosis not present

## 2020-04-19 DIAGNOSIS — F17200 Nicotine dependence, unspecified, uncomplicated: Secondary | ICD-10-CM | POA: Diagnosis not present

## 2020-04-19 DIAGNOSIS — F17210 Nicotine dependence, cigarettes, uncomplicated: Secondary | ICD-10-CM | POA: Diagnosis not present

## 2020-04-20 DIAGNOSIS — Z8679 Personal history of other diseases of the circulatory system: Secondary | ICD-10-CM | POA: Diagnosis not present

## 2020-04-20 DIAGNOSIS — J449 Chronic obstructive pulmonary disease, unspecified: Secondary | ICD-10-CM | POA: Diagnosis not present

## 2020-04-20 DIAGNOSIS — F17200 Nicotine dependence, unspecified, uncomplicated: Secondary | ICD-10-CM | POA: Diagnosis not present

## 2020-04-20 DIAGNOSIS — R079 Chest pain, unspecified: Secondary | ICD-10-CM | POA: Diagnosis not present

## 2020-04-20 DIAGNOSIS — R0600 Dyspnea, unspecified: Secondary | ICD-10-CM | POA: Diagnosis not present

## 2020-04-26 DIAGNOSIS — Z6824 Body mass index (BMI) 24.0-24.9, adult: Secondary | ICD-10-CM | POA: Diagnosis not present

## 2020-04-26 DIAGNOSIS — F331 Major depressive disorder, recurrent, moderate: Secondary | ICD-10-CM | POA: Diagnosis not present

## 2020-04-26 DIAGNOSIS — F411 Generalized anxiety disorder: Secondary | ICD-10-CM | POA: Diagnosis not present

## 2020-04-26 DIAGNOSIS — F1121 Opioid dependence, in remission: Secondary | ICD-10-CM | POA: Diagnosis not present

## 2020-04-26 DIAGNOSIS — Z79891 Long term (current) use of opiate analgesic: Secondary | ICD-10-CM | POA: Diagnosis not present

## 2020-04-28 DIAGNOSIS — I10 Essential (primary) hypertension: Secondary | ICD-10-CM | POA: Diagnosis not present

## 2020-04-28 DIAGNOSIS — Z09 Encounter for follow-up examination after completed treatment for conditions other than malignant neoplasm: Secondary | ICD-10-CM | POA: Diagnosis not present

## 2020-04-28 DIAGNOSIS — I209 Angina pectoris, unspecified: Secondary | ICD-10-CM | POA: Diagnosis not present

## 2020-04-28 DIAGNOSIS — F411 Generalized anxiety disorder: Secondary | ICD-10-CM | POA: Diagnosis not present

## 2020-05-03 DIAGNOSIS — J069 Acute upper respiratory infection, unspecified: Secondary | ICD-10-CM | POA: Diagnosis not present

## 2020-05-10 DIAGNOSIS — Z79891 Long term (current) use of opiate analgesic: Secondary | ICD-10-CM | POA: Diagnosis not present

## 2020-05-10 DIAGNOSIS — F1121 Opioid dependence, in remission: Secondary | ICD-10-CM | POA: Diagnosis not present

## 2020-05-10 DIAGNOSIS — Z6824 Body mass index (BMI) 24.0-24.9, adult: Secondary | ICD-10-CM | POA: Diagnosis not present

## 2020-05-10 DIAGNOSIS — F331 Major depressive disorder, recurrent, moderate: Secondary | ICD-10-CM | POA: Diagnosis not present

## 2020-05-10 DIAGNOSIS — F411 Generalized anxiety disorder: Secondary | ICD-10-CM | POA: Diagnosis not present

## 2020-05-24 ENCOUNTER — Other Ambulatory Visit: Payer: Self-pay

## 2020-05-24 DIAGNOSIS — K760 Fatty (change of) liver, not elsewhere classified: Secondary | ICD-10-CM | POA: Insufficient documentation

## 2020-05-24 DIAGNOSIS — F1910 Other psychoactive substance abuse, uncomplicated: Secondary | ICD-10-CM | POA: Insufficient documentation

## 2020-05-24 DIAGNOSIS — F331 Major depressive disorder, recurrent, moderate: Secondary | ICD-10-CM | POA: Diagnosis not present

## 2020-05-24 DIAGNOSIS — F419 Anxiety disorder, unspecified: Secondary | ICD-10-CM | POA: Insufficient documentation

## 2020-05-24 DIAGNOSIS — Z6824 Body mass index (BMI) 24.0-24.9, adult: Secondary | ICD-10-CM | POA: Diagnosis not present

## 2020-05-24 DIAGNOSIS — Z79891 Long term (current) use of opiate analgesic: Secondary | ICD-10-CM | POA: Diagnosis not present

## 2020-05-24 DIAGNOSIS — E785 Hyperlipidemia, unspecified: Secondary | ICD-10-CM | POA: Insufficient documentation

## 2020-05-24 DIAGNOSIS — K219 Gastro-esophageal reflux disease without esophagitis: Secondary | ICD-10-CM | POA: Insufficient documentation

## 2020-05-24 DIAGNOSIS — M797 Fibromyalgia: Secondary | ICD-10-CM | POA: Insufficient documentation

## 2020-05-24 DIAGNOSIS — R0602 Shortness of breath: Secondary | ICD-10-CM | POA: Insufficient documentation

## 2020-05-24 DIAGNOSIS — F1120 Opioid dependence, uncomplicated: Secondary | ICD-10-CM | POA: Diagnosis not present

## 2020-05-24 DIAGNOSIS — F1121 Opioid dependence, in remission: Secondary | ICD-10-CM | POA: Diagnosis not present

## 2020-05-24 DIAGNOSIS — I209 Angina pectoris, unspecified: Secondary | ICD-10-CM | POA: Insufficient documentation

## 2020-05-24 DIAGNOSIS — F411 Generalized anxiety disorder: Secondary | ICD-10-CM | POA: Diagnosis not present

## 2020-05-24 DIAGNOSIS — G709 Myoneural disorder, unspecified: Secondary | ICD-10-CM | POA: Insufficient documentation

## 2020-05-24 DIAGNOSIS — F32A Depression, unspecified: Secondary | ICD-10-CM | POA: Insufficient documentation

## 2020-05-24 DIAGNOSIS — M1990 Unspecified osteoarthritis, unspecified site: Secondary | ICD-10-CM | POA: Insufficient documentation

## 2020-05-24 DIAGNOSIS — G8929 Other chronic pain: Secondary | ICD-10-CM | POA: Insufficient documentation

## 2020-05-24 DIAGNOSIS — I2510 Atherosclerotic heart disease of native coronary artery without angina pectoris: Secondary | ICD-10-CM | POA: Insufficient documentation

## 2020-05-30 ENCOUNTER — Encounter: Payer: Self-pay | Admitting: Cardiology

## 2020-05-30 ENCOUNTER — Other Ambulatory Visit: Payer: Self-pay

## 2020-05-30 ENCOUNTER — Ambulatory Visit: Payer: Medicare HMO | Admitting: Cardiology

## 2020-05-30 VITALS — BP 100/54 | HR 63 | Ht 64.5 in | Wt 147.0 lb

## 2020-05-30 DIAGNOSIS — I2510 Atherosclerotic heart disease of native coronary artery without angina pectoris: Secondary | ICD-10-CM

## 2020-05-30 DIAGNOSIS — E119 Type 2 diabetes mellitus without complications: Secondary | ICD-10-CM

## 2020-05-30 DIAGNOSIS — F17200 Nicotine dependence, unspecified, uncomplicated: Secondary | ICD-10-CM | POA: Diagnosis not present

## 2020-05-30 DIAGNOSIS — E782 Mixed hyperlipidemia: Secondary | ICD-10-CM

## 2020-05-30 NOTE — Patient Instructions (Signed)

## 2020-05-30 NOTE — Progress Notes (Signed)
Cardiology Office Note:    Date:  05/30/2020   ID:  Makayla Fischer, DOB Jul 02, 1960, MRN [id number]  PCP:  Jeanie Sewer, NP  Cardiologist:  Jenne Campus, MD    Referring MD: Jeanie Sewer, NP   Chief Complaint  Patient presents with  . Follow-up  Am doing fine  History of Present Illness:    Makayla Fischer is a 60 y.o. female with past medical history significant for coronary disease, status post PTCA and stenting with drug-eluting stent to circumflex artery in August 2020, latest estimation of coronary artery disease was done in March by stress test to round of hospital which was negative.  She does have history of essential hypertension, dyslipidemia she is a chronic smoker.  She comes today 2 months for follow-up overall seems to be doing well.  She described to have episode that she feels her heart pounding in her head.  Her primary care physician asked her to split lisinopril to 10 in the morning and 10 in afternoon that seems to be helping.  She still continues to smoke.  Past Medical History:  Diagnosis Date  . Adnexal mass 09/06/2019  . Anginal pain ([place])   . Anxiety    h/o panic attacks   . Arthritis    all over, back, knees   . CAD (coronary artery disease)   . Chronic coronary artery disease 08/10/2017  . Chronic pain   . Coronary disease PTCA and stenting with drug-eluting stent to circumflex artery in August 2020 08/10/2017  . Daily consumption of alcohol 08/10/2017  . Depression   . Dyslipidemia 04/28/2019  . Essential hypertension 08/10/2017  . Fatty liver    by 01/15/11 CT scan  . Fibromyalgia   . GERD (gastroesophageal reflux disease)   . Hyperlipidemia   . Hypertension    sees Dr. Posey Rea, stress test in 01/2011  . Hypomagnesemia 08/10/2017  . Hyponatremia 08/10/2017  . Incisional hernia    with obstruction  . Myocardial infarction ([place]) 02/2008  . Neuromuscular disorder (H[facility])    neuropathy  . Osteoporosis   . Shortness of breath   .  Status post insertion of drug eluting coronary artery stent 09/15/2018  . Substance abuse ([place])    drugs and alcohol  . Tobacco abuse 08/10/2017  . Type II diabetes mellitus, well controlled ([place]) 08/10/2017  . Uncomplicated opioid dependence ([place]) 08/10/2017  . Unstable angina ([place]) 09/15/2018  . Varicose veins of leg with complications 0/17/7939  . Varicose veins of lower extremities with other complications 0/30/0923  . Varicose veins of lower extremity 10/12/2013    Past Surgical History:  Procedure Laterality Date  . CARDIAC CATHETERIZATION     2/2010[facility], x2 stents   . CARDIAC SURGERY    . [place]  . TONSILLECTOMY AND ADENOIDECTOMY    . TUBAL LIGATION    . VENTRAL HERNIA REPAIR  07/12/2011   Procedure: LAPAROSCOPIC VENTRAL HERNIA;  Surgeon: Adin Hector, MD;  Location: [place];  Service: General;  Laterality: N/A;  laparoscopic repair incisional hernia with mesh    Current Medications: Current Meds  Medication Sig  . albuterol (VENTOLIN HFA) 108 (90 Base) MCG/ACT inhaler Inhale 2 puffs into the lungs every 6 (six) hours as needed for wheezing or shortness of breath.  . Ascorbic Acid (VITAMIN C) 500 MG CAPS Take 1 capsule by mouth daily.  Marland Kitchen aspirin 81 MG tablet Take 81 mg by mouth daily.  Marland Kitchen atorvastatin (LIPITOR)  80 MG tablet TAKE 1 TABLET(80 MG) BY MOUTH DAILY (Patient taking differently: Take 80 mg by mouth daily.)  . Biotin 1 MG CAPS Take 1 tablet by mouth daily.  . Buprenorphine HCl-Naloxone HCl 8-2 MG FILM Place 1 Film under the tongue 2 (two) times daily.  . carvedilol (COREG) 6.25 MG tablet Take 6.25 mg by mouth 2 (two) times daily with a meal.  . clonazePAM (KLONOPIN) 0.5 MG tablet Take 1 mg by mouth 2 (two) times daily. For anxiety  . clopidogrel (PLAVIX) 75 MG tablet TAKE 1 TABLET(75 MG) BY MOUTH DAILY (Patient taking differently: Take 75 mg by mouth daily.)  . Cyanocobalamin 1000 MCG/ML LIQD Take 4 tablets by mouth daily.  .  cyclobenzaprine (FLEXERIL) 10 MG tablet Take 10 mg by mouth 3 (three) times daily as needed for muscle spasms. For muscle pain  . ezetimibe (ZETIA) 10 MG tablet Take 1 tablet (10 mg total) by mouth daily.  . folic acid (FOLVITE) 629 MCG tablet Take 1 tablet by mouth daily.  Marland Kitchen gabapentin (NEURONTIN) 600 MG tablet Take 240 mg by mouth daily.  . hydrochlorothiazide (HYDRODIURIL) 12.5 MG tablet Take 12.5 mg by mouth daily. Take 12.5 mg by mouth daily  . isosorbide mononitrate (IMDUR) 60 MG 24 hr tablet Take 30 mg by mouth daily.   Marland Kitchen lisinopril (PRINIVIL,ZESTRIL) 20 MG tablet Take 10 mg by mouth in the morning and at bedtime.  . magnesium oxide (MAG-OX) 400 MG tablet Take 1 tablet by mouth 2 (two) times daily.  . metFORMIN (GLUMETZA) 500 MG (MOD) 24 hr tablet Take 500 mg by mouth as needed (If glucose elevate).  . nitroGLYCERIN (NITROSTAT) 0.4 MG SL tablet TAKE 1 TABLET UNDER THE TONGUE AS NEEDED FOR CHEST PAIN. CAN TAKE UP TO 3 PILLS 5 MINUTES APART. CALL 911 IF ALL TAKEN AND NO RELIEF. MAX 3 TABLETS (Patient taking differently: Place 0.4 mg under the tongue every 5 (five) minutes as needed for chest pain.)  . pantoprazole (PROTONIX) 40 MG tablet Take 40 mg by mouth daily.  . potassium gluconate 595 (99 K) MG TABS tablet Take 595 mg by mouth 2 (two) times daily.  . vitamin B-6 (PYRIDOXINE) 25 MG tablet Take 25 mg by mouth daily.  . [DISCONTINUED] famotidine (PEPCID) 40 MG tablet Take 1 tablet by mouth as needed for indigestion or heartburn.  . [DISCONTINUED] pantoprazole (PROTONIX) 20 MG tablet Take 40 mg by mouth daily.  . [DISCONTINUED] potassium chloride SA (KLOR-CON M15) 15 MEQ tablet Take 15 mEq by mouth 2 (two) times daily. 595     Allergies:   Septra [bactrim] and Sulfamethoxazole-trimethoprim   Social History   Socioeconomic History  . Marital status: Married    Spouse name: Not on file  . Number of children: Not on file  . Years of education: Not on file  . Highest education level:  Not on file  Occupational History  . Not on file  Tobacco Use  . Smoking status: Current Every Day Smoker    Packs/day: 1.00    Years: 28.00    Pack years: 28.00    Types: Cigarettes  . Smokeless tobacco: Never Used  Vaping Use  . Vaping Use: Never used  Substance and Sexual Activity  . Alcohol use: Yes    Alcohol/week: 6.0 standard drinks    Types: 6 Cans of beer per week    Comment: 6 pack of beer / week  . Drug use: No    Types: Heroin    Comment:  1.5 yr. use of Heroin- 1990's   . Sexual activity: Not Currently  Other Topics Concern  . Not on file  Social History Narrative  . Not on file   Social Determinants of Health   Financial Resource Strain: Not on file  Food Insecurity: Not on file  Transportation Needs: Not on file  Physical Activity: Not on file  Stress: Not on file  Social Connections: Not on file     Family History: The patient's family history includes Cancer in her father; Diabetes in her mother; Heart disease in her mother; Hyperlipidemia in her mother; Hypertension in her mother. There is no history of Anesthesia problems. ROS:   Please see the history of present illness.    All 14 point review of systems negative except as described per history of present illness  EKGs/Labs/Other Studies Reviewed:      Recent Labs: No results found for requested labs within last 8760 hours.  Recent Lipid Panel    Component Value Date/Time   CHOL 134 12/31/2019 1440   TRIG 77 12/31/2019 1440   HDL 38 (L) 12/31/2019 1440   CHOLHDL 3.5 12/31/2019 1440   LDLCALC 81 12/31/2019 1440    Physical Exam:    VS:  BP (!) 100/54 (BP Location: Left Arm, Patient Position: Sitting)   Pulse 63   Ht 5' 4.5" (1.638 m)   Wt 147 lb (66.7 kg)   SpO2 98%   BMI 24.84 kg/m     Wt Readings from Last 3 Encounters:  05/30/20 147 lb (66.7 kg)  12/31/19 150 lb (68 kg)  07/29/19 156 lb 6.4 oz (70.9 kg)     GEN:  Well nourished, well developed in no acute distress HEENT:  Normal NECK: No JVD; No carotid bruits LYMPHATICS: No lymphadenopathy CARDIAC: RRR, no murmurs, no rubs, no gallops RESPIRATORY:  Clear to auscultation without rales, wheezing or rhonchi  ABDOMEN: Soft, non-tender, non-distended MUSCULOSKELETAL:  No edema; No deformity  SKIN: Warm and dry LOWER EXTREMITIES: no swelling NEUROLOGIC:  Alert and oriented x 3 PSYCHIATRIC:  Normal affect   ASSESSMENT:    1. Coronary artery disease involving native coronary artery of native heart without angina pectoris   2. Type II diabetes mellitus, well controlled ([place])   3. Mixed hyperlipidemia   4. Smoking    PLAN:    In order of problems listed above:  1. Coronary disease stable I did review stress test from the hospital looks good continue present management. 2. Type 2 diabetes stable hemoglobin A1c 5.5 this data is from K PN from 04/05/2020 we will continue present management. 3. Mixed dyslipidemia I did review her fasting lipid profile from April 05, 2020 with LDL of 81 and HDL of 49 we will continue high intensity statin. 4. Smoking obviously huge problem I told her she must quit.  She described to have what appears to be Raynaud's phenomenon.  I told her quitting smoking will help with this as well.  She understands she was try to work on it.   Medication Adjustments/Labs and Tests Ordered: Current medicines are reviewed at length with the patient today.  Concerns regarding medicines are outlined above.  No orders of the defined types were placed in this encounter.  Medication changes: No orders of the defined types were placed in this encounter.   Signed, Park Liter, MD, [facility] 05/30/2020 2:59 PM    [place]

## 2020-06-07 DIAGNOSIS — F1121 Opioid dependence, in remission: Secondary | ICD-10-CM | POA: Diagnosis not present

## 2020-06-07 DIAGNOSIS — Z79891 Long term (current) use of opiate analgesic: Secondary | ICD-10-CM | POA: Diagnosis not present

## 2020-06-07 DIAGNOSIS — F331 Major depressive disorder, recurrent, moderate: Secondary | ICD-10-CM | POA: Diagnosis not present

## 2020-06-07 DIAGNOSIS — F411 Generalized anxiety disorder: Secondary | ICD-10-CM | POA: Diagnosis not present

## 2020-06-09 DIAGNOSIS — E119 Type 2 diabetes mellitus without complications: Secondary | ICD-10-CM | POA: Diagnosis not present

## 2020-06-21 DIAGNOSIS — Z6824 Body mass index (BMI) 24.0-24.9, adult: Secondary | ICD-10-CM | POA: Diagnosis not present

## 2020-06-21 DIAGNOSIS — F331 Major depressive disorder, recurrent, moderate: Secondary | ICD-10-CM | POA: Diagnosis not present

## 2020-06-21 DIAGNOSIS — F411 Generalized anxiety disorder: Secondary | ICD-10-CM | POA: Diagnosis not present

## 2020-06-21 DIAGNOSIS — F1121 Opioid dependence, in remission: Secondary | ICD-10-CM | POA: Diagnosis not present

## 2020-06-21 DIAGNOSIS — J449 Chronic obstructive pulmonary disease, unspecified: Secondary | ICD-10-CM | POA: Diagnosis not present

## 2020-06-21 DIAGNOSIS — Z79891 Long term (current) use of opiate analgesic: Secondary | ICD-10-CM | POA: Diagnosis not present

## 2020-07-05 DIAGNOSIS — Z79891 Long term (current) use of opiate analgesic: Secondary | ICD-10-CM | POA: Diagnosis not present

## 2020-07-05 DIAGNOSIS — F331 Major depressive disorder, recurrent, moderate: Secondary | ICD-10-CM | POA: Diagnosis not present

## 2020-07-05 DIAGNOSIS — F411 Generalized anxiety disorder: Secondary | ICD-10-CM | POA: Diagnosis not present

## 2020-07-05 DIAGNOSIS — F1121 Opioid dependence, in remission: Secondary | ICD-10-CM | POA: Diagnosis not present

## 2020-07-06 DIAGNOSIS — I83899 Varicose veins of unspecified lower extremities with other complications: Secondary | ICD-10-CM | POA: Diagnosis not present

## 2020-07-06 DIAGNOSIS — I83892 Varicose veins of left lower extremities with other complications: Secondary | ICD-10-CM | POA: Diagnosis not present

## 2020-07-12 DIAGNOSIS — K219 Gastro-esophageal reflux disease without esophagitis: Secondary | ICD-10-CM | POA: Diagnosis not present

## 2020-07-12 DIAGNOSIS — F411 Generalized anxiety disorder: Secondary | ICD-10-CM | POA: Diagnosis not present

## 2020-07-12 DIAGNOSIS — E11610 Type 2 diabetes mellitus with diabetic neuropathic arthropathy: Secondary | ICD-10-CM | POA: Diagnosis not present

## 2020-07-12 DIAGNOSIS — I10 Essential (primary) hypertension: Secondary | ICD-10-CM | POA: Diagnosis not present

## 2020-07-12 DIAGNOSIS — K7460 Unspecified cirrhosis of liver: Secondary | ICD-10-CM | POA: Diagnosis not present

## 2020-07-12 DIAGNOSIS — E782 Mixed hyperlipidemia: Secondary | ICD-10-CM | POA: Diagnosis not present

## 2020-07-12 DIAGNOSIS — J449 Chronic obstructive pulmonary disease, unspecified: Secondary | ICD-10-CM | POA: Diagnosis not present

## 2020-07-12 DIAGNOSIS — D638 Anemia in other chronic diseases classified elsewhere: Secondary | ICD-10-CM | POA: Diagnosis not present

## 2020-07-12 DIAGNOSIS — I2510 Atherosclerotic heart disease of native coronary artery without angina pectoris: Secondary | ICD-10-CM | POA: Diagnosis not present

## 2020-07-19 ENCOUNTER — Other Ambulatory Visit: Payer: Self-pay | Admitting: Cardiology

## 2020-07-25 DIAGNOSIS — Z1331 Encounter for screening for depression: Secondary | ICD-10-CM | POA: Diagnosis not present

## 2020-07-25 DIAGNOSIS — Z0000 Encounter for general adult medical examination without abnormal findings: Secondary | ICD-10-CM | POA: Diagnosis not present

## 2020-07-25 DIAGNOSIS — Z9181 History of falling: Secondary | ICD-10-CM | POA: Diagnosis not present

## 2020-07-25 DIAGNOSIS — E785 Hyperlipidemia, unspecified: Secondary | ICD-10-CM | POA: Diagnosis not present

## 2020-07-26 DIAGNOSIS — F411 Generalized anxiety disorder: Secondary | ICD-10-CM | POA: Diagnosis not present

## 2020-07-26 DIAGNOSIS — Z79891 Long term (current) use of opiate analgesic: Secondary | ICD-10-CM | POA: Diagnosis not present

## 2020-07-26 DIAGNOSIS — Z6823 Body mass index (BMI) 23.0-23.9, adult: Secondary | ICD-10-CM | POA: Diagnosis not present

## 2020-07-26 DIAGNOSIS — F1121 Opioid dependence, in remission: Secondary | ICD-10-CM | POA: Diagnosis not present

## 2020-07-26 DIAGNOSIS — F331 Major depressive disorder, recurrent, moderate: Secondary | ICD-10-CM | POA: Diagnosis not present

## 2020-07-28 DIAGNOSIS — J449 Chronic obstructive pulmonary disease, unspecified: Secondary | ICD-10-CM | POA: Diagnosis not present

## 2020-07-28 DIAGNOSIS — E782 Mixed hyperlipidemia: Secondary | ICD-10-CM | POA: Diagnosis not present

## 2020-07-28 DIAGNOSIS — I2510 Atherosclerotic heart disease of native coronary artery without angina pectoris: Secondary | ICD-10-CM | POA: Diagnosis not present

## 2020-07-28 DIAGNOSIS — I10 Essential (primary) hypertension: Secondary | ICD-10-CM | POA: Diagnosis not present

## 2020-08-09 DIAGNOSIS — F411 Generalized anxiety disorder: Secondary | ICD-10-CM | POA: Diagnosis not present

## 2020-08-09 DIAGNOSIS — F1121 Opioid dependence, in remission: Secondary | ICD-10-CM | POA: Diagnosis not present

## 2020-08-09 DIAGNOSIS — F331 Major depressive disorder, recurrent, moderate: Secondary | ICD-10-CM | POA: Diagnosis not present

## 2020-08-15 DIAGNOSIS — I201 Angina pectoris with documented spasm: Secondary | ICD-10-CM | POA: Diagnosis not present

## 2020-08-15 DIAGNOSIS — E785 Hyperlipidemia, unspecified: Secondary | ICD-10-CM | POA: Diagnosis not present

## 2020-08-15 DIAGNOSIS — I249 Acute ischemic heart disease, unspecified: Secondary | ICD-10-CM | POA: Diagnosis not present

## 2020-08-15 DIAGNOSIS — I200 Unstable angina: Secondary | ICD-10-CM | POA: Diagnosis not present

## 2020-08-15 DIAGNOSIS — J449 Chronic obstructive pulmonary disease, unspecified: Secondary | ICD-10-CM | POA: Diagnosis not present

## 2020-08-15 DIAGNOSIS — Z7902 Long term (current) use of antithrombotics/antiplatelets: Secondary | ICD-10-CM | POA: Diagnosis not present

## 2020-08-15 DIAGNOSIS — Z79899 Other long term (current) drug therapy: Secondary | ICD-10-CM | POA: Diagnosis not present

## 2020-08-15 DIAGNOSIS — R0789 Other chest pain: Secondary | ICD-10-CM | POA: Diagnosis not present

## 2020-08-15 DIAGNOSIS — D649 Anemia, unspecified: Secondary | ICD-10-CM | POA: Diagnosis not present

## 2020-08-15 DIAGNOSIS — I209 Angina pectoris, unspecified: Secondary | ICD-10-CM | POA: Diagnosis not present

## 2020-08-15 DIAGNOSIS — R079 Chest pain, unspecified: Secondary | ICD-10-CM | POA: Diagnosis not present

## 2020-08-15 DIAGNOSIS — I2510 Atherosclerotic heart disease of native coronary artery without angina pectoris: Secondary | ICD-10-CM | POA: Diagnosis not present

## 2020-08-15 DIAGNOSIS — I10 Essential (primary) hypertension: Secondary | ICD-10-CM | POA: Diagnosis not present

## 2020-08-15 DIAGNOSIS — K219 Gastro-esophageal reflux disease without esophagitis: Secondary | ICD-10-CM | POA: Diagnosis not present

## 2020-08-16 ENCOUNTER — Ambulatory Visit (HOSPITAL_COMMUNITY)
Admission: AD | Disposition: A | Discharge status: Home / Self Care | Payer: Self-pay | Source: Home / Self Care | Attending: Cardiovascular Disease

## 2020-08-16 ENCOUNTER — Other Ambulatory Visit: Payer: Self-pay

## 2020-08-16 ENCOUNTER — Observation Stay (HOSPITAL_COMMUNITY)
Admission: AD | Admit: 2020-08-16 | Discharge: 2020-08-17 | Disposition: A | Discharge status: Home / Self Care | Payer: Medicare HMO | Attending: Cardiovascular Disease | Admitting: Cardiovascular Disease

## 2020-08-16 ENCOUNTER — Encounter (HOSPITAL_COMMUNITY): Payer: Self-pay | Admitting: Cardiovascular Disease

## 2020-08-16 DIAGNOSIS — Z955 Presence of coronary angioplasty implant and graft: Secondary | ICD-10-CM

## 2020-08-16 DIAGNOSIS — I2510 Atherosclerotic heart disease of native coronary artery without angina pectoris: Secondary | ICD-10-CM | POA: Diagnosis present

## 2020-08-16 DIAGNOSIS — E785 Hyperlipidemia, unspecified: Secondary | ICD-10-CM | POA: Diagnosis present

## 2020-08-16 DIAGNOSIS — Z7984 Long term (current) use of oral hypoglycemic drugs: Secondary | ICD-10-CM | POA: Insufficient documentation

## 2020-08-16 DIAGNOSIS — Z720 Tobacco use: Secondary | ICD-10-CM | POA: Diagnosis present

## 2020-08-16 DIAGNOSIS — I249 Acute ischemic heart disease, unspecified: Secondary | ICD-10-CM | POA: Diagnosis not present

## 2020-08-16 DIAGNOSIS — D649 Anemia, unspecified: Secondary | ICD-10-CM | POA: Diagnosis not present

## 2020-08-16 DIAGNOSIS — R079 Chest pain, unspecified: Secondary | ICD-10-CM | POA: Diagnosis present

## 2020-08-16 DIAGNOSIS — J449 Chronic obstructive pulmonary disease, unspecified: Secondary | ICD-10-CM | POA: Insufficient documentation

## 2020-08-16 DIAGNOSIS — Z7982 Long term (current) use of aspirin: Secondary | ICD-10-CM | POA: Insufficient documentation

## 2020-08-16 DIAGNOSIS — I200 Unstable angina: Secondary | ICD-10-CM | POA: Diagnosis not present

## 2020-08-16 DIAGNOSIS — I25110 Atherosclerotic heart disease of native coronary artery with unstable angina pectoris: Secondary | ICD-10-CM | POA: Diagnosis not present

## 2020-08-16 DIAGNOSIS — E119 Type 2 diabetes mellitus without complications: Secondary | ICD-10-CM | POA: Diagnosis not present

## 2020-08-16 DIAGNOSIS — Z79899 Other long term (current) drug therapy: Secondary | ICD-10-CM | POA: Diagnosis not present

## 2020-08-16 DIAGNOSIS — I10 Essential (primary) hypertension: Secondary | ICD-10-CM | POA: Diagnosis not present

## 2020-08-16 DIAGNOSIS — F17210 Nicotine dependence, cigarettes, uncomplicated: Secondary | ICD-10-CM | POA: Insufficient documentation

## 2020-08-16 DIAGNOSIS — F17200 Nicotine dependence, unspecified, uncomplicated: Secondary | ICD-10-CM | POA: Diagnosis present

## 2020-08-16 HISTORY — PX: LEFT HEART CATH AND CORONARY ANGIOGRAPHY: CATH118249

## 2020-08-16 HISTORY — PX: CORONARY STENT INTERVENTION: CATH118234

## 2020-08-16 HISTORY — PX: CORONARY PRESSURE/FFR STUDY: CATH118243

## 2020-08-16 HISTORY — PX: CORONARY IMAGING/OCT: CATH118326

## 2020-08-16 SURGERY — LEFT HEART CATH AND CORONARY ANGIOGRAPHY
Anesthesia: LOCAL

## 2020-08-16 MED ORDER — CLOPIDOGREL BISULFATE 75 MG PO TABS
75.0000 mg | ORAL_TABLET | Freq: Every day | ORAL | Status: DC
  Administered 2020-08-17: 75 mg via ORAL
  Filled 2020-08-16: qty 1

## 2020-08-16 MED ORDER — HEPARIN SODIUM (PORCINE) 1000 UNIT/ML IJ SOLN
INTRAMUSCULAR | Status: AC
  Filled 2020-08-16: qty 1

## 2020-08-16 MED ORDER — MIDAZOLAM HCL 2 MG/2ML IJ SOLN
INTRAMUSCULAR | Status: AC
  Filled 2020-08-16: qty 2

## 2020-08-16 MED ORDER — VITAMIN C 500 MG PO CAPS: 1.0000 | ORAL_CAPSULE | Freq: Every day | ORAL | Status: DC

## 2020-08-16 MED ORDER — LISINOPRIL 10 MG PO TABS
10.0000 mg | ORAL_TABLET | Freq: Every day | ORAL | Status: DC
  Administered 2020-08-17: 10 mg via ORAL
  Filled 2020-08-16: qty 1

## 2020-08-16 MED ORDER — ENSURE ENLIVE PO LIQD: 237.0000 mL | Freq: Two times a day (BID) | ORAL | Status: DC

## 2020-08-16 MED ORDER — ATORVASTATIN CALCIUM 80 MG PO TABS
80.0000 mg | ORAL_TABLET | Freq: Every day | ORAL | Status: DC
  Administered 2020-08-16 – 2020-08-17 (×2): 80 mg via ORAL
  Filled 2020-08-16 (×2): qty 1

## 2020-08-16 MED ORDER — POTASSIUM GLUCONATE 595 (99 K) MG PO TABS: 595.0000 mg | ORAL_TABLET | Freq: Two times a day (BID) | ORAL | Status: DC

## 2020-08-16 MED ORDER — SODIUM CHLORIDE 0.9% FLUSH: 3.0000 mL | Freq: Two times a day (BID) | INTRAVENOUS | Status: DC

## 2020-08-16 MED ORDER — FENTANYL CITRATE (PF) 100 MCG/2ML IJ SOLN
INTRAMUSCULAR | Status: DC | PRN
  Administered 2020-08-16: 50 ug via INTRAVENOUS

## 2020-08-16 MED ORDER — ONDANSETRON HCL 4 MG/2ML IJ SOLN: 4.0000 mg | Freq: Four times a day (QID) | INTRAMUSCULAR | Status: DC | PRN

## 2020-08-16 MED ORDER — ALBUTEROL SULFATE (2.5 MG/3ML) 0.083% IN NEBU: 3.0000 mL | INHALATION_SOLUTION | Freq: Four times a day (QID) | RESPIRATORY_TRACT | Status: DC | PRN

## 2020-08-16 MED ORDER — GABAPENTIN 300 MG PO CAPS
300.0000 mg | ORAL_CAPSULE | Freq: Every day | ORAL | Status: DC
  Administered 2020-08-16: 300 mg via ORAL
  Filled 2020-08-16: qty 1

## 2020-08-16 MED ORDER — CARVEDILOL 6.25 MG PO TABS
6.2500 mg | ORAL_TABLET | Freq: Two times a day (BID) | ORAL | Status: DC
  Administered 2020-08-16 – 2020-08-17 (×2): 6.25 mg via ORAL
  Filled 2020-08-16 (×2): qty 1

## 2020-08-16 MED ORDER — ASPIRIN EC 81 MG PO TBEC
81.0000 mg | DELAYED_RELEASE_TABLET | Freq: Every day | ORAL | Status: DC
  Administered 2020-08-17: 81 mg via ORAL
  Filled 2020-08-16: qty 1

## 2020-08-16 MED ORDER — FOLIC ACID 1 MG PO TABS
0.5000 mg | ORAL_TABLET | Freq: Every day | ORAL | Status: DC
  Administered 2020-08-17: 0.5 mg via ORAL
  Filled 2020-08-16: qty 1

## 2020-08-16 MED ORDER — PANTOPRAZOLE SODIUM 40 MG PO TBEC
40.0000 mg | DELAYED_RELEASE_TABLET | Freq: Every day | ORAL | Status: DC
  Administered 2020-08-17: 40 mg via ORAL
  Filled 2020-08-16: qty 1

## 2020-08-16 MED ORDER — HEPARIN (PORCINE) IN NACL 1000-0.9 UT/500ML-% IV SOLN
INTRAVENOUS | Status: AC
  Filled 2020-08-16: qty 1000

## 2020-08-16 MED ORDER — CYANOCOBALAMIN 1000 MCG/ML PO LIQD: 4.0000 | Freq: Every day | ORAL | Status: DC

## 2020-08-16 MED ORDER — NITROGLYCERIN 0.4 MG SL SUBL: 0.4000 mg | SUBLINGUAL_TABLET | SUBLINGUAL | Status: DC | PRN

## 2020-08-16 MED ORDER — ACETAMINOPHEN 325 MG PO TABS
650.0000 mg | ORAL_TABLET | ORAL | Status: DC | PRN
  Administered 2020-08-16: 650 mg via ORAL

## 2020-08-16 MED ORDER — SODIUM CHLORIDE 0.9 % WEIGHT BASED INFUSION: 1.0000 mL/kg/h | INTRAVENOUS | Status: AC

## 2020-08-16 MED ORDER — VERAPAMIL HCL 2.5 MG/ML IV SOLN
INTRAVENOUS | Status: AC
  Filled 2020-08-16: qty 2

## 2020-08-16 MED ORDER — VITAMIN B-6 25 MG PO TABS
25.0000 mg | ORAL_TABLET | Freq: Every day | ORAL | Status: DC
  Administered 2020-08-17: 25 mg via ORAL
  Filled 2020-08-16: qty 1

## 2020-08-16 MED ORDER — CYCLOBENZAPRINE HCL 10 MG PO TABS: 10.0000 mg | ORAL_TABLET | Freq: Three times a day (TID) | ORAL | Status: DC | PRN

## 2020-08-16 MED ORDER — EZETIMIBE 10 MG PO TABS
10.0000 mg | ORAL_TABLET | Freq: Every day | ORAL | Status: DC
  Administered 2020-08-16 – 2020-08-17 (×2): 10 mg via ORAL
  Filled 2020-08-16 (×2): qty 1

## 2020-08-16 MED ORDER — HEPARIN (PORCINE) IN NACL 1000-0.9 UT/500ML-% IV SOLN
INTRAVENOUS | Status: DC | PRN
  Administered 2020-08-16 (×2): 500 mL

## 2020-08-16 MED ORDER — BIOTIN 1 MG PO CAPS: 1.0000 | ORAL_CAPSULE | Freq: Every day | ORAL | Status: DC

## 2020-08-16 MED ORDER — FENTANYL CITRATE (PF) 100 MCG/2ML IJ SOLN
INTRAMUSCULAR | Status: AC
  Filled 2020-08-16: qty 2

## 2020-08-16 MED ORDER — SODIUM CHLORIDE 0.9 % IV SOLN: 250.0000 mL | INTRAVENOUS | Status: DC | PRN

## 2020-08-16 MED ORDER — HYDROCHLOROTHIAZIDE 12.5 MG PO CAPS
12.5000 mg | ORAL_CAPSULE | Freq: Every day | ORAL | Status: DC
  Administered 2020-08-17: 12.5 mg via ORAL
  Filled 2020-08-16: qty 1

## 2020-08-16 MED ORDER — HEPARIN SODIUM (PORCINE) 1000 UNIT/ML IJ SOLN
INTRAMUSCULAR | Status: DC | PRN
  Administered 2020-08-16: 3000 [IU] via INTRAVENOUS
  Administered 2020-08-16: 3500 [IU] via INTRAVENOUS

## 2020-08-16 MED ORDER — POTASSIUM CHLORIDE CRYS ER 10 MEQ PO TBCR
10.0000 meq | EXTENDED_RELEASE_TABLET | Freq: Two times a day (BID) | ORAL | Status: DC
  Administered 2020-08-16 – 2020-08-17 (×2): 10 meq via ORAL
  Filled 2020-08-16 (×2): qty 1

## 2020-08-16 MED ORDER — MIDAZOLAM HCL 2 MG/2ML IJ SOLN
INTRAMUSCULAR | Status: DC | PRN
  Administered 2020-08-16: 1 mg via INTRAVENOUS

## 2020-08-16 MED ORDER — LIDOCAINE HCL (PF) 1 % IJ SOLN
INTRAMUSCULAR | Status: AC
  Filled 2020-08-16: qty 30

## 2020-08-16 MED ORDER — CLONAZEPAM 0.5 MG PO TABS
1.0000 mg | ORAL_TABLET | Freq: Two times a day (BID) | ORAL | Status: DC
  Administered 2020-08-16 – 2020-08-17 (×2): 1 mg via ORAL
  Filled 2020-08-16 (×2): qty 2

## 2020-08-16 MED ORDER — CLOPIDOGREL BISULFATE 300 MG PO TABS
ORAL_TABLET | ORAL | Status: AC
  Filled 2020-08-16: qty 1

## 2020-08-16 MED ORDER — ISOSORBIDE MONONITRATE ER 30 MG PO TB24
30.0000 mg | ORAL_TABLET | Freq: Every day | ORAL | Status: DC
  Administered 2020-08-17: 30 mg via ORAL
  Filled 2020-08-16: qty 1

## 2020-08-16 MED ORDER — LIDOCAINE HCL (PF) 1 % IJ SOLN
INTRAMUSCULAR | Status: DC | PRN
  Administered 2020-08-16: 2 mL

## 2020-08-16 MED ORDER — VERAPAMIL HCL 2.5 MG/ML IV SOLN
INTRAVENOUS | Status: DC | PRN
  Administered 2020-08-16: 10 mL via INTRA_ARTERIAL

## 2020-08-16 MED ORDER — MAGNESIUM OXIDE -MG SUPPLEMENT 400 (240 MG) MG PO TABS
400.0000 mg | ORAL_TABLET | Freq: Two times a day (BID) | ORAL | Status: DC
  Administered 2020-08-16 – 2020-08-17 (×2): 400 mg via ORAL
  Filled 2020-08-16 (×2): qty 1

## 2020-08-16 MED ORDER — IOHEXOL 350 MG/ML SOLN
INTRAVENOUS | Status: DC | PRN
  Administered 2020-08-16: 165 mL

## 2020-08-16 MED ORDER — CLOPIDOGREL BISULFATE 300 MG PO TABS
ORAL_TABLET | ORAL | Status: DC | PRN
  Administered 2020-08-16: 300 mg via ORAL

## 2020-08-16 MED ORDER — SODIUM CHLORIDE 0.9% FLUSH: 3.0000 mL | INTRAVENOUS | Status: DC | PRN

## 2020-08-16 SURGICAL SUPPLY — 21 items
BALLN SAPPHIRE 2.5X12 (BALLOONS) ×2
BALLN SAPPHIRE ~~LOC~~ 3.5X12 (BALLOONS) ×1 IMPLANT
BALLOON SAPPHIRE 2.5X12 (BALLOONS) IMPLANT
CATH DRAGONFLY OPSTAR (CATHETERS) ×1 IMPLANT
CATH INFINITI 5FR JK (CATHETERS) ×1 IMPLANT
CATH INFINITI JR4 5F (CATHETERS) ×1 IMPLANT
CATH LAUNCHER 6FR EBU3.5 (CATHETERS) ×1 IMPLANT
DEVICE RAD COMP TR BAND LRG (VASCULAR PRODUCTS) ×1 IMPLANT
GLIDESHEATH SLEND SS 6F .021 (SHEATH) ×1 IMPLANT
GUIDEWIRE INQWIRE 1.5J.035X260 (WIRE) IMPLANT
GUIDEWIRE PRESSURE X 175 (WIRE) ×1 IMPLANT
INQWIRE 1.5J .035X260CM (WIRE) ×2
KIT ENCORE 26 ADVANTAGE (KITS) ×1 IMPLANT
KIT HEART LEFT (KITS) ×2 IMPLANT
PACK CARDIAC CATHETERIZATION (CUSTOM PROCEDURE TRAY) ×2 IMPLANT
STENT ONYX FRONTIER 3.0X18 (Permanent Stent) ×1 IMPLANT
TRANSDUCER W/STOPCOCK (MISCELLANEOUS) ×2 IMPLANT
TUBING ART PRESS 72  MALE/FEM (TUBING) ×2
TUBING ART PRESS 72 MALE/FEM (TUBING) IMPLANT
TUBING CIL FLEX 10 FLL-RA (TUBING) ×2 IMPLANT
WIRE RUNTHROUGH .014X180CM (WIRE) ×1 IMPLANT

## 2020-08-16 NOTE — H&P (Signed)
Cardiology Admission History and Physical:   Patient ID: Makayla Fischer MRN: [id number]; DOB: April 26, 1960   Admission date: 08/16/2020  PCP:  Jeanie Sewer, NP   [facility] Providers Cardiologist:  Jenne Campus, MD        Chief Complaint: Chest pain  Patient Profile:   Makayla Fischer is a 61 y.o. female with history of coronary artery disease status post PCI who is being seen 08/16/2020 for the evaluation of chest pain and suspected unstable angina.  History of Present Illness:   Makayla Fischer is a 60 year old female with known history of coronary artery disease.  She reports multiple stents in the past most recently in 2020 at [facility], anxiety, tobacco use, COPD, essential hypertension and hyperlipidemia. She reports significant stress and anxiety recently.  She presented to [facility] with intermittent chest pain and tightness but mostly triggered by stress that can happen with exertion as well.  She has chronic exertional dyspnea related to her lung disease.  Her EKG there showed no ischemic changes.  Troponin was normal.  Her lab showed normal renal function but hemoglobin was 8.7.  She is known to be chronically anemic and reports inability to tolerate oral iron.  She denies active bleeding. Given the patient's symptoms and previous cardiac history, she was transferred for left heart catheterization possible PCI.   Past Medical History:  Diagnosis Date   Adnexal mass 09/06/2019   Anginal pain (HCC)    Anxiety    h/o panic attacks    Arthritis    all over, back, knees    CAD (coronary artery disease)    Chronic coronary artery disease 08/10/2017   Chronic pain    Coronary disease PTCA and stenting with drug-eluting stent to circumflex artery in August 2020 08/10/2017   Daily consumption of alcohol 08/10/2017   Depression    Dyslipidemia 04/28/2019   Essential hypertension 08/10/2017   Fatty liver    by 01/15/11 CT scan   Fibromyalgia    GERD  (gastroesophageal reflux disease)    Hyperlipidemia    Hypertension    sees Dr. Posey Rea, stress test in 01/2011   Hypomagnesemia 08/10/2017   Hyponatremia 08/10/2017   Incisional hernia    with obstruction   Myocardial infarction ([place]) 02/2008   Neuromuscular disorder ([place])    neuropathy   Osteoporosis    Shortness of breath    Status post insertion of drug eluting coronary artery stent 09/15/2018   Substance abuse ([place])    drugs and alcohol   Tobacco abuse 08/10/2017   Type II diabetes mellitus, well controlled ([place]) 05/11/2438   Uncomplicated opioid dependence ([place]) 08/10/2017   Unstable angina ([place]) 09/15/2018   Varicose veins of leg with complications 01/30/7251   Varicose veins of lower extremities with other complications 6/64/4034   Varicose veins of lower extremity 10/12/2013    Past Surgical History:  Procedure Laterality Date   CARDIAC CATHETERIZATION     02/2008- [facility], x2 stents    [place] AND ADENOIDECTOMY     TUBAL LIGATION     VENTRAL HERNIA REPAIR  07/12/2011   Procedure: LAPAROSCOPIC VENTRAL HERNIA;  Surgeon: Adin Hector, MD;  Location: [place];  Service: General;  Laterality: N/A;  laparoscopic repair incisional hernia with mesh     Medications Prior to Admission: Prior to Admission medications   Medication Sig Start Date End Date Taking? Authorizing Provider  albuterol (  VENTOLIN HFA) 108 (90 Base) MCG/ACT inhaler Inhale 2 puffs into the lungs every 6 (six) hours as needed for wheezing or shortness of breath.    [provider]  Ascorbic Acid (VITAMIN C) 500 MG CAPS Take 1 capsule by mouth daily.    [provider]  aspirin 81 MG tablet Take 81 mg by mouth daily.    [provider]  atorvastatin (LIPITOR) 80 MG tablet TAKE 1 TABLET(80 MG) BY MOUTH DAILY Patient taking differently: Take 80 mg by mouth daily. 01/24/20   Park Liter, MD  Biotin 1 MG CAPS Take 1  tablet by mouth daily.    [provider]  Buprenorphine HCl-Naloxone HCl 8-2 MG FILM Place 1 Film under the tongue 2 (two) times daily.    [provider]  carvedilol (COREG) 6.25 MG tablet Take 6.25 mg by mouth 2 (two) times daily with a meal.    [provider]  clonazePAM (KLONOPIN) 0.5 MG tablet Take 1 mg by mouth 2 (two) times daily. For anxiety    [provider]  clopidogrel (PLAVIX) 75 MG tablet TAKE 1 TABLET(75 MG) BY MOUTH DAILY Patient taking differently: Take 75 mg by mouth daily. 03/24/20   Park Liter, MD  Cyanocobalamin 1000 MCG/ML LIQD Take 4 tablets by mouth daily.    [provider]  cyclobenzaprine (FLEXERIL) 10 MG tablet Take 10 mg by mouth 3 (three) times daily as needed for muscle spasms. For muscle pain    [provider]  ezetimibe (ZETIA) 10 MG tablet TAKE 1 TABLET(10 MG) BY MOUTH DAILY 07/19/20   Park Liter, MD  folic acid (FOLVITE) 161 MCG tablet Take 1 tablet by mouth daily.    [provider]  gabapentin (NEURONTIN) 600 MG tablet Take 240 mg by mouth daily. 03/22/19   [provider]  hydrochlorothiazide (HYDRODIURIL) 12.5 MG tablet Take 12.5 mg by mouth daily. Take 12.5 mg by mouth daily    [provider]  isosorbide mononitrate (IMDUR) 60 MG 24 hr tablet Take 30 mg by mouth daily.  09/27/19   [provider]  lisinopril (PRINIVIL,ZESTRIL) 20 MG tablet Take 10 mg by mouth in the morning and at bedtime.    [provider]  magnesium oxide (MAG-OX) 400 MG tablet Take 1 tablet by mouth 2 (two) times daily. 08/12/17   [provider]  metFORMIN (GLUMETZA) 500 MG (MOD) 24 hr tablet Take 500 mg by mouth as needed (If glucose elevate).    [provider]  nitroGLYCERIN (NITROSTAT) 0.4 MG SL tablet TAKE 1 TABLET UNDER THE TONGUE AS NEEDED FOR CHEST PAIN. CAN TAKE UP TO 3 PILLS 5 MINUTES APART. CALL 911 IF ALL TAKEN AND NO RELIEF. MAX 3  TABLETS Patient taking differently: Place 0.4 mg under the tongue every 5 (five) minutes as needed for chest pain. 08/16/19   Park Liter, MD  pantoprazole (PROTONIX) 40 MG tablet Take 40 mg by mouth daily. 11/24/19   [provider]  potassium gluconate 595 (99 K) MG TABS tablet Take 595 mg by mouth 2 (two) times daily.    [provider]  vitamin B-6 (PYRIDOXINE) 25 MG tablet Take 25 mg by mouth daily.    [provider]     Allergies:    Allergies  Allergen Reactions   Septra [Bactrim] Shortness Of Breath and Itching   Sulfamethoxazole-Trimethoprim Itching, Shortness Of Breath, Anaphylaxis and Other (See Comments)    Social History:   Social History  Socioeconomic History   Marital status: Married    Spouse name: Not on file   Number of children: Not on file   Years of education: Not on file   Highest education level: Not on file  Occupational History   Not on file  Tobacco Use   Smoking status: Every Day    Packs/day: 1.00    Years: 28.00    Pack years: 28.00    Types: Cigarettes   Smokeless tobacco: Never  Vaping Use   Vaping Use: Never used  Substance and Sexual Activity   Alcohol use: Yes    Alcohol/week: 6.0 standard drinks    Types: 6 Cans of beer per week    Comment: 6 pack of beer / week   Drug use: No    Types: Heroin    Comment: 1.5 yr. use of Heroin- 1990's    Sexual activity: Not Currently  Other Topics Concern   Not on file  Social History Narrative   Not on file   Social Determinants of Health   Financial Resource Strain: Not on file  Food Insecurity: Not on file  Transportation Needs: Not on file  Physical Activity: Not on file  Stress: Not on file  Social Connections: Not on file  Intimate Partner Violence: Not on file    Family History:   The patient's family history includes Cancer in her father; Diabetes in her mother; Heart disease in her mother; Hyperlipidemia in her mother; Hypertension in her  mother. There is no history of Anesthesia problems.    ROS:  Please see the history of present illness.  All other ROS reviewed and negative.     Physical Exam/Data:   Vitals:   08/16/20 1443  SpO2: 97%   No intake or output data in the 24 hours ending 08/16/20 1457 Last 3 Weights 05/30/2020 12/31/2019 07/29/2019  Weight (lbs) 147 lb 150 lb 156 lb 6.4 oz  Weight (kg) 66.679 kg 68.04 kg 70.943 kg     There is no height or weight on file to calculate BMI.  General:  Well nourished, well developed, in no acute distress HEENT: normal Lymph: no adenopathy Neck: no JVD Endocrine:  No thryomegaly Vascular: No carotid bruits; FA pulses 2+ bilaterally without bruits  Cardiac:  normal S1, S2; RRR; no murmur  Lungs:  clear to auscultation bilaterally, no wheezing, rhonchi or rales  Abd: soft, nontender, no hepatomegaly  Ext: no edema Musculoskeletal:  No deformities, BUE and BLE strength normal and equal Skin: warm and dry  Neuro:  CNs 2-12 intact, no focal abnormalities noted Psych:  Normal affect       Laboratory Data:  High Sensitivity Troponin:  No results for input(s): TROPONINIHS in the last 720 hours.    ChemistryNo results for input(s): NA, K, CL, CO2, GLUCOSE, BUN, CREATININE, CALCIUM, GFRNONAA, GFRAA, ANIONGAP in the last 168 hours.  No results for input(s): PROT, ALBUMIN, AST, ALT, ALKPHOS, BILITOT in the last 168 hours. HematologyNo results for input(s): WBC, RBC, HGB, HCT, MCV, MCH, MCHC, RDW, PLT in the last 168 hours. BNPNo results for input(s): BNP, PROBNP in the last 168 hours.  DDimer No results for input(s): DDIMER in the last 168 hours.   Radiology/Studies:  No results found.   Assessment and Plan:   Coronary artery disease involving native coronary arteries with possible unstable angina: The patient has known history of coronary artery disease with previous PCI to the left circumflex.  She now presents with intermittent chest pain both  at rest and with  exertion and mostly triggered by stress and anxiety.  No evidence of myocardial infarction by cardiac enzymes.  The patient is here for left heart catheterization and possible PCI.  The procedure has been discussed with the patient and she is aware of risks and benefits.  She is already on dual antiplatelet therapy. Tobacco use: The patient wants to quit smoking and discussed the importance of doing so. Chronic anemia: No active bleeding but she has been chronically anemic.  Recommend evaluation of this in the near future especially that she is on dual antiplatelet therapy. Hyperlipidemia: Continue treatment with a statin with a target LDL of less than 70.    Severity of Illness: The appropriate patient status for this patient is OBSERVATION. Observation status is judged to be reasonable and necessary in order to provide the required intensity of service to ensure the patient's safety. The patient's presenting symptoms, physical exam findings, and initial radiographic and laboratory data in the context of their medical condition is felt to place them at decreased risk for further clinical deterioration. Furthermore, it is anticipated that the patient will be medically stable for discharge from the hospital within 2 midnights of admission. The following factors support the patient status of observation.   " The patient's presenting symptoms include chest pain at rest with known history of coronary artery disease. " The physical exam findings include . " The initial radiographic and laboratory data are normal troponin with chronic anemia..   For questions or updates, please contact [place] Please consult [email or web address] for contact info under     Signed, Kathlyn Sacramento, MD  08/16/2020 2:57 PM

## 2020-08-17 ENCOUNTER — Encounter (HOSPITAL_COMMUNITY): Payer: Self-pay | Admitting: Cardiovascular Disease

## 2020-08-17 ENCOUNTER — Telehealth: Payer: Self-pay

## 2020-08-17 DIAGNOSIS — E119 Type 2 diabetes mellitus without complications: Secondary | ICD-10-CM | POA: Diagnosis not present

## 2020-08-17 DIAGNOSIS — J449 Chronic obstructive pulmonary disease, unspecified: Secondary | ICD-10-CM | POA: Diagnosis not present

## 2020-08-17 DIAGNOSIS — I25110 Atherosclerotic heart disease of native coronary artery with unstable angina pectoris: Secondary | ICD-10-CM | POA: Diagnosis not present

## 2020-08-17 DIAGNOSIS — Z7984 Long term (current) use of oral hypoglycemic drugs: Secondary | ICD-10-CM | POA: Diagnosis not present

## 2020-08-17 DIAGNOSIS — Z79899 Other long term (current) drug therapy: Secondary | ICD-10-CM | POA: Diagnosis not present

## 2020-08-17 DIAGNOSIS — Z7982 Long term (current) use of aspirin: Secondary | ICD-10-CM | POA: Diagnosis not present

## 2020-08-17 DIAGNOSIS — I10 Essential (primary) hypertension: Secondary | ICD-10-CM | POA: Diagnosis not present

## 2020-08-17 DIAGNOSIS — F17210 Nicotine dependence, cigarettes, uncomplicated: Secondary | ICD-10-CM | POA: Diagnosis not present

## 2020-08-17 LAB — CBC
HCT: 28.8 % — ABNORMAL LOW (ref 36.0–46.0)
Hemoglobin: 8.6 g/dL — ABNORMAL LOW (ref 12.0–15.0)
MCH: 21.7 pg — ABNORMAL LOW (ref 26.0–34.0)
MCHC: 29.9 g/dL — ABNORMAL LOW (ref 30.0–36.0)
MCV: 72.7 fL — ABNORMAL LOW (ref 80.0–100.0)
Platelets: 172 10*3/uL (ref 150–400)
RBC: 3.96 MIL/uL (ref 3.87–5.11)
RDW: 17.2 % — ABNORMAL HIGH (ref 11.5–15.5)
WBC: 4 10*3/uL (ref 4.0–10.5)
nRBC: 0 % (ref 0.0–0.2)

## 2020-08-17 LAB — BASIC METABOLIC PANEL
Anion gap: 8 (ref 5–15)
BUN: 11 mg/dL (ref 8–23)
CO2: 28 mmol/L (ref 22–32)
Calcium: 8.8 mg/dL — ABNORMAL LOW (ref 8.9–10.3)
Chloride: 98 mmol/L (ref 98–111)
Creatinine, Ser: 0.59 mg/dL (ref 0.44–1.00)
GFR, Estimated: >60 mL/min (ref >60)
Glucose, Bld: 122 mg/dL — ABNORMAL HIGH (ref 70–99)
Potassium: 4.2 mmol/L (ref 3.5–5.1)
Sodium: 134 mmol/L — ABNORMAL LOW (ref 135–145)

## 2020-08-17 LAB — POCT ACTIVATED CLOTTING TIME
Activated Clotting Time: 323 seconds
Activated Clotting Time: 451 seconds

## 2020-08-17 MED ORDER — LISINOPRIL 10 MG PO TABS
10.0000 mg | ORAL_TABLET | Freq: Every day | ORAL | 2 refills | Status: DC
Start: 2020-08-18 — End: 2021-03-19

## 2020-08-17 MED FILL — Lidocaine HCl Local Preservative Free (PF) Inj 1%: INTRAMUSCULAR | Qty: 30 | Status: AC

## 2020-08-17 NOTE — Telephone Encounter (Signed)
Tried calling patient. No answer and no voicemail set up for me to leave a message. 

## 2020-08-17 NOTE — Telephone Encounter (Signed)
-----   Message from Park Liter, MD sent at 08/17/2020  1:37 PM EDT ----- Regarding: RE: appointment Double book and I will be able to see her please ----- Message ----- From: Gita Kudo, RN Sent: 08/17/2020  10:18 AM EDT To: Park Liter, MD Subject: FW: appointment                                Please advise when you would like to see her. You do not have any openings in the next several weeks.  ----- Message ----- From: Tommie Raymond, NP Sent: 08/17/2020  10:15 AM EDT To: Rebeca Alert [facility]/Hp Triage, [facility]p Scheduling, # Subject: appointment                                    This is a patient of Dr. Fraser Din. She was admitted for chest pain and underwent LHC with PCI placement. She will need a follow up in the next several weeks please. She will be discharged today. Please call and inform the patient of date and time of appointment.   Thank you

## 2020-08-17 NOTE — Progress Notes (Signed)
CARDIAC REHAB PHASE I   Offered to walk with pt. Pt states independent ambulation without difficulty. Denies CP, SOB, or dizziness. Pt educated on importance of ASA and Plavix. Pt given heart healthy diet and smoking cessation tip sheet. Reviewed site care, restrictions, and exercise guidelines. Spoke at length about stress and stress management. Will refer to CRP II [place].  [id number] [name], RN BSN 08/17/2020 9:48 AM

## 2020-08-17 NOTE — Progress Notes (Addendum)
Progress Note  Patient Name: Makayla Fischer Date of Encounter: 08/17/2020  Primary Cardiologist: Dr. Fletcher Anon, MD  Subjective   Pt presented for [facility] for recurrent chest pain>>>LAD disease with PCI placement. Has residual RCA disease for medical management.   Inpatient Medications    Scheduled Meds:  aspirin EC  81 mg Oral Daily   atorvastatin  80 mg Oral Daily   carvedilol  6.25 mg Oral BID WC   clonazePAM  1 mg Oral BID   clopidogrel  75 mg Oral Daily   ezetimibe  10 mg Oral Daily   feeding supplement  237 mL Oral BID BM   folic acid  0.5 mg Oral Daily   gabapentin  300 mg Oral QHS   hydrochlorothiazide  12.5 mg Oral Daily   isosorbide mononitrate  30 mg Oral Daily   lisinopril  10 mg Oral Daily   magnesium oxide  400 mg Oral BID   pantoprazole  40 mg Oral Daily   potassium chloride  10 mEq Oral BID   sodium chloride flush  3 mL Intravenous Q12H   vitamin B-6  25 mg Oral Daily   Continuous Infusions:  sodium chloride     PRN Meds: sodium chloride, acetaminophen, albuterol, cyclobenzaprine, nitroGLYCERIN, ondansetron (ZOFRAN) IV, sodium chloride flush   Vital Signs    Vitals:   08/16/20 2210 08/16/20 2357 08/17/20 0022 08/17/20 0441  BP: 118/61 (!) 107/56  (!) 111/58  Pulse: 70 62 73 (!) 54  Resp: 17 15 17 14   Temp:  98.7 F (37.1 C)  98.7 F (37.1 C)  TempSrc:  Oral  Oral  SpO2: 94% (!) 88% 99% 98%  Weight:      Height:        Intake/Output Summary (Last 24 hours) at 08/17/2020 0723 Last data filed at 08/17/2020 0304 Gross per 24 hour  Intake 604.76 ml  Output --  Net 604.76 ml   Filed Weights   08/16/20 1620  Weight: 66.7 kg    Physical Exam   General: Well developed, well nourished, NAD Neck: Negative for carotid bruits. No JVD Lungs:Clear to ausculation bilaterally. No wheezes, rales, or rhonchi. Breathing is unlabored. Cardiovascular: RRR with S1 S2. No murmurs Abdomen: Soft, non-tender, non-distended. No obvious abdominal  masses. Extremities: No edema.  Neuro: Alert and oriented. No focal deficits. No facial asymmetry. MAE spontaneously. Psych: Responds to questions appropriately with normal affect.    Labs    Chemistry Recent Labs  Lab 08/17/20 0211  NA 134*  K 4.2  CL 98  CO2 28  GLUCOSE 122*  BUN 11  CREATININE 0.59  CALCIUM 8.8*  GFRNONAA >60  ANIONGAP 8     Hematology Recent Labs  Lab 08/17/20 0211  WBC 4.0  RBC 3.96  HGB 8.6*  HCT 28.8*  MCV 72.7*  MCH 21.7*  MCHC 29.9*  RDW 17.2*  PLT 172    Cardiac EnzymesNo results for input(s): TROPONINI in the last 168 hours. No results for input(s): TROPIPOC in the last 168 hours.   BNPNo results for input(s): BNP, PROBNP in the last 168 hours.   DDimer No results for input(s): DDIMER in the last 168 hours.   Radiology    CARDIAC CATHETERIZATION  Addendum Date: 08/16/2020     Colon [place] Cx to Mid Cx lesion is 20% stenosed.   Dist LAD lesion is 70% stenosed.   LPAV lesion is 30% stenosed.   Prox LAD lesion is 75% stenosed.   Prox RCA  to Mid RCA lesion is 60% stenosed.   Prox LAD to Mid LAD lesion is 30% stenosed.   A drug-eluting stent was successfully placed using a STENT ONYX FRONTIER 3.0X18.   Post intervention, there is a 0% residual stenosis.   The left ventricular systolic function is normal.   LV end diastolic pressure is normal.   The left ventricular ejection fraction is 55-65% by visual estimate. 1.  Significant underlying three-vessel coronary artery disease with patent stents in the LAD and left circumflex.  There is 75% stenosis proximal to the LAD stent that was significant by pressure wire evaluation.  In addition, there is a 60% stenosis in the mid RCA.  The coronary arteries are codominant. 2.  Normal LV systolic function normal left ventricular end-diastolic pressure 3.  Successful OCT guided angioplasty and drug-eluting stent placement to the proximal LAD overlapping the previous stent. Recommendations: Continue dual antiplatelet  therapy indefinitely. Aggressive treatment of risk factors. Treat the right coronary artery medically for now. Possible discharge home tomorrow.  Result Date: 08/16/2020 Formatting of this result is different from the original.   Ost Cx to Mid Cx lesion is 20% stenosed.   Dist LAD lesion is 70% stenosed.   LPAV lesion is 30% stenosed.   Prox LAD lesion is 75% stenosed.   Prox RCA to Mid RCA lesion is 60% stenosed.   Prox LAD to Mid LAD lesion is 30% stenosed.   A drug-eluting stent was successfully placed using a STENT ONYX FRONTIER 3.0X18.   Post intervention, there is a 0% residual stenosis.   The left ventricular systolic function is normal.   LV end diastolic pressure is normal.   The left ventricular ejection fraction is 55-65% by visual estimate. 1.  Significant underlying three-vessel coronary artery disease with patent stents in the LAD and left circumflex.  There is 75% stenosis proximal to the LAD stent that was significant by pressure wire evaluation.  In addition, there is a 60% stenosis in the mid LAD.  The coronary arteries are codominant. 2.  Normal LV systolic function normal left ventricular end-diastolic pressure 3.  Successful OCT guided angioplasty and drug-eluting stent placement to the proximal LAD overlapping the previous stent. Recommendations: Continue dual antiplatelet therapy indefinitely. Aggressive treatment of risk factors. Treat the right coronary artery medically for now. Possible discharge home tomorrow.    Telemetry    08/18/18 NSR HR 60's  - Personally Reviewed  ECG    No new tracing as of 08/17/20 - Personally Reviewed  Cardiac Studies   LHC 08/17/18:    Ost Cx to Mid Cx lesion is 20% stenosed.   Dist LAD lesion is 70% stenosed.   LPAV lesion is 30% stenosed.   Prox LAD lesion is 75% stenosed.   Prox RCA to Mid RCA lesion is 60% stenosed.   Prox LAD to Mid LAD lesion is 30% stenosed.   A drug-eluting stent was successfully placed using a STENT ONYX FRONTIER  3.0X18.   Post intervention, there is a 0% residual stenosis.   The left ventricular systolic function is normal.   LV end diastolic pressure is normal.   The left ventricular ejection fraction is 55-65% by visual estimate.   1.  Significant underlying three-vessel coronary artery disease with patent stents in the LAD and left circumflex.  There is 75% stenosis proximal to the LAD stent that was significant by pressure wire evaluation.  In addition, there is a 60% stenosis in the mid RCA.  The coronary  arteries are codominant. 2.  Normal LV systolic function normal left ventricular end-diastolic pressure 3.  Successful OCT guided angioplasty and drug-eluting stent placement to the proximal LAD overlapping the previous stent.   Recommendations: Continue dual antiplatelet therapy indefinitely. Aggressive treatment of risk factors. Treat the right coronary artery medically for now. Possible discharge home tomorrow.    Diagnostic Dominance: Co-dominant    Intervention     Patient Profile     60 y.o. female with history of coronary artery disease status post PCI who is being seen 08/16/2020 for the evaluation of chest pain and suspected unstable angina.  Assessment & Plan    1. Unstable angina with known CAD: -Pt with a hx of CAD with previous PCI to the LCx who presented with intermittent chest pain both at rest and with exertion and mostly triggered by stress and anxiety. HST have been negative. Plan was for LHC>>>this showed significant underlying three-vessel coronary artery disease with patent stents in the LAD and left circumflex.  There was a 75% stenosis proximal to the LAD stent that was significant by pressure wire evaluation as well as a 60% stenosis in the mid RCA.   -Successful OCT guided angioplasty and drug-eluting stent placement to the proximal LAD overlapping the previous stent. -Recommendations are to continue with DAPT indefinitely -Consider treatment of the RCA if  recurrent symptoms however will focus on medical management for now  -Cath site stable    -Creatinine stable at 0.59  2. Tobacco use: -Cessation encouraged  -Motivated to quit -Offered nicotine patches   3. Acute on chronic anemia: -Baseline Hb noted to be int he 11-13 range however patient reports more recent Hb in the 8-9 range.  -Plans to follow with PCP>>>hopes for referral to hematology -Intolerant to iron   4. HLD: -Last LDL, 81 from 12/31/2019 -Repeat lipid panel in AM  -Continue statin therapy  -Not at goal of <70   Signed, Kathyrn Drown NP-C [facility] Pager: [phone] 08/17/2020, 7:23 AM     For questions or updates, please contact   Please consult [email or web address] for contact info under Cardiology/STEMI.  Patient seen and examined with Kathyrn Drown, NP-C.  Agree as above, with the following exceptions and changes as noted below.  She feels well today with only mild chest discomfort.  We discussed increasing the dose of her Imdur however she notes that when it was increased to 60 mg in the past she had lightheadedness.  I have encouraged her to take sublingual nitroglycerin as needed for chest discomfort if beneficial and to call our office or present to the ED with any concerning chest pain that is not responsive to nitro.  Gen: NAD, CV: RRR, no murmurs, Lungs: clear, Abd: soft, Extrem: Warm, well perfused, no edema, Neuro/Psych: alert and oriented x 3, normal mood and affect. All available labs, radiology testing, previous records reviewed.  Radial cath site appears stable.  Stable for hospital discharge today, patient is agreeable.  Elouise Munroe, MD 08/17/20 10:05 AM

## 2020-08-17 NOTE — Plan of Care (Signed)

## 2020-08-17 NOTE — Discharge Summary (Signed)
Discharge Summary    Patient ID: Makayla Fischer MRN: [id number]; DOB: 05-06-1960  Admit date: 08/16/2020 Discharge date: 08/17/2020  PCP:  Jeanie Sewer, NP   [facility] Providers Cardiologist:  Jenne Campus, MD   {   Discharge Diagnoses    Active Problems:   Coronary disease PTCA and stenting with drug-eluting stent to circumflex artery in August 2020   Smoking   Unstable angina ([place])   Hypertension   Hyperlipidemia   CAD (coronary artery disease)  Diagnostic Studies/Procedures    LHC 08/17/18:     Ost Cx to Mid Cx lesion is 20% stenosed.   Dist LAD lesion is 70% stenosed.   LPAV lesion is 30% stenosed.   Prox LAD lesion is 75% stenosed.   Prox RCA to Mid RCA lesion is 60% stenosed.   Prox LAD to Mid LAD lesion is 30% stenosed.   A drug-eluting stent was successfully placed using a STENT ONYX FRONTIER 3.0X18.   Post intervention, there is a 0% residual stenosis.   The left ventricular systolic function is normal.   LV end diastolic pressure is normal.   The left ventricular ejection fraction is 55-65% by visual estimate.   1.  Significant underlying three-vessel coronary artery disease with patent stents in the LAD and left circumflex.  There is 75% stenosis proximal to the LAD stent that was significant by pressure wire evaluation.  In addition, there is a 60% stenosis in the mid RCA.  The coronary arteries are codominant. 2.  Normal LV systolic function normal left ventricular end-diastolic pressure 3.  Successful OCT guided angioplasty and drug-eluting stent placement to the proximal LAD overlapping the previous stent.   Recommendations: Continue dual antiplatelet therapy indefinitely. Aggressive treatment of risk factors. Treat the right coronary artery medically for now. Possible discharge home tomorrow.     Diagnostic Dominance: Co-dominant      Intervention         _____________   History of Present Illness     Makayla Fischer  is a 60 y.o. female with with history of coronary artery disease status post PCI who is being seen 08/16/2020 for the evaluation of chest pain and suspected unstable angina.  Pt with a hx of CAD with previous PCI to the LCx who presented with intermittent chest pain both at rest and with exertion and mostly triggered by stress and anxiety. HST have been negative.   Hospital Course     Given her symptoms it was recommended that the patient undergo LHC performed on 08/17/18 that showed significant underlying three-vessel coronary artery disease with patent stents in the LAD and left circumflex.  There was a 75% stenosis proximal to the LAD stent that was significant by pressure wire evaluation as well as a 60% stenosis in the mid RCA.   Successful OCT guided angioplasty and drug-eluting stent placement to the proximal LAD overlapping the previous stent. Recommendations are to continue with DAPT indefinitely. Cath site has remained stable with no s/s of bleeding or hematoma. She has ambulated with cardiac rehab without difficulty.   Hospital problems as below:  Unstable angina with known CAD: -Pt with a hx of CAD with previous PCI to the LCx who presented with intermittent chest pain both at rest and with exertion and mostly triggered by stress and anxiety. HST have been negative. Plan was for LHC>>>this showed significant underlying three-vessel coronary artery disease with patent stents in the LAD and left circumflex.  There was a 75% stenosis proximal  to the LAD stent that was significant by pressure wire evaluation as well as a 60% stenosis in the mid RCA.   -Successful OCT guided angioplasty and drug-eluting stent placement to the proximal LAD overlapping the previous stent. -Recommendations are to continue with DAPT indefinitely -Consider treatment of the RCA if recurrent symptoms however will focus on medical management for now -Cath site stable    -Creatinine stable at 0.59   Tobacco  use: -Cessation encouraged -Motivated to quit -Offered nicotine patches   Acute on chronic anemia: -Baseline Hb noted to be int he 11-13 range however patient reports more recent Hb in the 8-9 range. -Plans to follow with PCP>>>hopes for referral to hematology -Intolerant to iron    HLD: -Last LDL, 81 from 12/31/2019 -Repeat lipid panel in AM -Continue statin therapy -Not at goal of <70  Consultants: None    The patient was seen and examined by Dr. Margaretann Loveless who feel that she is stable and ready for discharge today. The office will call and schedule follow up appointment with Dr. Agustin Cree or APP.  Did the patient have an acute coronary syndrome (MI, NSTEMI, STEMI, etc) this admission?:  No                               Did the patient have a percutaneous coronary intervention (stent / angioplasty)?:  Yes.     Cath/PCI Registry Performance & Quality Measures: Aspirin prescribed? - Yes ADP Receptor Inhibitor (Plavix/Clopidogrel, Brilinta/Ticagrelor or Effient/Prasugrel) prescribed (includes medically managed patients)? - Yes High Intensity Statin (Lipitor 40-80mg  or Crestor 20-40mg ) prescribed? - Yes For EF <40%, was ACEI/ARB prescribed? - Not Applicable (EF >/= 19%) For EF <40%, Aldosterone Antagonist (Spironolactone or Eplerenone) prescribed? - Not Applicable (EF >/= 62%) Cardiac Rehab Phase II ordered? - Yes   ___________  Discharge Vitals Blood pressure (!) 111/58, pulse (!) 54, temperature 98.7 F (37.1 C), temperature source Oral, resp. rate 14, height 5' 4.5" (1.638 m), weight 66.7 kg, SpO2 98 %.  Filed Weights   08/16/20 1620  Weight: 66.7 kg   Labs & Radiologic Studies    CBC Recent Labs    08/17/20 0211  WBC 4.0  HGB 8.6*  HCT 28.8*  MCV 72.7*  PLT 229   Basic Metabolic Panel Recent Labs    08/17/20 0211  NA 134*  K 4.2  CL 98  CO2 28  GLUCOSE 122*  BUN 11  CREATININE 0.59  CALCIUM 8.8*   Liver Function Tests No results for input(s): AST, ALT,  ALKPHOS, BILITOT, PROT, ALBUMIN in the last 72 hours. No results for input(s): LIPASE, AMYLASE in the last 72 hours. High Sensitivity Troponin:   No results for input(s): TROPONINIHS in the last 720 hours.  BNP Invalid input(s): POCBNP D-Dimer No results for input(s): DDIMER in the last 72 hours. Hemoglobin A1C No results for input(s): HGBA1C in the last 72 hours. Fasting Lipid Panel No results for input(s): CHOL, HDL, LDLCALC, TRIG, CHOLHDL, LDLDIRECT in the last 72 hours. Thyroid Function Tests No results for input(s): TSH, T4TOTAL, T3FREE, THYROIDAB in the last 72 hours.  Invalid input(s): FREET3 _____________  CARDIAC CATHETERIZATION  Addendum Date: 08/16/2020     Ost Cx to Mid Cx lesion is 20% stenosed.   Dist LAD lesion is 70% stenosed.   LPAV lesion is 30% stenosed.   Prox LAD lesion is 75% stenosed.   Prox RCA to Mid RCA lesion is 60% stenosed.  Prox LAD to Mid LAD lesion is 30% stenosed.   A drug-eluting stent was successfully placed using a STENT ONYX FRONTIER 3.0X18.   Post intervention, there is a 0% residual stenosis.   The left ventricular systolic function is normal.   LV end diastolic pressure is normal.   The left ventricular ejection fraction is 55-65% by visual estimate. 1.  Significant underlying three-vessel coronary artery disease with patent stents in the LAD and left circumflex.  There is 75% stenosis proximal to the LAD stent that was significant by pressure wire evaluation.  In addition, there is a 60% stenosis in the mid RCA.  The coronary arteries are codominant. 2.  Normal LV systolic function normal left ventricular end-diastolic pressure 3.  Successful OCT guided angioplasty and drug-eluting stent placement to the proximal LAD overlapping the previous stent. Recommendations: Continue dual antiplatelet therapy indefinitely. Aggressive treatment of risk factors. Treat the right coronary artery medically for now. Possible discharge home tomorrow.  Result Date:  08/16/2020 Formatting of this result is different from the original.   Ost Cx to Mid Cx lesion is 20% stenosed.   Dist LAD lesion is 70% stenosed.   LPAV lesion is 30% stenosed.   Prox LAD lesion is 75% stenosed.   Prox RCA to Mid RCA lesion is 60% stenosed.   Prox LAD to Mid LAD lesion is 30% stenosed.   A drug-eluting stent was successfully placed using a STENT ONYX FRONTIER 3.0X18.   Post intervention, there is a 0% residual stenosis.   The left ventricular systolic function is normal.   LV end diastolic pressure is normal.   The left ventricular ejection fraction is 55-65% by visual estimate. 1.  Significant underlying three-vessel coronary artery disease with patent stents in the LAD and left circumflex.  There is 75% stenosis proximal to the LAD stent that was significant by pressure wire evaluation.  In addition, there is a 60% stenosis in the mid LAD.  The coronary arteries are codominant. 2.  Normal LV systolic function normal left ventricular end-diastolic pressure 3.  Successful OCT guided angioplasty and drug-eluting stent placement to the proximal LAD overlapping the previous stent. Recommendations: Continue dual antiplatelet therapy indefinitely. Aggressive treatment of risk factors. Treat the right coronary artery medically for now. Possible discharge home tomorrow.   Disposition   Pt is being discharged home today in good condition.  Follow-up Plans & Appointments    Follow-up Information     Park Liter, MD Follow up.   Specialty: Cardiology Why: The office will call and schedule your follow up appointment Contact information: [place] [address] [phone]                Discharge Instructions     Amb Referral to Cardiac Rehabilitation   Complete by: As directed    Will send Cardiac Rehab Phase 2 referral to [facility]   Diagnosis: Coronary Stents   After initial evaluation and assessments completed: Virtual Based Care may be provided alone or  in conjunction with Phase 2 Cardiac Rehab based on patient barriers.: Yes   Call MD for:  difficulty breathing, headache or visual disturbances   Complete by: As directed    Call MD for:  extreme fatigue   Complete by: As directed    Call MD for:  hives   Complete by: As directed    Call MD for:  persistant dizziness or light-headedness   Complete by: As directed    Call MD for:  persistant nausea and vomiting   Complete by: As directed    Call MD for:  redness, tenderness, or signs of infection (pain, swelling, redness, odor or green/yellow discharge around incision site)   Complete by: As directed    Call MD for:  severe uncontrolled pain   Complete by: As directed    Call MD for:  temperature >100.4   Complete by: As directed    Diet - low sodium heart healthy   Complete by: As directed    Discharge instructions   Complete by: As directed    No driving for 3-5 days. No lifting over 5 lbs for 1 week. No sexual activity for 1 week. Keep procedure site clean & dry. If you notice increased pain, swelling, bleeding or pus, call/return!  You may shower, but no soaking baths/hot tubs/pools for 1 week.   PLEASE DO NOT MISS ANY DOSES OF YOUR PLAVIX!!!!! Also keep a log of you blood pressures and bring back to your follow up appt. Please call the office with any questions.   Patients taking blood thinners should generally stay away from medicines like ibuprofen, Advil, Motrin, naproxen, and Aleve due to risk of stomach bleeding. You may take Tylenol as directed or talk to your primary doctor about alternatives.  PLEASE ENSURE THAT YOU DO NOT RUN OUT OF YOUR PLAVIX. IF you have issues obtaining this medication due to cost please CALL the office 3-5 business days prior to running out in order to prevent missing doses of this medication.   Increase activity slowly   Complete by: As directed       Discharge Medications   Allergies as of 08/17/2020       Reactions   Septra [bactrim]  Shortness Of Breath, Itching   Sulfamethoxazole-trimethoprim Itching, Shortness Of Breath, Anaphylaxis, Other (See Comments)        Medication List     TAKE these medications    albuterol 108 (90 Base) MCG/ACT inhaler Commonly known as: VENTOLIN HFA Inhale 2 puffs into the lungs every 6 (six) hours as needed for wheezing or shortness of breath.   aspirin 81 MG tablet Take 81 mg by mouth daily.   atorvastatin 80 MG tablet Commonly known as: LIPITOR TAKE 1 TABLET(80 MG) BY MOUTH DAILY What changed: See the new instructions.   Biotin 1 MG Caps Take 1 tablet by mouth daily.   Buprenorphine HCl-Naloxone HCl 8-2 MG Film Place 1 Film under the tongue 2 (two) times daily.   carvedilol 6.25 MG tablet Commonly known as: COREG Take 6.25 mg by mouth 2 (two) times daily with a meal.   clonazePAM 1 MG tablet Commonly known as: KLONOPIN Take 0.5 mg by mouth 2 (two) times daily.   clopidogrel 75 MG tablet Commonly known as: PLAVIX TAKE 1 TABLET(75 MG) BY MOUTH DAILY What changed: See the new instructions.   cyanocobalamin 1000 MCG tablet Take 4,000 mcg by mouth daily.   cyclobenzaprine 10 MG tablet Commonly known as: FLEXERIL Take 10 mg by mouth 3 (three) times daily as needed for muscle spasms. For muscle pain   ezetimibe 10 MG tablet Commonly known as: ZETIA TAKE 1 TABLET(10 MG) BY MOUTH DAILY What changed: See the new instructions.   folic acid 258 MCG tablet Commonly known as: FOLVITE Take 1 tablet by mouth daily.   gabapentin 600 MG tablet Commonly known as: NEURONTIN Take 600 mg by mouth 4 (four) times daily.   hydrochlorothiazide 12.5 MG tablet Commonly known as: HYDRODIURIL Take 12.5  mg by mouth daily.   isosorbide mononitrate 30 MG 24 hr tablet Commonly known as: IMDUR Take 30 mg by mouth daily.   lisinopril 10 MG tablet Commonly known as: ZESTRIL Take 1 tablet (10 mg total) by mouth daily. Start taking on: August 18, 2020 What changed:  medication  strength when to take this   magnesium oxide 400 MG tablet Commonly known as: MAG-OX Take 1 tablet by mouth 2 (two) times daily.   metFORMIN 500 MG (MOD) 24 hr tablet Commonly known as: GLUMETZA Take 500 mg by mouth as needed (If glucose is elevated).   nitroGLYCERIN 0.4 MG SL tablet Commonly known as: NITROSTAT TAKE 1 TABLET UNDER THE TONGUE AS NEEDED FOR CHEST PAIN. CAN TAKE UP TO 3 PILLS 5 MINUTES APART. CALL 911 IF ALL TAKEN AND NO RELIEF. MAX 3 TABLETS What changed: See the new instructions.   pantoprazole 40 MG tablet Commonly known as: PROTONIX Take 40 mg by mouth daily.   potassium gluconate 595 (99 K) MG Tabs tablet Take 595 mg by mouth 2 (two) times daily.   vitamin B-6 25 MG tablet Commonly known as: pyridOXINE Take 25 mg by mouth daily.   Vitamin C 500 MG Caps Take 1 capsule by mouth daily.       Outstanding Labs/Studies   CBC  Duration of Discharge Encounter   Greater than 30 minutes including physician time.  Signed, Kathyrn Drown, NP 08/17/2020, 10:23 AM

## 2020-08-18 ENCOUNTER — Telehealth: Payer: Self-pay

## 2020-08-18 ENCOUNTER — Other Ambulatory Visit: Payer: Self-pay | Admitting: Cardiology

## 2020-08-18 NOTE — Telephone Encounter (Signed)
     Pt is returning call, pt said to call her at [phone]

## 2020-08-18 NOTE — Telephone Encounter (Signed)
Spoke to the patient just now and got her scheduled to see Dr. Agustin Cree on 08/23/2020 per his recommendation.    Encouraged patient to call back with any questions or concerns.

## 2020-08-18 NOTE — Telephone Encounter (Signed)
Tried calling patient. No answer and no voicemail set up for me to leave a message. 

## 2020-08-18 NOTE — Telephone Encounter (Signed)
-----   Message from Park Liter, MD sent at 08/17/2020  1:37 PM EDT ----- Regarding: RE: appointment Double book and I will be able to see her please ----- Message ----- From: Gita Kudo, RN Sent: 08/17/2020  10:18 AM EDT To: Park Liter, MD Subject: FW: appointment                                Please advise when you would like to see her. You do not have any openings in the next several weeks.  ----- Message ----- From: Tommie Raymond, NP Sent: 08/17/2020  10:15 AM EDT To: Rebeca Alert [facility]/Hp Triage, [facility]p Scheduling, # Subject: appointment                                    This is a patient of Dr. Fraser Din. She was admitted for chest pain and underwent LHC with PCI placement. She will need a follow up in the next several weeks please. She will be discharged today. Please call and inform the patient of date and time of appointment.   Thank you

## 2020-08-19 ENCOUNTER — Telehealth: Payer: Self-pay | Admitting: Medical

## 2020-08-19 NOTE — Telephone Encounter (Signed)
    Patient called the after hours line to report that she accidentally took 3 atorvastatin '80mg'$  throughout the day today. She thought it was her gabapentin which she takes up to QID. Spoke with in house pharmacy who advised watchful waiting. Warned of possible myalgias but otherwise hopeful no lasting impact. Patient was appreciate of the call.   Abigail Butts, PA-C 08/19/20; 5:13 PM

## 2020-08-23 ENCOUNTER — Ambulatory Visit: Payer: Medicare HMO | Admitting: Cardiology

## 2020-08-23 ENCOUNTER — Encounter: Payer: Self-pay | Admitting: Cardiology

## 2020-08-23 ENCOUNTER — Other Ambulatory Visit: Payer: Self-pay

## 2020-08-23 VITALS — BP 100/56 | HR 68 | Ht 64.5 in | Wt 143.0 lb

## 2020-08-23 DIAGNOSIS — F1121 Opioid dependence, in remission: Secondary | ICD-10-CM | POA: Diagnosis not present

## 2020-08-23 DIAGNOSIS — F17200 Nicotine dependence, unspecified, uncomplicated: Secondary | ICD-10-CM

## 2020-08-23 DIAGNOSIS — IMO0001 Reserved for inherently not codable concepts without codable children: Secondary | ICD-10-CM

## 2020-08-23 DIAGNOSIS — I2510 Atherosclerotic heart disease of native coronary artery without angina pectoris: Secondary | ICD-10-CM | POA: Diagnosis not present

## 2020-08-23 DIAGNOSIS — Z79891 Long term (current) use of opiate analgesic: Secondary | ICD-10-CM | POA: Diagnosis not present

## 2020-08-23 DIAGNOSIS — E119 Type 2 diabetes mellitus without complications: Secondary | ICD-10-CM

## 2020-08-23 DIAGNOSIS — F331 Major depressive disorder, recurrent, moderate: Secondary | ICD-10-CM | POA: Diagnosis not present

## 2020-08-23 DIAGNOSIS — I25110 Atherosclerotic heart disease of native coronary artery with unstable angina pectoris: Secondary | ICD-10-CM | POA: Diagnosis not present

## 2020-08-23 DIAGNOSIS — F411 Generalized anxiety disorder: Secondary | ICD-10-CM | POA: Diagnosis not present

## 2020-08-23 DIAGNOSIS — I10 Essential (primary) hypertension: Secondary | ICD-10-CM

## 2020-08-23 MED ORDER — NICOTINE 14 MG/24HR TD PT24
14.0000 mg | MEDICATED_PATCH | Freq: Every day | TRANSDERMAL | 0 refills | Status: DC
Start: 2020-08-23 — End: 2023-03-25

## 2020-08-23 MED ORDER — NICOTINE 21 MG/24HR TD PT24: 21.0000 mg | MEDICATED_PATCH | Freq: Every day | TRANSDERMAL | 0 refills | Status: DC

## 2020-08-23 MED ORDER — NICOTINE 7 MG/24HR TD PT24: 7.0000 mg | MEDICATED_PATCH | Freq: Every day | TRANSDERMAL | 0 refills | Status: DC

## 2020-08-23 NOTE — Patient Instructions (Signed)
Medication Instructions:  Your physician has recommended you make the following change in your medication:  START: Nicotine patch 21 mg for 2 weeks, then 14 mg for 2 weeks, then 7 mg for 2 weeks.  *If you need a refill on your cardiac medications before your next appointment, please call your pharmacy*   Lab Work: None If you have labs (blood work) drawn today and your tests are completely normal, you will receive your results only by: [place] (if you have MyChart) OR A paper copy in the mail If you have any lab test that is abnormal or we need to change your treatment, we will call you to review the results.   Testing/Procedures: None   Follow-Up: At [facility], you and your health needs are our priority.  As part of our continuing mission to provide you with exceptional heart care, we have created designated Provider Care Teams.  These Care Teams include your primary Cardiologist (physician) and Advanced Practice Providers (APPs -  Physician Assistants and Nurse Practitioners) who all work together to provide you with the care you need, when you need it.  We recommend signing up for the patient portal called "MyChart".  Sign up information is provided on this After Visit Summary.  MyChart is used to connect with patients for Virtual Visits (Telemedicine).  Patients are able to view lab/test results, encounter notes, upcoming appointments, etc.  Non-urgent messages can be sent to your provider as well.   To learn more about what you can do with MyChart, go to [email or web address].    Your next appointment:   3 month(s)  The format for your next appointment:   In Person  Provider:   Jenne Campus, MD   Other Instructions Managing the Challenge of Quitting Smoking Quitting smoking is a physical and mental challenge. You will face cravings, withdrawal symptoms, and temptation. Before quitting, work with your health care provider to make a plan that can help you  manage quitting. Preparation canhelp you quit and keep you from giving in. How to manage lifestyle changes Managing stress Stress can make you want to smoke, and wanting to smoke may cause stress. It is important to find ways to manage your stress. You might try some of the following: Practice relaxation techniques. Breathe slowly and deeply, in through your nose and out through your mouth. Listen to music. Soak in a bath or take a shower. Imagine a peaceful place or vacation. Get some support. Talk with family or friends about your stress. Join a support group. Talk with a counselor or therapist. Get some physical activity. Go for a walk, run, or bike ride. Play a favorite sport. Practice yoga.  Medicines Talk with your health care provider about medicines that might help you dealwith cravings and make quitting easier for you. Relationships Social situations can be difficult when you are quitting smoking. To manage this, you can: Avoid parties and other social situations where people might be smoking. Avoid alcohol. Leave right away if you have the urge to smoke. Explain to your family and friends that you are quitting smoking. Ask for support and let them know you might be a bit grumpy. Plan activities where smoking is not an option. General instructions Be aware that many people gain weight after they quit smoking. However, not everyone does. To keep from gaining weight, have a plan in place before you quit and stick to the plan after you quit. Your plan should include: Having healthy snacks. When you have  a craving, it may help to: Eat popcorn, carrots, celery, or other cut vegetables. Chew sugar-free gum. Changing how you eat. Eat small portion sizes at meals. Eat 4-6 small meals throughout the day instead of 1-2 large meals a day. Be mindful when you eat. Do not watch television or do other things that might distract you as you eat. Exercising regularly. Make time to  exercise each day. If you do not have time for a long workout, do short bouts of exercise for 5-10 minutes several times a day. Do some form of strengthening exercise, such as weight lifting. Do some exercise that gets your heart beating and causes you to breathe deeply, such as walking fast, running, swimming, or biking. This is very important. Drinking plenty of water or other low-calorie or no-calorie drinks. Drink 6-8 glasses of water daily.  How to recognize withdrawal symptoms Your body and mind may experience discomfort as you try to get used to not having nicotine in your system. These effects are called withdrawal symptoms. They may include: Feeling hungrier than normal. Having trouble concentrating. Feeling irritable or restless. Having trouble sleeping. Feeling depressed. Craving a cigarette. To manage withdrawal symptoms: Avoid places, people, and activities that trigger your cravings. Remember why you want to quit. Get plenty of sleep. Avoid coffee and other caffeinated drinks. These may worsen some of your symptoms. These symptoms may surprise you. But be assured that they are normal to havewhen quitting smoking. How to manage cravings Come up with a plan for how to deal with your cravings. The plan should include the following: A definition of the specific situation you want to deal with. An alternative action you will take. A clear idea for how this action will help. The name of someone who might help you with this. Cravings usually last for 5-10 minutes. Consider taking the following actions to help you with your plan to deal with cravings: Keep your mouth busy. Chew sugar-free gum. Suck on hard candies or a straw. Brush your teeth. Keep your hands and body busy. Change to a different activity right away. Squeeze or play with a ball. Do an activity or a hobby, such as making bead jewelry, practicing needlepoint, or working with wood. Mix up your normal  routine. Take a short exercise break. Go for a quick walk or run up and down stairs. Focus on doing something kind or helpful for someone else. Call a friend or family member to talk during a craving. Join a support group. Contact a quitline. Where to find support To get help or find a support group: Call the [facility]'s Smoking Quitline: 1-800-QUIT NOW 905-759-1736) Visit the website of the Substance Abuse and [place]: ktimeonline.com Text QUIT to SmokefreeTXTMQ:317211 Where to find more information Visit these websites to find more information on quitting smoking: [place]: www.smokefree.gov American Lung Association: www.lung.org American Cancer Society: www.cancer.org Centers for Disease Control and Prevention: http://www.wolf.info/ American Heart Association: www.heart.org Contact a health care provider if: You want to change your plan for quitting. The medicines you are taking are not helping. Your eating feels out of control or you cannot sleep. Get help right away if: You feel depressed or become very anxious. Summary Quitting smoking is a physical and mental challenge. You will face cravings, withdrawal symptoms, and temptation to smoke again. Preparation can help you as you go through these challenges. Try different techniques to manage stress, handle social situations, and prevent weight gain. You can deal  with cravings by keeping your mouth busy (such as by chewing gum), keeping your hands and body busy, calling family or friends, or contacting a quitline for people who want to quit smoking. You can deal with withdrawal symptoms by avoiding places where people smoke, getting plenty of rest, and avoiding drinks with caffeine. This information is not intended to replace advice given to you by your health care provider. Make sure you discuss any questions you have with your healthcare provider. Document Revised: 11/03/2018  Document Reviewed: 11/03/2018 Elsevier Patient Education  [place].

## 2020-08-23 NOTE — Addendum Note (Signed)
Addended by: Orvan July on: 08/23/2020 02:14 PM   Modules accepted: Orders

## 2020-08-23 NOTE — Progress Notes (Signed)
Cardiology Office Note:    Date:  08/23/2020   ID:  Makayla Fischer, DOB 1960/09/15, MRN [id number]  PCP:  Jeanie Sewer, NP  Cardiologist:  Jenne Campus, MD    Referring MD: Jeanie Sewer, NP   Chief Complaint  Patient presents with   Follow-up    History of Present Illness:    Makayla Fischer is a 60 y.o. female with past medical history significant for coronary artery disease status post PTCA and stenting with drug-eluting stent to circumflex artery in August 2020, prior she did have PTCA and stenting of LAD.  In March of this year she did have a stress test done around the hospital which was negative, past medical history is also significant for essential hypertension, dyslipidemia, chronic smoking.  She comes today to my office for follow-up after hospitalization she ended up going to [facility] because of chest pain.  Cardiac catheterization has been done which showed significant stenosis of proximal LAD, that was addressed with stenting.  Since that time she is feeling much better.  She denies have any chest pain tightness squeezing pressure burning chest.  She works on quitting smoking as well.  Past Medical History:  Diagnosis Date   Adnexal mass 09/06/2019   Anginal pain (HCC)    Anxiety    h/o panic attacks    Arthritis    all over, back, knees    CAD (coronary artery disease)    Chronic coronary artery disease 08/10/2017   Chronic pain    Coronary disease PTCA and stenting with drug-eluting stent to circumflex artery in August 2020 08/10/2017   Daily consumption of alcohol 08/10/2017   Depression    Dyslipidemia 04/28/2019   Essential hypertension 08/10/2017   Fatty liver    by 01/15/11 CT scan   Fibromyalgia    GERD (gastroesophageal reflux disease)    Hyperlipidemia    Hypertension    sees Dr. Posey Rea, stress test in 01/2011   Hypomagnesemia 08/10/2017   Hyponatremia 08/10/2017   Incisional hernia    with obstruction   Myocardial infarction  ([place]) 02/2008   Neuromuscular disorder ([place])    neuropathy   Osteoporosis    Shortness of breath    Status post insertion of drug eluting coronary artery stent 09/15/2018   Substance abuse ([place])    drugs and alcohol   Tobacco abuse 08/10/2017   Type II diabetes mellitus, well controlled ([place]) [id number]   Uncomplicated opioid dependence ([place]) 08/10/2017   Unstable angina ([place]) 09/15/2018   Varicose veins of leg with complications [id number]   Varicose veins of lower extremities with other complications [id number]   Varicose veins of lower extremity 10/12/2013    Past Surgical History:  Procedure Laterality Date   CARDIAC CATHETERIZATION     02/2008- Gaspar Cola, x2 stents    CARDIAC SURGERY     CORONARY STENT INTERVENTION N/A 08/16/2020   Procedure: CORONARY STENT INTERVENTION;  Surgeon: Wellington Hampshire, MD;  Location: [place] [facility];  Service: Cardiovascular;  Laterality: N/A;   INTRAVASCULAR IMAGING/OCT N/A 08/16/2020   Procedure: INTRAVASCULAR IMAGING/OCT;  Surgeon: Wellington Hampshire, MD;  Location: [place] [facility];  Service: Cardiovascular;  Laterality: N/A;   INTRAVASCULAR PRESSURE WIRE/FFR STUDY N/A 08/16/2020   Procedure: INTRAVASCULAR PRESSURE WIRE/FFR STUDY;  Surgeon: Wellington Hampshire, MD;  Location: [place] [facility];  Service: Cardiovascular;  Laterality: N/A;   LEFT HEART CATH AND CORONARY ANGIOGRAPHY N/A 08/16/2020   Procedure: LEFT HEART CATH AND CORONARY ANGIOGRAPHY;  Surgeon: Wellington Hampshire, MD;  Location: [place] [facility];  Service: Cardiovascular;  Laterality: N/A;   MANDIBLE FRACTURE SURGERY     1980   TONSILLECTOMY AND ADENOIDECTOMY     TUBAL LIGATION     VENTRAL HERNIA REPAIR  07/12/2011   Procedure: LAPAROSCOPIC VENTRAL HERNIA;  Surgeon: Adin Hector, MD;  Location: [place];  Service: General;  Laterality: N/A;  laparoscopic repair incisional hernia with mesh    Current Medications: Current Meds  Medication Sig   albuterol (VENTOLIN HFA) 108 (90 Base)  MCG/ACT inhaler Inhale 2 puffs into the lungs every 6 (six) hours as needed for wheezing or shortness of breath.   Ascorbic Acid (VITAMIN C) 500 MG CAPS Take 1 capsule by mouth daily.   aspirin 81 MG tablet Take 81 mg by mouth daily.   atorvastatin (LIPITOR) 80 MG tablet TAKE 1 TABLET(80 MG) BY MOUTH DAILY (Patient taking differently: Take 80 mg by mouth daily.)   Biotin 1 MG CAPS Take 1 tablet by mouth daily.   Buprenorphine HCl-Naloxone HCl 8-2 MG FILM Place 1 Film under the tongue 2 (two) times daily.   carvedilol (COREG) 6.25 MG tablet Take 6.25 mg by mouth 2 (two) times daily with a meal.   clonazePAM (KLONOPIN) 1 MG tablet Take 1 mg by mouth 2 (two) times daily.   clopidogrel (PLAVIX) 75 MG tablet TAKE 1 TABLET(75 MG) BY MOUTH DAILY (Patient taking differently: Take 75 mg by mouth once.)   cyanocobalamin 1000 MCG tablet Take 4,000 mcg by mouth daily.   cyclobenzaprine (FLEXERIL) 10 MG tablet Take 10 mg by mouth 3 (three) times daily as needed for muscle spasms. For muscle pain   ezetimibe (ZETIA) 10 MG tablet TAKE 1 TABLET(10 MG) BY MOUTH DAILY (Patient taking differently: Take 10 mg by mouth daily.)   folic acid (FOLVITE) A999333 MCG tablet Take 1 tablet by mouth daily.   gabapentin (NEURONTIN) 600 MG tablet Take 600 mg by mouth 4 (four) times daily.   hydrochlorothiazide (HYDRODIURIL) 12.5 MG tablet Take 12.5 mg by mouth daily.   isosorbide mononitrate (IMDUR) 30 MG 24 hr tablet Take 30 mg by mouth daily.   lisinopril (ZESTRIL) 10 MG tablet Take 1 tablet (10 mg total) by mouth daily.   magnesium oxide (MAG-OX) 400 MG tablet Take 1 tablet by mouth 2 (two) times daily.   metFORMIN (GLUMETZA) 500 MG (MOD) 24 hr tablet Take 500 mg by mouth as needed (If glucose is elevated).   nitroGLYCERIN (NITROSTAT) 0.4 MG SL tablet TAKE 1 TABLET UNDER THE TONGUE AS NEEDED FOR CHEST PAIN. CAN TAKE UP TO 3 PILLS 5 MINUTES APART. CALL 911 IF ALL TAKEN AND NO RELIEF. MAX 3 TABLETS (Patient taking differently:  Place 0.4 mg under the tongue every 5 (five) minutes as needed for chest pain.)   pantoprazole (PROTONIX) 40 MG tablet Take 40 mg by mouth daily.   potassium gluconate 595 (99 K) MG TABS tablet Take 595 mg by mouth 2 (two) times daily.   vitamin B-6 (PYRIDOXINE) 25 MG tablet Take 25 mg by mouth daily.     Allergies:   Septra [bactrim] and Sulfamethoxazole-trimethoprim   Social History   Socioeconomic History   Marital status: Married    Spouse name: Not on file   Number of children: Not on file   Years of education: Not on file   Highest education level: Not on file  Occupational History   Not on file  Tobacco Use   Smoking status:  Every Day    Packs/day: 1.00    Years: 28.00    Pack years: 28.00    Types: Cigarettes   Smokeless tobacco: Never  Vaping Use   Vaping Use: Never used  Substance and Sexual Activity   Alcohol use: Yes    Alcohol/week: 6.0 standard drinks    Types: 6 Cans of beer per week    Comment: 6 pack of beer / week   Drug use: No    Types: Heroin    Comment: 1.5 yr. use of Heroin- 1990's    Sexual activity: Not Currently  Other Topics Concern   Not on file  Social History Narrative   Not on file   Social Determinants of Health   Financial Resource Strain: Not on file  Food Insecurity: Not on file  Transportation Needs: Not on file  Physical Activity: Not on file  Stress: Not on file  Social Connections: Not on file     Family History: The patient's family history includes Cancer in her father; Diabetes in her mother; Heart disease in her mother; Hyperlipidemia in her mother; Hypertension in her mother. There is no history of Anesthesia problems. ROS:   Please see the history of present illness.    All 14 point review of systems negative except as described per history of present illness  EKGs/Labs/Other Studies Reviewed:      Recent Labs: 08/17/2020: BUN 11; Creatinine, Ser 0.59; Hemoglobin 8.6; Platelets 172; Potassium 4.2; Sodium 134   Recent Lipid Panel    Component Value Date/Time   CHOL 134 12/31/2019 1440   TRIG 77 12/31/2019 1440   HDL 38 (L) 12/31/2019 1440   CHOLHDL 3.5 12/31/2019 1440   LDLCALC 81 12/31/2019 1440    Physical Exam:    VS:  BP (!) 100/56 (BP Location: Left Arm, Patient Position: Sitting)   Pulse 68   Ht 5' 4.5" (1.638 m)   Wt 143 lb (64.9 kg)   SpO2 98%   BMI 24.17 kg/m     Wt Readings from Last 3 Encounters:  08/23/20 143 lb (64.9 kg)  08/16/20 147 lb 0.8 oz (66.7 kg)  05/30/20 147 lb (66.7 kg)     GEN:  Well nourished, well developed in no acute distress HEENT: Normal NECK: No JVD; No carotid bruits LYMPHATICS: No lymphadenopathy CARDIAC: RRR, no murmurs, no rubs, no gallops RESPIRATORY:  Clear to auscultation without rales, wheezing or rhonchi  ABDOMEN: Soft, non-tender, non-distended MUSCULOSKELETAL:  No edema; No deformity  SKIN: Warm and dry LOWER EXTREMITIES: no swelling NEUROLOGIC:  Alert and oriented x 3 PSYCHIATRIC:  Normal affect   ASSESSMENT:    1. Coronary artery disease involving native coronary artery of native heart with unstable angina pectoris ([place])   2. Coronary artery disease involving native coronary artery of native heart without angina pectoris   3. Essential hypertension   4. Type II diabetes mellitus, well controlled ([place])   5. Smoking    PLAN:    In order of problems listed above:  Coronary disease stable from that point review on dual antiplatelet therapy I stressed importance of taking that medication for at least 6 months maybe even longer depends on her in risk factors modifications. Essential hypertension blood pressure well controlled continue present management. Type 2 diabetes followed by antimedicine team.  Dyslipidemia, her LDL 81 HDL 49 this is from 04/05/2020.  We will recheck her fasting lipid before the next few months to see if we need to augment his therapy which  I think will be the case. Smoking obviously huge problem spent a  great of time talking about this she is determined to quit we will give her prescription for nicotine patch.  [facility] for this visit  Medication Adjustments/Labs and Tests Ordered: Current medicines are reviewed at length with the patient today.  Concerns regarding medicines are outlined above.  No orders of the defined types were placed in this encounter.  Medication changes: No orders of the defined types were placed in this encounter.   Signed, Park Liter, MD, [facility] 08/23/2020 2:00 PM    [place]

## 2020-08-24 DIAGNOSIS — I83811 Varicose veins of right lower extremities with pain: Secondary | ICD-10-CM | POA: Diagnosis not present

## 2020-08-24 DIAGNOSIS — M79604 Pain in right leg: Secondary | ICD-10-CM | POA: Diagnosis not present

## 2020-08-25 ENCOUNTER — Telehealth (HOSPITAL_COMMUNITY): Payer: Self-pay

## 2020-08-25 NOTE — Telephone Encounter (Signed)
Per phase I cardiac rehab, fax cardiac rehab referral to [place] [facility]. °

## 2020-08-28 DIAGNOSIS — I2510 Atherosclerotic heart disease of native coronary artery without angina pectoris: Secondary | ICD-10-CM | POA: Diagnosis not present

## 2020-08-28 DIAGNOSIS — J449 Chronic obstructive pulmonary disease, unspecified: Secondary | ICD-10-CM | POA: Diagnosis not present

## 2020-08-28 DIAGNOSIS — I10 Essential (primary) hypertension: Secondary | ICD-10-CM | POA: Diagnosis not present

## 2020-08-28 DIAGNOSIS — E782 Mixed hyperlipidemia: Secondary | ICD-10-CM | POA: Diagnosis not present

## 2020-09-06 DIAGNOSIS — F1121 Opioid dependence, in remission: Secondary | ICD-10-CM | POA: Diagnosis not present

## 2020-09-06 DIAGNOSIS — F411 Generalized anxiety disorder: Secondary | ICD-10-CM | POA: Diagnosis not present

## 2020-09-06 DIAGNOSIS — F331 Major depressive disorder, recurrent, moderate: Secondary | ICD-10-CM | POA: Diagnosis not present

## 2020-09-06 DIAGNOSIS — Z79891 Long term (current) use of opiate analgesic: Secondary | ICD-10-CM | POA: Diagnosis not present

## 2020-09-07 ENCOUNTER — Other Ambulatory Visit: Payer: Self-pay

## 2020-09-07 ENCOUNTER — Telehealth: Payer: Self-pay

## 2020-09-07 ENCOUNTER — Emergency Department (HOSPITAL_COMMUNITY)
Admission: EM | Admit: 2020-09-07 | Discharge: 2020-09-07 | Disposition: A | Discharge status: Home / Self Care | Payer: Medicare HMO | Attending: Emergency Medicine | Admitting: Emergency Medicine

## 2020-09-07 ENCOUNTER — Encounter (HOSPITAL_COMMUNITY): Payer: Self-pay

## 2020-09-07 ENCOUNTER — Emergency Department (HOSPITAL_COMMUNITY): Payer: Medicare HMO

## 2020-09-07 DIAGNOSIS — Z7984 Long term (current) use of oral hypoglycemic drugs: Secondary | ICD-10-CM | POA: Insufficient documentation

## 2020-09-07 DIAGNOSIS — Z7902 Long term (current) use of antithrombotics/antiplatelets: Secondary | ICD-10-CM | POA: Diagnosis not present

## 2020-09-07 DIAGNOSIS — R0602 Shortness of breath: Secondary | ICD-10-CM | POA: Diagnosis not present

## 2020-09-07 DIAGNOSIS — Z7982 Long term (current) use of aspirin: Secondary | ICD-10-CM | POA: Diagnosis not present

## 2020-09-07 DIAGNOSIS — R079 Chest pain, unspecified: Secondary | ICD-10-CM | POA: Insufficient documentation

## 2020-09-07 DIAGNOSIS — E119 Type 2 diabetes mellitus without complications: Secondary | ICD-10-CM | POA: Diagnosis not present

## 2020-09-07 DIAGNOSIS — I10 Essential (primary) hypertension: Secondary | ICD-10-CM | POA: Diagnosis not present

## 2020-09-07 DIAGNOSIS — I2510 Atherosclerotic heart disease of native coronary artery without angina pectoris: Secondary | ICD-10-CM | POA: Insufficient documentation

## 2020-09-07 DIAGNOSIS — Z79899 Other long term (current) drug therapy: Secondary | ICD-10-CM | POA: Diagnosis not present

## 2020-09-07 DIAGNOSIS — F17210 Nicotine dependence, cigarettes, uncomplicated: Secondary | ICD-10-CM | POA: Insufficient documentation

## 2020-09-07 DIAGNOSIS — R0789 Other chest pain: Secondary | ICD-10-CM | POA: Diagnosis not present

## 2020-09-07 LAB — CBC
HCT: 29.4 % — ABNORMAL LOW (ref 36.0–46.0)
Hemoglobin: 9.1 g/dL — ABNORMAL LOW (ref 12.0–15.0)
MCH: 22 pg — ABNORMAL LOW (ref 26.0–34.0)
MCHC: 31 g/dL (ref 30.0–36.0)
MCV: 71.2 fL — ABNORMAL LOW (ref 80.0–100.0)
Platelets: 191 10*3/uL (ref 150–400)
RBC: 4.13 MIL/uL (ref 3.87–5.11)
RDW: 17.2 % — ABNORMAL HIGH (ref 11.5–15.5)
WBC: 4.4 10*3/uL (ref 4.0–10.5)
nRBC: 0 % (ref 0.0–0.2)

## 2020-09-07 LAB — BASIC METABOLIC PANEL
Anion gap: 9 (ref 5–15)
BUN: 9 mg/dL (ref 8–23)
CO2: 29 mmol/L (ref 22–32)
Calcium: 8.9 mg/dL (ref 8.9–10.3)
Chloride: 90 mmol/L — ABNORMAL LOW (ref 98–111)
Creatinine, Ser: 0.69 mg/dL (ref 0.44–1.00)
GFR, Estimated: >60 mL/min (ref >60)
Glucose, Bld: 111 mg/dL — ABNORMAL HIGH (ref 70–99)
Potassium: 3.9 mmol/L (ref 3.5–5.1)
Sodium: 128 mmol/L — ABNORMAL LOW (ref 135–145)

## 2020-09-07 LAB — TROPONIN I (HIGH SENSITIVITY)
Troponin I (High Sensitivity): 4 ng/L (ref <18)
Troponin I (High Sensitivity): 4 ng/L (ref <18)

## 2020-09-07 MED ORDER — NITROGLYCERIN 2 % TD OINT
0.5000 [in_us] | TOPICAL_OINTMENT | Freq: Once | TRANSDERMAL | Status: AC
  Administered 2020-09-07: 0.5 [in_us] via TOPICAL
  Filled 2020-09-07: qty 1

## 2020-09-07 MED ORDER — ASPIRIN 81 MG PO CHEW
324.0000 mg | CHEWABLE_TABLET | Freq: Once | ORAL | Status: AC
  Administered 2020-09-07: 324 mg via ORAL
  Filled 2020-09-07: qty 4

## 2020-09-07 MED ORDER — ISOSORBIDE MONONITRATE ER 60 MG PO TB24: 60.0000 mg | ORAL_TABLET | Freq: Every day | ORAL | 0 refills | Status: DC

## 2020-09-07 NOTE — ED Provider Notes (Signed)
Mak[facility]la [facility] Provider Note   CSN: [id number] Arrival date & time: 09/07/20  1847     History Chief Complaint  Patient presents with   Chest Pain    Makayla Fischer is a 60 y.o. female.   Chest Pain  This patient is a 60 year old female with known coronary disease status post stenting x4, she was most recently stented within the last month, she had been admitted to the hospital around July 20, she had a follow-up with cardiology on July 27, she presents from home today with a complaint of chest pain which is left-sided, started last night, intermittent, went away but came back earlier this morning and has been intermittent throughout the day associated with shortness of breath and radiation into the left arm and the left side of the back.  She reports this is exactly the same as her prior cardiac disease when she had unstable angina.  She has taken 3 nitroglycerin today, there is some transient relief when she does that, she has been taking her aspirin and Plavix religiously, she continues to smoke cigarettes.  She denies swelling of the legs, denies fevers or chills, denies coughing, denies COVID exposures.  Currently her symptoms are rated 5 out of 10.  It got worse when she tried to come back from the waiting room.  Past Medical History:  Diagnosis Date   Adnexal mass 09/06/2019   Anginal pain (HCC)    Anxiety    h/o panic attacks    Arthritis    all over, back, knees    CAD (coronary artery disease)    Chronic coronary artery disease 08/10/2017   Chronic pain    Coronary disease PTCA and stenting with drug-eluting stent to circumflex artery in August 2020 08/10/2017   Daily consumption of alcohol 08/10/2017   Depression    Dyslipidemia 04/28/2019   Essential hypertension 08/10/2017   Fatty liver    by 01/15/11 CT scan   Fibromyalgia    GERD (gastroesophageal reflux disease)    Hyperlipidemia    Hypertension    sees Dr. Posey Rea,  stress test in 01/2011   Hypomagnesemia 08/10/2017   Hyponatremia 08/10/2017   Incisional hernia    with obstruction   Myocardial infarction ([place]) 02/2008   Neuromuscular disorder ([place])    neuropathy   Osteoporosis    Shortness of breath    Status post insertion of drug eluting coronary artery stent 09/15/2018   Substance abuse ([place])    drugs and alcohol   Tobacco abuse 08/10/2017   Type II diabetes mellitus, well controlled ([place]) [id number]   Uncomplicated opioid dependence ([place]) 08/10/2017   Unstable angina ([place]) 09/15/2018   Varicose veins of leg with complications [id number]   Varicose veins of lower extremities with other complications [id number]   Varicose veins of lower extremity 10/12/2013    Patient Active Problem List   Diagnosis Date Noted   Substance abuse ([place])    Shortness of breath    Neuromuscular disorder ([place])    Hyperlipidemia    GERD (gastroesophageal reflux disease)    Fibromyalgia    Fatty liver    Depression    Chronic pain    CAD (coronary artery disease)    Arthritis    Anxiety    Anginal pain ([place])    Adnexal mass 09/06/2019   Dyslipidemia 04/28/2019   Status post insertion of drug eluting coronary artery stent 09/15/2018   Unstable angina ([place]) 09/15/2018   Hypertension 09/15/2018  Coronary disease PTCA and stenting with drug-eluting stent to circumflex artery in August 2020, PTCA and stenting of proximal LAD summer 2022 08/10/2017   Daily consumption of alcohol 08/10/2017   Essential hypertension 08/10/2017   Hypomagnesemia 08/10/2017   Hyponatremia 08/10/2017   Smoking 08/10/2017   Type II diabetes mellitus, well controlled ([place]) 0000000   Uncomplicated opioid dependence ([place]) 08/10/2017   Chronic coronary artery disease 08/10/2017   Varicose veins of leg with complications [id number]   Varicose veins of lower extremities with other complications [id number]   Varicose veins of lower extremity 10/12/2013   Incisional hernia 06/12/2011    Myocardial infarction [facility]) 02/2008    Past Surgical History:  Procedure Laterality Date   CARDIAC CATHETERIZATION     02/2008- Gaspar Cola, x2 stents    CARDIAC SURGERY     CHOLECYSTECTOMY     CORONARY STENT INTERVENTION N/A 08/16/2020   Procedure: CORONARY STENT INTERVENTION;  Surgeon: Wellington Hampshire, MD;  Location: [place] [facility];  Service: Cardiovascular;  Laterality: N/A;   INTRAVASCULAR IMAGING/OCT N/A 08/16/2020   Procedure: INTRAVASCULAR IMAGING/OCT;  Surgeon: Wellington Hampshire, MD;  Location: [place] [facility];  Service: Cardiovascular;  Laterality: N/A;   INTRAVASCULAR PRESSURE WIRE/FFR STUDY N/A 08/16/2020   Procedure: INTRAVASCULAR PRESSURE WIRE/FFR STUDY;  Surgeon: Wellington Hampshire, MD;  Location: [place] [facility];  Service: Cardiovascular;  Laterality: N/A;   LEFT HEART CATH AND CORONARY ANGIOGRAPHY N/A 08/16/2020   Procedure: LEFT HEART CATH AND CORONARY ANGIOGRAPHY;  Surgeon: Wellington Hampshire, MD;  Location: [place] [facility];  Service: Cardiovascular;  Laterality: N/A;   MANDIBLE FRACTURE SURGERY     1980   TONSILLECTOMY AND ADENOIDECTOMY     TUBAL LIGATION     VENTRAL HERNIA REPAIR  07/12/2011   Procedure: LAPAROSCOPIC VENTRAL HERNIA;  Surgeon: Adin Hector, MD;  Location: [place];  Service: General;  Laterality: N/A;  laparoscopic repair incisional hernia with mesh     OB History   No obstetric history on file.     Family History  Problem Relation Age of Onset   Diabetes Mother    Hyperlipidemia Mother    Hypertension Mother    Heart disease Mother    Cancer Father        lymphoma   Anesthesia problems Neg Hx     Social History   Tobacco Use   Smoking status: Every Day    Packs/day: 1.00    Years: 28.00    Pack years: 28.00    Types: Cigarettes   Smokeless tobacco: Never  Vaping Use   Vaping Use: Never used  Substance Use Topics   Alcohol use: Yes    Alcohol/week: 6.0 standard drinks    Types: 6 Cans of beer per week    Comment:  6 pack of beer / week   Drug use: No    Types: Heroin    Comment: 1.5 yr. use of Heroin- 1990's     Home Medications Prior to Admission medications   Medication Sig Start Date End Date Taking? Authorizing Provider  isosorbide mononitrate (IMDUR) 60 MG 24 hr tablet Take 1 tablet (60 mg total) by mouth daily. 09/07/20 10/07/20 Yes Noemi Chapel, MD  albuterol (VENTOLIN HFA) 108 (90 Base) MCG/ACT inhaler Inhale 2 puffs into the lungs every 6 (six) hours as needed for wheezing or shortness of breath.    [provider]  Ascorbic Acid (VITAMIN C) 500 MG CAPS Take 1 capsule by mouth daily.  [provider]  aspirin 81 MG tablet Take 81 mg by mouth daily.    [provider]  atorvastatin (LIPITOR) 80 MG tablet TAKE 1 TABLET(80 MG) BY MOUTH DAILY Patient taking differently: Take 80 mg by mouth daily. 01/24/20   Park Liter, MD  Biotin 1 MG CAPS Take 1 tablet by mouth daily.    [provider]  Buprenorphine HCl-Naloxone HCl 8-2 MG FILM Place 1 Film under the tongue 2 (two) times daily.    [provider]  carvedilol (COREG) 6.25 MG tablet Take 6.25 mg by mouth 2 (two) times daily with a meal.    [provider]  clonazePAM (KLONOPIN) 1 MG tablet Take 1 mg by mouth 2 (two) times daily. 08/02/20   [provider]  clopidogrel (PLAVIX) 75 MG tablet TAKE 1 TABLET(75 MG) BY MOUTH DAILY Patient taking differently: Take 75 mg by mouth once. 03/24/20   Park Liter, MD  cyanocobalamin 1000 MCG tablet Take 4,000 mcg by mouth daily.    [provider]  cyclobenzaprine (FLEXERIL) 10 MG tablet Take 10 mg by mouth 3 (three) times daily as needed for muscle spasms. For muscle pain    [provider]  ezetimibe (ZETIA) 10 MG tablet TAKE 1 TABLET(10 MG) BY MOUTH DAILY Patient taking differently: Take 10 mg by mouth daily. 07/19/20   Park Liter, MD  folic acid (FOLVITE) A999333 MCG tablet Take 1 tablet by mouth daily.     [provider]  gabapentin (NEURONTIN) 600 MG tablet Take 600 mg by mouth 4 (four) times daily. 03/22/19   [provider]  hydrochlorothiazide (HYDRODIURIL) 12.5 MG tablet Take 12.5 mg by mouth daily.    [provider]  lisinopril (ZESTRIL) 10 MG tablet Take 1 tablet (10 mg total) by mouth daily. 08/18/20   Kathyrn Drown D, NP  magnesium oxide (MAG-OX) 400 MG tablet Take 1 tablet by mouth 2 (two) times daily. 08/12/17   [provider]  metFORMIN (GLUMETZA) 500 MG (MOD) 24 hr tablet Take 500 mg by mouth as needed (If glucose is elevated).    [provider]  nicotine (NICODERM CQ - DOSED IN MG/24 HOURS) 14 mg/24hr patch Place 1 patch (14 mg total) onto the skin daily. 08/23/20   Park Liter, MD  nicotine (NICODERM CQ - DOSED IN MG/24 HOURS) 21 mg/24hr patch Place 1 patch (21 mg total) onto the skin daily. 08/23/20   Park Liter, MD  nicotine (NICODERM CQ - DOSED IN MG/24 HR) 7 mg/24hr patch Place 1 patch (7 mg total) onto the skin daily. 08/23/20   Park Liter, MD  nitroGLYCERIN (NITROSTAT) 0.4 MG SL tablet TAKE 1 TABLET UNDER THE TONGUE AS NEEDED FOR CHEST PAIN. CAN TAKE UP TO 3 PILLS 5 MINUTES APART. CALL 911 IF ALL TAKEN AND NO RELIEF. MAX 3 TABLETS Patient taking differently: Place 0.4 mg under the tongue every 5 (five) minutes as needed for chest pain. 08/18/20   Park Liter, MD  pantoprazole (PROTONIX) 40 MG tablet Take 40 mg by mouth daily. 11/24/19   [provider]  potassium gluconate 595 (99 K) MG TABS tablet Take 595 mg by mouth 2 (two) times daily.    [provider]  vitamin B-6 (PYRIDOXINE) 25 MG tablet Take 25 mg by mouth daily.    [provider]    Allergies    Septra [bactrim] and Sulfamethoxazole-trimethoprim  Review of Systems   Review of Systems  Cardiovascular:  Positive for chest pain.  All other systems reviewed and are negative.  Physical Exam Updated Vital Signs BP  (!) 169/73   Pulse (!) 52   Temp 99 F (37.2 C) (Oral)   Resp 11   Ht 1.651 m ('5\' 5"'$ )   Wt 67.1 kg   SpO2 99%   BMI 24.63 kg/m   Physical Exam Vitals and nursing note reviewed.  Constitutional:      General: She is not in acute distress.    Appearance: She is well-developed.  HENT:     Head: Normocephalic and atraumatic.     Mouth/Throat:     Pharynx: No oropharyngeal exudate.  Eyes:     General: No scleral icterus.       Right eye: No discharge.        Left eye: No discharge.     Conjunctiva/sclera: Conjunctivae normal.     Pupils: Pupils are equal, round, and reactive to light.  Neck:     Thyroid: No thyromegaly.     Vascular: No JVD.  Cardiovascular:     Rate and Rhythm: Normal rate and regular rhythm.     Heart sounds: Normal heart sounds. No murmur heard.   No friction rub. No gallop.  Pulmonary:     Effort: Pulmonary effort is normal. No respiratory distress.     Breath sounds: Normal breath sounds. No wheezing or rales.  Abdominal:     General: Bowel sounds are normal. There is no distension.     Palpations: Abdomen is soft. There is no mass.     Tenderness: There is no abdominal tenderness.  Musculoskeletal:        General: No tenderness. Normal range of motion.     Cervical back: Normal range of motion and neck supple.  Lymphadenopathy:     Cervical: No cervical adenopathy.  Skin:    General: Skin is warm and dry.     Findings: No erythema or rash.  Neurological:     Mental Status: She is alert.     Coordination: Coordination normal.  Psychiatric:        Behavior: Behavior normal.    ED Results / Procedures / Treatments   Labs (all labs ordered are listed, but only abnormal results are displayed) Labs Reviewed  BASIC METABOLIC PANEL - Abnormal; Notable for the following components:      Result Value   Sodium 128 (*)    Chloride 90 (*)    Glucose, Bld 111 (*)    All other components within normal limits  CBC - Abnormal; Notable for the  following components:   Hemoglobin 9.1 (*)    HCT 29.4 (*)    MCV 71.2 (*)    MCH 22.0 (*)    RDW 17.2 (*)    All other components within normal limits  TROPONIN I (HIGH SENSITIVITY)  TROPONIN I (HIGH SENSITIVITY)    EKG EKG Interpretation  Date/Time:  Thursday September 07 2020 18:52:39 EDT Ventricular Rate:  55 PR Interval:  176 QRS Duration: 102 QT Interval:  458 QTC Calculation: 438 R Axis:   66 Text Interpretation: Sinus bradycardia Minimal voltage criteria for LVH, may be normal variant ( Sokolow-Lyon ) Borderline ECG since last tracing - 08/17/20, no significant changes seen Confirmed by Noemi Chapel [phone]) on 09/07/2020 8:11:42 PM  Radiology DG Chest 2 View  Result Date: 09/07/2020 CLINICAL DATA:  Chest pain 08/15/2020 EXAM: CHEST - 2 VIEW COMPARISON:  None. FINDINGS: The heart size and mediastinal contours are  within normal limits. Both lungs are clear. Scoliosis IMPRESSION: No active cardiopulmonary disease. Electronically Signed   By: Donavan Foil M.D.   On: 09/07/2020 19:48    Procedures Procedures   Medications Ordered in ED Medications  aspirin chewable tablet 324 mg (324 mg Oral Given 09/07/20 2019)  nitroGLYCERIN (NITROGLYN) 2 % ointment 0.5 inch (0.5 inches Topical Given 09/07/20 2020)    ED Course  I have reviewed the triage vital signs and the nursing notes.  Pertinent labs & imaging results that were available during my care of the patient were reviewed by me and considered in my medical decision making (see chart for details).    MDM Rules/Calculators/A&P                           This patient does not have any signs of changing on the EKG, in fact it is the same as the one from July 21.  She has known coronary disease with multiple stents, appropriate on antiplatelet therapy, having intermittent chest pain some with exertion today.  Will discuss with cardiology, troponin pending, patient is agreeable to the plan, blood pressure 142/76.  The patient's  initial troponin is normal, EKG is unremarkable, vital signs reflect some hypertension at 169/73, I discussed the care with cardiologist on call for [facility], and she recommends that this patient does not need to come in for her sx with normal trop and EKG -recommends outpatient follow-up, more expedited clinical follow-up and doubling the patient's long-acting nitrates from 30 mg to 60 mg of Imdur.  Second troponin pending, if negative or unchanged will discharge patient home with these recommendations.  Second troponin is negative unchanged and virtually undetectable, the patient will be updated on her care,  Patient expressed understanding, she is agreeable to the plan, stable for discharge  Final Clinical Impression(s) / ED Diagnoses Final diagnoses:  Left-sided chest pain    Rx / DC Orders ED Discharge Orders          Ordered    isosorbide mononitrate (IMDUR) 60 MG 24 hr tablet  Daily        09/07/20 2239             Noemi Chapel, MD 09/07/20 2241

## 2020-09-07 NOTE — ED Provider Notes (Signed)
Emergency Medicine Provider Triage Evaluation Note  Makayla Fischer , a 60 y.o. female  was evaluated in triage.  Pt complains of chest pain.  Chest pain has been intermittent over the last week.  Today pain started at noon and has been constant since then.  Pain is located to the left side of her chest and radiates to left arm.  Pain described as a dull ache.  Pain was worse when walking her dog.  Pain improved with nitro.  Patient states that she took 3 nitros throughout the day.  Patient endorses shortness of breath.  Denies any diaphoresis, nausea, vomiting, unilateral leg swelling or tenderness.  Per chart review patient had heart cath on 7/20 which showed significant underlying three-vessel coronary artery disease with patent stents in the LAD and left circumflex.  Patient received DES to the proximal LAD overlapping the previous stent.    Review of Systems  Positive: Chest pain, shortness of breath Negative: Diaphoresis, nausea, vomiting, abdominal pain, leg swelling or tenderness  Physical Exam  BP (!) 142/76 (BP Location: Right Arm)   Pulse (!) 54   Temp 99 F (37.2 C) (Oral)   Resp 16   Ht '5\' 5"'$  (1.651 m)   Wt 67.1 kg   SpO2 96%   BMI 24.63 kg/m  Gen:   Awake, no distress   Resp:  Normal effort, lungs clear to auscultation bilaterally MSK:   Moves extremities without difficulty , no swelling or tenderness noted to bilateral lower extremities. Other:    Medical Decision Making  Medically screening exam initiated at 7:02 PM.  Appropriate orders placed.  Makayla Fischer was informed that the remainder of the evaluation will be completed by another provider, this initial triage assessment does not replace that evaluation, and the importance of remaining in the ED until their evaluation is complete.  The patient appears stable so that the remainder of the work up may be completed by another provider.      Dyann Ruddle 09/07/20 1904    Drenda Freeze,  MD 09/07/20 2322

## 2020-09-07 NOTE — Discharge Instructions (Addendum)
Your testing has been normal and shows no signs of heart attack  I spoke with the cardiologist about your care, they reviewed your case and recommend that we increase the dose of your Imdur from 30 mg to 60 mg/day  Please call the cardiology office and have them move your appointment up so that you do not have to wait until October to be seen however if you should develop severe or worsening symptoms and when to return to the emergency department immediately  Talk to your family doctor about follow-up in the next 2 days  Thank you for letting us take care of you today!  Please obtain all of your results from medical records or have your doctors office obtain the results - share them with your doctor - you should be seen at your doctors office in the next 2 days. Call today to arrange your follow up. Take the medications as prescribed. Please review all of the medicines and only take them if you do not have an allergy to them. Please be aware that if you are taking birth control pills, taking other prescriptions, ESPECIALLY ANTIBIOTICS may make the birth control ineffective - if this is the case, either do not engage in sexual activity or use alternative methods of birth control such as condoms until you have finished the medicine and your family doctor says it is OK to restart them. If you are on a blood thinner such as COUMADIN, be aware that any other medicine that you take may cause the coumadin to either work too much, or not enough - you should have your coumadin level rechecked in next 7 days if this is the case.  ?  It is also a possibility that you have an allergic reaction to any of the medicines that you have been prescribed - Everybody reacts differently to medications and while MOST people have no trouble with most medicines, you may have a reaction such as nausea, vomiting, rash, swelling, shortness of breath. If this is the case, please stop taking the medicine immediately and contact your  physician.   If you were given a medication in the ED such as percocet, vicodin, or morphine, be aware that these medicines are sedating and may change your ability to take care of yourself adequately for several hours after being given this medicines - you should not drive or take care of small children if you were given this medicine in the Emergency Department or if you have been prescribed these types of medicines. ?   You should return to the ER IMMEDIATELY if you develop severe or worsening symptoms.

## 2020-09-07 NOTE — ED Triage Notes (Signed)
Pt arrives via [facility] for evaluation of CP. Pt was recently admitted for cardiac workup with a stent. EMS stated that pt has 6/10 L sided CP radiating to L arm. EMS administered 324 mg ASA, pt took on SL nitro prior to EMS arrival.   EMS last VS - 136/80, HR 56, 98% on RA, RR 16, CBG 137  #18 R FA

## 2020-09-07 NOTE — Telephone Encounter (Signed)
Pt here in the office for labs but made comment to Shirlean Mylar that she was having chest pain. I went and spoke with the pt who states that she is having left chest pain aching and dull in nature. Pt states that the pain radiates into the lt arm/shoulder. Pt states that she has nausea but no vomiting, shortness of breath and diaphoresis when outside. Pt states that she took 1 NTG this am and it helped and took another NTG when enroute to the office. Pt has a hx of 4 stents and states this feels like it did in the past. Pt has been advised to let us call 911 but she declined as she is going to drive less than 10 minutes and she will call 911. Pt has been made of the riskof heart damage which could cause her to be unable to perform daily activities/work and possible death. Pt verbalized understanding and had no additional questions. Dr. Geraldo Pitter was DOD and made aware as well.

## 2020-09-08 ENCOUNTER — Telehealth: Payer: Self-pay

## 2020-09-08 ENCOUNTER — Telehealth: Payer: Self-pay | Admitting: Cardiology

## 2020-09-08 NOTE — Telephone Encounter (Signed)
Pt advised that she can take 2 of her 30's. Pt states she will take 1 in the morning and 1 in the evening for awhile as before they made her very dizzy. Pt will let us know how she tolerates the medications. Pt verbalized understanding and had no additional questions.

## 2020-09-08 NOTE — Telephone Encounter (Signed)
Left a message to return my call.

## 2020-09-08 NOTE — Telephone Encounter (Signed)
Pt c/o medication issue:  1. Name of Medication:  isosorbide mononitrate (IMDUR) 60 MG 24 hr tablet  2. How are you currently taking this medication (dosage and times per day)?  Took 30 MG's this morning  3. Are you having a reaction (difficulty breathing--STAT)? No   4. What is your medication issue? Wondering if she can take 30 MG's a day. Please advise.

## 2020-09-08 NOTE — Telephone Encounter (Signed)
-----   Message from Lowella Grip, [place] sent at 09/08/2020  7:55 AM EDT -----  ----- Message ----- From: Ledora Bottcher, PA Sent: 09/08/2020   6:40 AM EDT To: Rebeca Alert [facility]/Hp Triage  Pt was seen in the ER with chest pain. Medications were adjusted, but should be seen by Dr. Raliegh Ip. Can you please arrange a follow up next week?  Thank you! Angie

## 2020-09-10 ENCOUNTER — Telehealth: Payer: Self-pay | Admitting: Physician Assistant

## 2020-09-10 NOTE — Telephone Encounter (Signed)
Call returned to pt after calling the on call number. Reports waking with her ear ringing, after which time she noted elevated BP  at 175/80 before her medications and 160/75 after her medications. Wt 148lbs. She states her BP normally runs in the 0000000 systolic. She did have some pork this weekend - salt intake and monitoring wt / BP reviewed. After BP elevated, she noted chest pain and is uncertain if this was just due to worrying about her BP and ear ringing.  States her chest discomfort is improving with taking 1 SL nitro.  CP feels different from that experienced in July 2022 but vague in description. She will continue to monitor her BP and call back if ongoing CP or elevated BP over 130/80 (at which time additional recommendations will be provided).  She expressed understanding and appreciation for the call.

## 2020-09-11 ENCOUNTER — Telehealth: Payer: Self-pay

## 2020-09-11 NOTE — Telephone Encounter (Signed)
-----   Message from Lowella Grip, [place] sent at 09/08/2020  7:55 AM EDT -----  ----- Message ----- From: Ledora Bottcher, PA Sent: 09/08/2020   6:40 AM EDT To: Rebeca Alert [facility]/Hp Triage  Pt was seen in the ER with chest pain. Medications were adjusted, but should be seen by Dr. Raliegh Ip. Can you please arrange a follow up next week?  Thank you! Angie

## 2020-09-11 NOTE — Telephone Encounter (Signed)
Spoke with patient was hospitalized for chest pain at [facility]. Meds were adjusted but her chest pain continues. I worked her in to See Dr. Agustin Cree tomorrow.

## 2020-09-12 ENCOUNTER — Ambulatory Visit: Payer: Medicare HMO | Admitting: Cardiology

## 2020-09-12 ENCOUNTER — Encounter: Payer: Self-pay | Admitting: Cardiology

## 2020-09-12 ENCOUNTER — Other Ambulatory Visit: Payer: Self-pay

## 2020-09-12 VITALS — BP 106/60 | HR 58 | Ht 64.5 in | Wt 142.8 lb

## 2020-09-12 DIAGNOSIS — F17200 Nicotine dependence, unspecified, uncomplicated: Secondary | ICD-10-CM | POA: Diagnosis not present

## 2020-09-12 DIAGNOSIS — I2510 Atherosclerotic heart disease of native coronary artery without angina pectoris: Secondary | ICD-10-CM

## 2020-09-12 DIAGNOSIS — E119 Type 2 diabetes mellitus without complications: Secondary | ICD-10-CM | POA: Diagnosis not present

## 2020-09-12 DIAGNOSIS — I10 Essential (primary) hypertension: Secondary | ICD-10-CM

## 2020-09-12 MED ORDER — RANOLAZINE ER 500 MG PO TB12: 500.0000 mg | ORAL_TABLET | Freq: Two times a day (BID) | ORAL | 3 refills | Status: AC

## 2020-09-12 NOTE — Addendum Note (Signed)
Addended by: Resa Miner I on: 09/12/2020 02:07 PM   Modules accepted: Orders

## 2020-09-12 NOTE — Patient Instructions (Signed)
Medication Instructions:  Your physician has recommended you make the following change in your medication:  START: Ranolazine 500 mg take one tablet by mouth twice daily.  *If you need a refill on your cardiac medications before your next appointment, please call your pharmacy*   Lab Work: None If you have labs (blood work) drawn today and your tests are completely normal, you will receive your results only by: [place] (if you have MyChart) OR A paper copy in the mail If you have any lab test that is abnormal or we need to change your treatment, we will call you to review the results.   Testing/Procedures: None   Follow-Up: At [facility], you and your health needs are our priority.  As part of our continuing mission to provide you with exceptional heart care, we have created designated Provider Care Teams.  These Care Teams include your primary Cardiologist (physician) and Advanced Practice Providers (APPs -  Physician Assistants and Nurse Practitioners) who all work together to provide you with the care you need, when you need it.  We recommend signing up for the patient portal called "MyChart".  Sign up information is provided on this After Visit Summary.  MyChart is used to connect with patients for Virtual Visits (Telemedicine).  Patients are able to view lab/test results, encounter notes, upcoming appointments, etc.  Non-urgent messages can be sent to your provider as well.   To learn more about what you can do with MyChart, go to [email or web address].    Your next appointment:   6 month(s)  The format for your next appointment:   In Person  Provider:   Jenne Campus, MD   Other Instructions

## 2020-09-12 NOTE — Progress Notes (Signed)
Cardiology Office Note:    Date:  09/12/2020   ID:  BALJINDER RADEN, DOB 05/18/1960, MRN [id number]  PCP:  Jeanie Sewer, NP  Cardiologist:  Jenne Campus, MD    Referring MD: Jeanie Sewer, NP   Chief Complaint  Patient presents with   Chest Pain    History of Present Illness:    NANINE DYSINGER is a 60 y.o. female  SHALEI NEYLON is a 60 y.o. female with past medical history significant for coronary artery disease status post PTCA and stenting with drug-eluting stent to circumflex artery in August 2020, prior she did have PTCA and stenting of LAD.  In March of this year she did have a stress test done around the hospital which was negative, past medical history is also significant for essential hypertension, dyslipidemia, chronic smoking In August 16, 2020 she did have another cardiac catheterization done and she required stenting to the left anterior descending artery.  Recently she ended up going back to the emergency room because of episode of chest pain.  She says she developed chest pain while she gets upset.  She was instructed to increase dose of Imdur however she try 1 dose of 60 mg she could not tolerate it.  Since that time she did have few episode of chest pain when she gets upset.  Past Medical History:  Diagnosis Date   Adnexal mass 09/06/2019   Anginal pain (HCC)    Anxiety    h/o panic attacks    Arthritis    all over, back, knees    CAD (coronary artery disease)    Chronic coronary artery disease 08/10/2017   Chronic pain    Coronary disease PTCA and stenting with drug-eluting stent to circumflex artery in August 2020 08/10/2017   Daily consumption of alcohol 08/10/2017   Depression    Dyslipidemia 04/28/2019   Essential hypertension 08/10/2017   Fatty liver    by 01/15/11 CT scan   Fibromyalgia    GERD (gastroesophageal reflux disease)    Hyperlipidemia    Hypertension    sees Dr. Posey Rea, stress test in 01/2011   Hypomagnesemia 08/10/2017    Hyponatremia 08/10/2017   Incisional hernia    with obstruction   Myocardial infarction ([place]) 02/2008   Neuromuscular disorder ([place])    neuropathy   Osteoporosis    Shortness of breath    Status post insertion of drug eluting coronary artery stent 09/15/2018   Substance abuse ([place])    drugs and alcohol   Tobacco abuse 08/10/2017   Type II diabetes mellitus, well controlled ([place]) [id number]   Uncomplicated opioid dependence ([place]) 08/10/2017   Unstable angina ([place]) 09/15/2018   Varicose veins of leg with complications [id number]   Varicose veins of lower extremities with other complications [id number]   Varicose veins of lower extremity 10/12/2013    Past Surgical History:  Procedure Laterality Date   CARDIAC CATHETERIZATION     02/2008- Gaspar Cola, x2 stents    CARDIAC SURGERY     CHOLECYSTECTOMY     CORONARY STENT INTERVENTION N/A 08/16/2020   Procedure: CORONARY STENT INTERVENTION;  Surgeon: Wellington Hampshire, MD;  Location: [place] [facility];  Service: Cardiovascular;  Laterality: N/A;   INTRAVASCULAR IMAGING/OCT N/A 08/16/2020   Procedure: INTRAVASCULAR IMAGING/OCT;  Surgeon: Wellington Hampshire, MD;  Location: [place] [facility];  Service: Cardiovascular;  Laterality: N/A;   INTRAVASCULAR PRESSURE WIRE/FFR STUDY N/A 08/16/2020   Procedure: INTRAVASCULAR PRESSURE WIRE/FFR STUDY;  Surgeon: Wellington Hampshire,  MD;  Location: [facility] [facility];  Service: Cardiovascular;  Laterality: N/A;   LEFT HEART CATH AND CORONARY ANGIOGRAPHY N/A 08/16/2020   Procedure: LEFT HEART CATH AND CORONARY ANGIOGRAPHY;  Surgeon: Wellington Hampshire, MD;  Location: [place] [facility];  Service: Cardiovascular;  Laterality: N/A;   MANDIBLE FRACTURE SURGERY     1980   TONSILLECTOMY AND ADENOIDECTOMY     TUBAL LIGATION     VENTRAL HERNIA REPAIR  07/12/2011   Procedure: LAPAROSCOPIC VENTRAL HERNIA;  Surgeon: Adin Hector, MD;  Location: [place];  Service: General;  Laterality: N/A;  laparoscopic repair  incisional hernia with mesh    Current Medications: Current Meds  Medication Sig   albuterol (VENTOLIN HFA) 108 (90 Base) MCG/ACT inhaler Inhale 2 puffs into the lungs every 6 (six) hours as needed for wheezing or shortness of breath.   Ascorbic Acid (VITAMIN C) 500 MG CAPS Take 1 capsule by mouth daily.   aspirin 81 MG tablet Take 81 mg by mouth daily.   atorvastatin (LIPITOR) 80 MG tablet TAKE 1 TABLET(80 MG) BY MOUTH DAILY (Patient taking differently: Take 80 mg by mouth daily.)   Biotin 1 MG CAPS Take 1 tablet by mouth daily.   Buprenorphine HCl-Naloxone HCl 8-2 MG FILM Place 1 Film under the tongue 2 (two) times daily.   carvedilol (COREG) 6.25 MG tablet Take 6.25 mg by mouth 2 (two) times daily with a meal.   clonazePAM (KLONOPIN) 1 MG tablet Take 1 mg by mouth 2 (two) times daily.   clopidogrel (PLAVIX) 75 MG tablet TAKE 1 TABLET(75 MG) BY MOUTH DAILY (Patient taking differently: Take 75 mg by mouth once.)   cyanocobalamin 1000 MCG tablet Take 4,000 mcg by mouth daily.   cyclobenzaprine (FLEXERIL) 10 MG tablet Take 10 mg by mouth 3 (three) times daily as needed for muscle spasms. For muscle pain   ezetimibe (ZETIA) 10 MG tablet TAKE 1 TABLET(10 MG) BY MOUTH DAILY (Patient taking differently: Take 10 mg by mouth daily.)   folic acid (FOLVITE) A999333 MCG tablet Take 1 tablet by mouth daily.   gabapentin (NEURONTIN) 600 MG tablet Take 600 mg by mouth 4 (four) times daily.   hydrochlorothiazide (HYDRODIURIL) 12.5 MG tablet Take 12.5 mg by mouth daily.   isosorbide mononitrate (IMDUR) 60 MG 24 hr tablet Take 1 tablet (60 mg total) by mouth daily. (Patient taking differently: Take 30 mg by mouth daily.)   lisinopril (ZESTRIL) 10 MG tablet Take 1 tablet (10 mg total) by mouth daily.   magnesium oxide (MAG-OX) 400 MG tablet Take 1 tablet by mouth 2 (two) times daily.   metFORMIN (GLUMETZA) 500 MG (MOD) 24 hr tablet Take 500 mg by mouth as needed (If glucose is elevated).   nicotine (NICODERM CQ  - DOSED IN MG/24 HOURS) 14 mg/24hr patch Place 1 patch (14 mg total) onto the skin daily.   nicotine (NICODERM CQ - DOSED IN MG/24 HOURS) 21 mg/24hr patch Place 1 patch (21 mg total) onto the skin daily.   nicotine (NICODERM CQ - DOSED IN MG/24 HR) 7 mg/24hr patch Place 1 patch (7 mg total) onto the skin daily.   nitroGLYCERIN (NITROSTAT) 0.4 MG SL tablet TAKE 1 TABLET UNDER THE TONGUE AS NEEDED FOR CHEST PAIN. CAN TAKE UP TO 3 PILLS 5 MINUTES APART. CALL 911 IF ALL TAKEN AND NO RELIEF. MAX 3 TABLETS (Patient taking differently: Place 0.4 mg under the tongue every 5 (five) minutes as needed for chest pain.)   pantoprazole (  PROTONIX) 40 MG tablet Take 40 mg by mouth daily.   potassium gluconate 595 (99 K) MG TABS tablet Take 595 mg by mouth 2 (two) times daily.   vitamin B-6 (PYRIDOXINE) 25 MG tablet Take 25 mg by mouth daily.     Allergies:   Septra [bactrim] and Sulfamethoxazole-trimethoprim   Social History   Socioeconomic History   Marital status: Married    Spouse name: Not on file   Number of children: Not on file   Years of education: Not on file   Highest education level: Not on file  Occupational History   Not on file  Tobacco Use   Smoking status: Every Day    Packs/day: 1.00    Years: 28.00    Pack years: 28.00    Types: Cigarettes   Smokeless tobacco: Never  Vaping Use   Vaping Use: Never used  Substance and Sexual Activity   Alcohol use: Yes    Alcohol/week: 6.0 standard drinks    Types: 6 Cans of beer per week    Comment: 6 pack of beer / week   Drug use: No    Types: Heroin    Comment: 1.5 yr. use of Heroin- 1990's    Sexual activity: Not Currently  Other Topics Concern   Not on file  Social History Narrative   Not on file   Social Determinants of Health   Financial Resource Strain: Not on file  Food Insecurity: Not on file  Transportation Needs: Not on file  Physical Activity: Not on file  Stress: Not on file  Social Connections: Not on file      Family History: The patient's family history includes Cancer in her father; Diabetes in her mother; Heart disease in her mother; Hyperlipidemia in her mother; Hypertension in her mother. There is no history of Anesthesia problems. ROS:   Please see the history of present illness.    All 14 point review of systems negative except as described per history of present illness  EKGs/Labs/Other Studies Reviewed:      Recent Labs: 09/07/2020: BUN 9; Creatinine, Ser 0.69; Hemoglobin 9.1; Platelets 191; Potassium 3.9; Sodium 128  Recent Lipid Panel    Component Value Date/Time   CHOL 134 12/31/2019 1440   TRIG 77 12/31/2019 1440   HDL 38 (L) 12/31/2019 1440   CHOLHDL 3.5 12/31/2019 1440   LDLCALC 81 12/31/2019 1440    Physical Exam:    VS:  BP 106/60 (BP Location: Left Arm, Patient Position: Sitting)   Pulse (!) 58   Ht 5' 4.5" (1.638 m)   Wt 142 lb 12.8 oz (64.8 kg)   SpO2 96%   BMI 24.13 kg/m     Wt Readings from Last 3 Encounters:  09/12/20 142 lb 12.8 oz (64.8 kg)  09/07/20 148 lb (67.1 kg)  08/23/20 143 lb (64.9 kg)     GEN:  Well nourished, well developed in no acute distress HEENT: Normal NECK: No JVD; No carotid bruits LYMPHATICS: No lymphadenopathy CARDIAC: RRR, no murmurs, no rubs, no gallops RESPIRATORY:  Clear to auscultation without rales, wheezing or rhonchi  ABDOMEN: Soft, non-tender, non-distended MUSCULOSKELETAL:  No edema; No deformity  SKIN: Warm and dry LOWER EXTREMITIES: no swelling NEUROLOGIC:  Alert and oriented x 3 PSYCHIATRIC:  Normal affect   ASSESSMENT:    1. Coronary artery disease involving native coronary artery of native heart without angina pectoris   2. Essential hypertension   3. Type II diabetes mellitus, well controlled ([place])  4. Smoking    PLAN:    In order of problems listed above:  Coronary artery disease status post multiple PCI and stenting.  Still having some symptoms again.  I will start her back on the ranolazine 500  mg twice daily, I asked her to let me know if she still have some recurrences of chest pain.  I really try not to put her into the cardiac Cath Lab we can avoid it.  Hopefully will be able to control her symptoms with medications. Essential hypertension: Blood pressure today is actually low.  She describes episodes of high blood pressure.  I asked her to keep monitoring this sensation and also ask her to bring blood pressure monitor to [place] so we can check accuracy of the device. Diabetes followed by antimedicine team last hemoglobin A1c 5.5. Dyslipidemia I did review her K PN which show me LDL of 81 HDL 49.  She is on high intense statin which I will continue she is also on Zetia. Smoking she smokes only 3 cigarettes a day I congratulated her for it and told her that the main goal is to completely discontinue it.  She is ready for it and she is working hard at   Medication Adjustments/Labs and Tests Ordered: Current medicines are reviewed at length with the patient today.  Concerns regarding medicines are outlined above.  No orders of the defined types were placed in this encounter.  Medication changes: No orders of the defined types were placed in this encounter.   Signed, Park Liter, MD, [facility] 09/12/2020 2:01 PM    [facility]

## 2020-09-20 DIAGNOSIS — F331 Major depressive disorder, recurrent, moderate: Secondary | ICD-10-CM | POA: Diagnosis not present

## 2020-09-20 DIAGNOSIS — F1120 Opioid dependence, uncomplicated: Secondary | ICD-10-CM | POA: Diagnosis not present

## 2020-09-20 DIAGNOSIS — Z79891 Long term (current) use of opiate analgesic: Secondary | ICD-10-CM | POA: Diagnosis not present

## 2020-09-20 DIAGNOSIS — F411 Generalized anxiety disorder: Secondary | ICD-10-CM | POA: Diagnosis not present

## 2020-09-20 DIAGNOSIS — Z6823 Body mass index (BMI) 23.0-23.9, adult: Secondary | ICD-10-CM | POA: Diagnosis not present

## 2020-10-04 DIAGNOSIS — F411 Generalized anxiety disorder: Secondary | ICD-10-CM | POA: Diagnosis not present

## 2020-10-04 DIAGNOSIS — F1120 Opioid dependence, uncomplicated: Secondary | ICD-10-CM | POA: Diagnosis not present

## 2020-10-04 DIAGNOSIS — F331 Major depressive disorder, recurrent, moderate: Secondary | ICD-10-CM | POA: Diagnosis not present

## 2020-10-06 DIAGNOSIS — K7460 Unspecified cirrhosis of liver: Secondary | ICD-10-CM | POA: Diagnosis not present

## 2020-10-06 DIAGNOSIS — K219 Gastro-esophageal reflux disease without esophagitis: Secondary | ICD-10-CM | POA: Diagnosis not present

## 2020-10-06 DIAGNOSIS — D638 Anemia in other chronic diseases classified elsewhere: Secondary | ICD-10-CM | POA: Diagnosis not present

## 2020-10-06 DIAGNOSIS — I2510 Atherosclerotic heart disease of native coronary artery without angina pectoris: Secondary | ICD-10-CM | POA: Diagnosis not present

## 2020-10-06 DIAGNOSIS — E782 Mixed hyperlipidemia: Secondary | ICD-10-CM | POA: Diagnosis not present

## 2020-10-06 DIAGNOSIS — F411 Generalized anxiety disorder: Secondary | ICD-10-CM | POA: Diagnosis not present

## 2020-10-06 DIAGNOSIS — E11610 Type 2 diabetes mellitus with diabetic neuropathic arthropathy: Secondary | ICD-10-CM | POA: Diagnosis not present

## 2020-10-06 DIAGNOSIS — I10 Essential (primary) hypertension: Secondary | ICD-10-CM | POA: Diagnosis not present

## 2020-10-06 DIAGNOSIS — J449 Chronic obstructive pulmonary disease, unspecified: Secondary | ICD-10-CM | POA: Diagnosis not present

## 2020-10-18 DIAGNOSIS — F1120 Opioid dependence, uncomplicated: Secondary | ICD-10-CM | POA: Diagnosis not present

## 2020-10-18 DIAGNOSIS — Z79891 Long term (current) use of opiate analgesic: Secondary | ICD-10-CM | POA: Diagnosis not present

## 2020-10-18 DIAGNOSIS — F331 Major depressive disorder, recurrent, moderate: Secondary | ICD-10-CM | POA: Diagnosis not present

## 2020-10-18 DIAGNOSIS — F411 Generalized anxiety disorder: Secondary | ICD-10-CM | POA: Diagnosis not present

## 2020-10-26 ENCOUNTER — Telehealth: Payer: Self-pay | Admitting: Cardiology

## 2020-10-26 DIAGNOSIS — J449 Chronic obstructive pulmonary disease, unspecified: Secondary | ICD-10-CM | POA: Diagnosis not present

## 2020-10-26 DIAGNOSIS — E7800 Pure hypercholesterolemia, unspecified: Secondary | ICD-10-CM | POA: Diagnosis not present

## 2020-10-26 DIAGNOSIS — Z79899 Other long term (current) drug therapy: Secondary | ICD-10-CM | POA: Diagnosis not present

## 2020-10-26 DIAGNOSIS — I25118 Atherosclerotic heart disease of native coronary artery with other forms of angina pectoris: Secondary | ICD-10-CM | POA: Diagnosis not present

## 2020-10-26 DIAGNOSIS — R079 Chest pain, unspecified: Secondary | ICD-10-CM | POA: Diagnosis not present

## 2020-10-26 DIAGNOSIS — Z720 Tobacco use: Secondary | ICD-10-CM | POA: Diagnosis not present

## 2020-10-26 DIAGNOSIS — R519 Headache, unspecified: Secondary | ICD-10-CM | POA: Diagnosis not present

## 2020-10-26 DIAGNOSIS — Z7982 Long term (current) use of aspirin: Secondary | ICD-10-CM | POA: Diagnosis not present

## 2020-10-26 DIAGNOSIS — I10 Essential (primary) hypertension: Secondary | ICD-10-CM | POA: Diagnosis not present

## 2020-10-26 DIAGNOSIS — F17210 Nicotine dependence, cigarettes, uncomplicated: Secondary | ICD-10-CM | POA: Diagnosis not present

## 2020-10-26 DIAGNOSIS — I249 Acute ischemic heart disease, unspecified: Secondary | ICD-10-CM | POA: Diagnosis not present

## 2020-10-26 DIAGNOSIS — K2100 Gastro-esophageal reflux disease with esophagitis, without bleeding: Secondary | ICD-10-CM | POA: Diagnosis not present

## 2020-10-26 DIAGNOSIS — Z7902 Long term (current) use of antithrombotics/antiplatelets: Secondary | ICD-10-CM | POA: Diagnosis not present

## 2020-10-26 NOTE — Telephone Encounter (Signed)
Called patient. She reports high pressures for the past few days, 203/100, 215/103, 183/92, 221/115, and today 151/87. She reports blurry vision in left eye, left arm pain, left sided dull chest pain,headaches, and very tired. Advised patient to be seen in the emergency department as soon as possible.

## 2020-10-26 NOTE — Telephone Encounter (Signed)
Pt c/o BP issue: STAT if pt c/o blurred vision, one-sided weakness or slurred speech  1. What are your last 5 BP readings?  151/87 HR 55  2. Are you having any other symptoms (ex. Dizziness, headache, blurred vision, passed out)? Blurry vision in L eye, Left hand going numb, fingers are tingling, L arm pain, on and off headaches,   3. What is your BP issue? Patient is concerned about BP  Pt c/o of Chest Pain: STAT if CP now or developed within 24 hours  1. Are you having CP right now? Its more like a dull ache not really a pain  2. Are you experiencing any other symptoms (ex. SOB, nausea, vomiting, sweating)? See above   3. How long have you been experiencing CP? A few days  4. Is your CP continuous or coming and going? Comes and goes   5. Have you taken Nitroglycerin? no ?

## 2020-10-27 DIAGNOSIS — I10 Essential (primary) hypertension: Secondary | ICD-10-CM | POA: Diagnosis not present

## 2020-10-27 DIAGNOSIS — R079 Chest pain, unspecified: Secondary | ICD-10-CM | POA: Diagnosis not present

## 2020-10-27 DIAGNOSIS — Z8679 Personal history of other diseases of the circulatory system: Secondary | ICD-10-CM | POA: Diagnosis not present

## 2020-10-27 DIAGNOSIS — E785 Hyperlipidemia, unspecified: Secondary | ICD-10-CM | POA: Diagnosis not present

## 2020-11-01 DIAGNOSIS — F1120 Opioid dependence, uncomplicated: Secondary | ICD-10-CM | POA: Diagnosis not present

## 2020-11-01 DIAGNOSIS — F411 Generalized anxiety disorder: Secondary | ICD-10-CM | POA: Diagnosis not present

## 2020-11-01 DIAGNOSIS — F331 Major depressive disorder, recurrent, moderate: Secondary | ICD-10-CM | POA: Diagnosis not present

## 2020-11-07 DIAGNOSIS — M549 Dorsalgia, unspecified: Secondary | ICD-10-CM | POA: Diagnosis not present

## 2020-11-07 DIAGNOSIS — I10 Essential (primary) hypertension: Secondary | ICD-10-CM | POA: Diagnosis not present

## 2020-11-07 DIAGNOSIS — G8929 Other chronic pain: Secondary | ICD-10-CM | POA: Diagnosis not present

## 2020-11-15 DIAGNOSIS — Z6823 Body mass index (BMI) 23.0-23.9, adult: Secondary | ICD-10-CM | POA: Diagnosis not present

## 2020-11-15 DIAGNOSIS — F331 Major depressive disorder, recurrent, moderate: Secondary | ICD-10-CM | POA: Diagnosis not present

## 2020-11-15 DIAGNOSIS — F411 Generalized anxiety disorder: Secondary | ICD-10-CM | POA: Diagnosis not present

## 2020-11-15 DIAGNOSIS — Z79891 Long term (current) use of opiate analgesic: Secondary | ICD-10-CM | POA: Diagnosis not present

## 2020-11-15 DIAGNOSIS — F1120 Opioid dependence, uncomplicated: Secondary | ICD-10-CM | POA: Diagnosis not present

## 2020-11-16 DIAGNOSIS — F1120 Opioid dependence, uncomplicated: Secondary | ICD-10-CM | POA: Diagnosis not present

## 2020-11-21 DIAGNOSIS — I10 Essential (primary) hypertension: Secondary | ICD-10-CM | POA: Diagnosis not present

## 2020-11-23 ENCOUNTER — Ambulatory Visit: Payer: Medicare HMO | Admitting: Cardiology

## 2020-11-27 DIAGNOSIS — E782 Mixed hyperlipidemia: Secondary | ICD-10-CM | POA: Diagnosis not present

## 2020-11-27 DIAGNOSIS — J449 Chronic obstructive pulmonary disease, unspecified: Secondary | ICD-10-CM | POA: Diagnosis not present

## 2020-11-27 DIAGNOSIS — I2510 Atherosclerotic heart disease of native coronary artery without angina pectoris: Secondary | ICD-10-CM | POA: Diagnosis not present

## 2020-11-27 DIAGNOSIS — I10 Essential (primary) hypertension: Secondary | ICD-10-CM | POA: Diagnosis not present

## 2020-11-29 DIAGNOSIS — M8588 Other specified disorders of bone density and structure, other site: Secondary | ICD-10-CM | POA: Diagnosis not present

## 2020-11-29 DIAGNOSIS — Z79891 Long term (current) use of opiate analgesic: Secondary | ICD-10-CM | POA: Diagnosis not present

## 2020-11-29 DIAGNOSIS — F411 Generalized anxiety disorder: Secondary | ICD-10-CM | POA: Diagnosis not present

## 2020-11-29 DIAGNOSIS — F331 Major depressive disorder, recurrent, moderate: Secondary | ICD-10-CM | POA: Diagnosis not present

## 2020-11-29 DIAGNOSIS — N959 Unspecified menopausal and perimenopausal disorder: Secondary | ICD-10-CM | POA: Diagnosis not present

## 2020-11-29 DIAGNOSIS — M8580 Other specified disorders of bone density and structure, unspecified site: Secondary | ICD-10-CM | POA: Diagnosis not present

## 2020-11-29 DIAGNOSIS — F1120 Opioid dependence, uncomplicated: Secondary | ICD-10-CM | POA: Diagnosis not present

## 2020-12-13 DIAGNOSIS — F411 Generalized anxiety disorder: Secondary | ICD-10-CM | POA: Diagnosis not present

## 2020-12-13 DIAGNOSIS — F331 Major depressive disorder, recurrent, moderate: Secondary | ICD-10-CM | POA: Diagnosis not present

## 2020-12-13 DIAGNOSIS — Z79891 Long term (current) use of opiate analgesic: Secondary | ICD-10-CM | POA: Diagnosis not present

## 2020-12-13 DIAGNOSIS — F1120 Opioid dependence, uncomplicated: Secondary | ICD-10-CM | POA: Diagnosis not present

## 2020-12-13 DIAGNOSIS — Z6823 Body mass index (BMI) 23.0-23.9, adult: Secondary | ICD-10-CM | POA: Diagnosis not present

## 2020-12-15 DIAGNOSIS — G8929 Other chronic pain: Secondary | ICD-10-CM | POA: Diagnosis not present

## 2020-12-15 DIAGNOSIS — I10 Essential (primary) hypertension: Secondary | ICD-10-CM | POA: Diagnosis not present

## 2020-12-15 DIAGNOSIS — E782 Mixed hyperlipidemia: Secondary | ICD-10-CM | POA: Diagnosis not present

## 2020-12-15 DIAGNOSIS — F411 Generalized anxiety disorder: Secondary | ICD-10-CM | POA: Diagnosis not present

## 2020-12-15 DIAGNOSIS — E785 Hyperlipidemia, unspecified: Secondary | ICD-10-CM | POA: Diagnosis not present

## 2020-12-15 DIAGNOSIS — M549 Dorsalgia, unspecified: Secondary | ICD-10-CM | POA: Diagnosis not present

## 2020-12-15 DIAGNOSIS — Z23 Encounter for immunization: Secondary | ICD-10-CM | POA: Diagnosis not present

## 2020-12-15 DIAGNOSIS — I2510 Atherosclerotic heart disease of native coronary artery without angina pectoris: Secondary | ICD-10-CM | POA: Diagnosis not present

## 2020-12-15 DIAGNOSIS — J449 Chronic obstructive pulmonary disease, unspecified: Secondary | ICD-10-CM | POA: Diagnosis not present

## 2020-12-15 DIAGNOSIS — E11610 Type 2 diabetes mellitus with diabetic neuropathic arthropathy: Secondary | ICD-10-CM | POA: Diagnosis not present

## 2020-12-15 DIAGNOSIS — D638 Anemia in other chronic diseases classified elsewhere: Secondary | ICD-10-CM | POA: Diagnosis not present

## 2020-12-27 DIAGNOSIS — F331 Major depressive disorder, recurrent, moderate: Secondary | ICD-10-CM | POA: Diagnosis not present

## 2020-12-27 DIAGNOSIS — F1120 Opioid dependence, uncomplicated: Secondary | ICD-10-CM | POA: Diagnosis not present

## 2020-12-27 DIAGNOSIS — Z79891 Long term (current) use of opiate analgesic: Secondary | ICD-10-CM | POA: Diagnosis not present

## 2020-12-27 DIAGNOSIS — F411 Generalized anxiety disorder: Secondary | ICD-10-CM | POA: Diagnosis not present

## 2021-01-01 ENCOUNTER — Other Ambulatory Visit: Payer: Self-pay | Admitting: Cardiology

## 2021-01-01 DIAGNOSIS — I25118 Atherosclerotic heart disease of native coronary artery with other forms of angina pectoris: Secondary | ICD-10-CM

## 2021-01-10 DIAGNOSIS — Z79891 Long term (current) use of opiate analgesic: Secondary | ICD-10-CM | POA: Diagnosis not present

## 2021-01-10 DIAGNOSIS — F331 Major depressive disorder, recurrent, moderate: Secondary | ICD-10-CM | POA: Diagnosis not present

## 2021-01-10 DIAGNOSIS — F1120 Opioid dependence, uncomplicated: Secondary | ICD-10-CM | POA: Diagnosis not present

## 2021-01-10 DIAGNOSIS — Z6823 Body mass index (BMI) 23.0-23.9, adult: Secondary | ICD-10-CM | POA: Diagnosis not present

## 2021-01-10 DIAGNOSIS — F411 Generalized anxiety disorder: Secondary | ICD-10-CM | POA: Diagnosis not present

## 2021-01-15 ENCOUNTER — Other Ambulatory Visit: Payer: Self-pay | Admitting: Cardiology

## 2021-01-15 DIAGNOSIS — E782 Mixed hyperlipidemia: Secondary | ICD-10-CM

## 2021-01-15 DIAGNOSIS — I25118 Atherosclerotic heart disease of native coronary artery with other forms of angina pectoris: Secondary | ICD-10-CM

## 2021-01-24 DIAGNOSIS — F331 Major depressive disorder, recurrent, moderate: Secondary | ICD-10-CM | POA: Diagnosis not present

## 2021-01-24 DIAGNOSIS — F1120 Opioid dependence, uncomplicated: Secondary | ICD-10-CM | POA: Diagnosis not present

## 2021-01-24 DIAGNOSIS — Z79891 Long term (current) use of opiate analgesic: Secondary | ICD-10-CM | POA: Diagnosis not present

## 2021-01-24 DIAGNOSIS — F411 Generalized anxiety disorder: Secondary | ICD-10-CM | POA: Diagnosis not present

## 2021-01-26 DIAGNOSIS — I2510 Atherosclerotic heart disease of native coronary artery without angina pectoris: Secondary | ICD-10-CM | POA: Diagnosis not present

## 2021-01-26 DIAGNOSIS — J449 Chronic obstructive pulmonary disease, unspecified: Secondary | ICD-10-CM | POA: Diagnosis not present

## 2021-01-26 DIAGNOSIS — E782 Mixed hyperlipidemia: Secondary | ICD-10-CM | POA: Diagnosis not present

## 2021-01-26 DIAGNOSIS — I10 Essential (primary) hypertension: Secondary | ICD-10-CM | POA: Diagnosis not present

## 2021-02-01 DIAGNOSIS — M19041 Primary osteoarthritis, right hand: Secondary | ICD-10-CM | POA: Diagnosis not present

## 2021-02-05 DIAGNOSIS — F411 Generalized anxiety disorder: Secondary | ICD-10-CM | POA: Diagnosis not present

## 2021-02-05 DIAGNOSIS — M549 Dorsalgia, unspecified: Secondary | ICD-10-CM | POA: Diagnosis not present

## 2021-02-05 DIAGNOSIS — G8929 Other chronic pain: Secondary | ICD-10-CM | POA: Diagnosis not present

## 2021-02-07 DIAGNOSIS — Z6823 Body mass index (BMI) 23.0-23.9, adult: Secondary | ICD-10-CM | POA: Diagnosis not present

## 2021-02-07 DIAGNOSIS — F1120 Opioid dependence, uncomplicated: Secondary | ICD-10-CM | POA: Diagnosis not present

## 2021-02-07 DIAGNOSIS — F331 Major depressive disorder, recurrent, moderate: Secondary | ICD-10-CM | POA: Diagnosis not present

## 2021-02-07 DIAGNOSIS — Z79891 Long term (current) use of opiate analgesic: Secondary | ICD-10-CM | POA: Diagnosis not present

## 2021-02-07 DIAGNOSIS — F411 Generalized anxiety disorder: Secondary | ICD-10-CM | POA: Diagnosis not present

## 2021-02-22 DIAGNOSIS — F1120 Opioid dependence, uncomplicated: Secondary | ICD-10-CM | POA: Diagnosis not present

## 2021-02-22 DIAGNOSIS — Z79891 Long term (current) use of opiate analgesic: Secondary | ICD-10-CM | POA: Diagnosis not present

## 2021-02-22 DIAGNOSIS — F411 Generalized anxiety disorder: Secondary | ICD-10-CM | POA: Diagnosis not present

## 2021-02-22 DIAGNOSIS — F331 Major depressive disorder, recurrent, moderate: Secondary | ICD-10-CM | POA: Diagnosis not present

## 2021-02-27 DIAGNOSIS — J449 Chronic obstructive pulmonary disease, unspecified: Secondary | ICD-10-CM | POA: Diagnosis not present

## 2021-02-27 DIAGNOSIS — I2510 Atherosclerotic heart disease of native coronary artery without angina pectoris: Secondary | ICD-10-CM | POA: Diagnosis not present

## 2021-02-27 DIAGNOSIS — E782 Mixed hyperlipidemia: Secondary | ICD-10-CM | POA: Diagnosis not present

## 2021-03-06 DIAGNOSIS — M50221 Other cervical disc displacement at C4-C5 level: Secondary | ICD-10-CM | POA: Diagnosis not present

## 2021-03-06 DIAGNOSIS — M4802 Spinal stenosis, cervical region: Secondary | ICD-10-CM | POA: Diagnosis not present

## 2021-03-06 DIAGNOSIS — M50223 Other cervical disc displacement at C6-C7 level: Secondary | ICD-10-CM | POA: Diagnosis not present

## 2021-03-06 DIAGNOSIS — M47812 Spondylosis without myelopathy or radiculopathy, cervical region: Secondary | ICD-10-CM | POA: Diagnosis not present

## 2021-03-09 DIAGNOSIS — M4316 Spondylolisthesis, lumbar region: Secondary | ICD-10-CM | POA: Diagnosis not present

## 2021-03-09 DIAGNOSIS — M5125 Other intervertebral disc displacement, thoracolumbar region: Secondary | ICD-10-CM | POA: Diagnosis not present

## 2021-03-09 DIAGNOSIS — M5126 Other intervertebral disc displacement, lumbar region: Secondary | ICD-10-CM | POA: Diagnosis not present

## 2021-03-09 DIAGNOSIS — M48061 Spinal stenosis, lumbar region without neurogenic claudication: Secondary | ICD-10-CM | POA: Diagnosis not present

## 2021-03-09 DIAGNOSIS — M5124 Other intervertebral disc displacement, thoracic region: Secondary | ICD-10-CM | POA: Diagnosis not present

## 2021-03-09 DIAGNOSIS — M546 Pain in thoracic spine: Secondary | ICD-10-CM | POA: Diagnosis not present

## 2021-03-09 DIAGNOSIS — M545 Low back pain, unspecified: Secondary | ICD-10-CM | POA: Diagnosis not present

## 2021-03-14 DIAGNOSIS — F1120 Opioid dependence, uncomplicated: Secondary | ICD-10-CM | POA: Diagnosis not present

## 2021-03-14 DIAGNOSIS — Z79891 Long term (current) use of opiate analgesic: Secondary | ICD-10-CM | POA: Diagnosis not present

## 2021-03-14 DIAGNOSIS — F411 Generalized anxiety disorder: Secondary | ICD-10-CM | POA: Diagnosis not present

## 2021-03-14 DIAGNOSIS — F331 Major depressive disorder, recurrent, moderate: Secondary | ICD-10-CM | POA: Diagnosis not present

## 2021-03-19 ENCOUNTER — Encounter: Payer: Self-pay | Admitting: Cardiology

## 2021-03-19 ENCOUNTER — Ambulatory Visit: Payer: Medicare HMO | Admitting: Cardiology

## 2021-03-19 ENCOUNTER — Other Ambulatory Visit: Payer: Self-pay

## 2021-03-19 VITALS — BP 90/60 | HR 64 | Ht 64.5 in | Wt 150.0 lb

## 2021-03-19 DIAGNOSIS — I2510 Atherosclerotic heart disease of native coronary artery without angina pectoris: Secondary | ICD-10-CM

## 2021-03-19 DIAGNOSIS — Z6824 Body mass index (BMI) 24.0-24.9, adult: Secondary | ICD-10-CM | POA: Diagnosis not present

## 2021-03-19 DIAGNOSIS — I10 Essential (primary) hypertension: Secondary | ICD-10-CM

## 2021-03-19 DIAGNOSIS — F17200 Nicotine dependence, unspecified, uncomplicated: Secondary | ICD-10-CM | POA: Diagnosis not present

## 2021-03-19 DIAGNOSIS — F411 Generalized anxiety disorder: Secondary | ICD-10-CM | POA: Diagnosis not present

## 2021-03-19 DIAGNOSIS — D509 Iron deficiency anemia, unspecified: Secondary | ICD-10-CM | POA: Diagnosis not present

## 2021-03-19 DIAGNOSIS — D638 Anemia in other chronic diseases classified elsewhere: Secondary | ICD-10-CM | POA: Diagnosis not present

## 2021-03-19 NOTE — Patient Instructions (Signed)

## 2021-03-19 NOTE — Progress Notes (Signed)
Cardiology Office Note:    Date:  03/19/2021   ID:  Makayla Fischer, DOB March 04, 1960, MRN [id number]  PCP:  Marga Hoots, NP  Cardiologist:  Jenne Campus, MD    Referring MD: Jeanie Sewer, NP   Chief Complaint  Patient presents with   Follow-up  Well  History of Present Illness:    Makayla Fischer is a 61 y.o. female  with past medical history significant for coronary artery disease status post PTCA and stenting with drug-eluting stent to circumflex artery in August 2020, prior she did have PTCA and stenting of LAD.  In March of this year she did have a stress test done around the hospital which was negative, past medical history is also significant for essential hypertension, dyslipidemia, chronic smoking In August 16, 2020 she did have another cardiac catheterization done and she required stenting to the left anterior descending artery. She comes today to my office for follow-up.  Overall seems to be doing well last time when I spoke to her she was complaining of having some chest pain medication has been adjusted now she is doing very well.  Denies have any chest pain tightness squeezing pressure burning chest.  Sadly she still continues to smoke  Past Medical History:  Diagnosis Date   Adnexal mass 09/06/2019   Anginal pain (HCC)    Anxiety    h/o panic attacks    Arthritis    all over, back, knees    CAD (coronary artery disease)    Chronic coronary artery disease 08/10/2017   Chronic pain    Coronary disease PTCA and stenting with drug-eluting stent to circumflex artery in August 2020 08/10/2017   Daily consumption of alcohol 08/10/2017   Depression    Dyslipidemia 04/28/2019   Essential hypertension 08/10/2017   Fatty liver    by 01/15/11 CT scan   Fibromyalgia    GERD (gastroesophageal reflux disease)    Hyperlipidemia    Hypertension    sees Dr. Posey Rea, stress test in 01/2011   Hypomagnesemia 08/10/2017   Hyponatremia 08/10/2017   Incisional  hernia    with obstruction   Myocardial infarction ([place]) 02/2008   Neuromuscular disorder ([place])    neuropathy   Osteoporosis    Shortness of breath    Status post insertion of drug eluting coronary artery stent 09/15/2018   Substance abuse ([place])    drugs and alcohol   Tobacco abuse 08/10/2017   Type II diabetes mellitus, well controlled ([place]) 05/06/8117   Uncomplicated opioid dependence ([place]) 08/10/2017   Unstable angina ([place]) 09/15/2018   Varicose veins of leg with complications 1/47/8295   Varicose veins of lower extremities with other complications 07/18/3084   Varicose veins of lower extremity 10/12/2013    Past Surgical History:  Procedure Laterality Date   CARDIAC CATHETERIZATION     02/2008- [facility], x2 stents    CARDIAC SURGERY     CHOLECYSTECTOMY     CORONARY STENT INTERVENTION N/A 08/16/2020   Procedure: CORONARY STENT INTERVENTION;  Surgeon: Wellington Hampshire, MD;  Location: [place] [facility] [facility];  Service: Cardiovascular;  Laterality: N/A;   INTRAVASCULAR IMAGING/OCT N/A 08/16/2020   Procedure: INTRAVASCULAR IMAGING/OCT;  Surgeon: Wellington Hampshire, MD;  Location: [place] [facility];  Service: Cardiovascular;  Laterality: N/A;   INTRAVASCULAR PRESSURE WIRE/FFR STUDY N/A 08/16/2020   Procedure: INTRAVASCULAR PRESSURE WIRE/FFR STUDY;  Surgeon: Wellington Hampshire, MD;  Location: [place] [facility];  Service: Cardiovascular;  Laterality: N/A;   LEFT  HEART CATH AND CORONARY ANGIOGRAPHY N/A 08/16/2020   Procedure: LEFT HEART CATH AND CORONARY ANGIOGRAPHY;  Surgeon: Wellington Hampshire, MD;  Location: [place] [facility];  Service: Cardiovascular;  Laterality: N/A;   MANDIBLE FRACTURE SURGERY     1980   TONSILLECTOMY AND ADENOIDECTOMY     TUBAL LIGATION     VENTRAL HERNIA REPAIR  07/12/2011   Procedure: LAPAROSCOPIC VENTRAL HERNIA;  Surgeon: Adin Hector, MD;  Location: [facility];  Service: General;  Laterality: N/A;  laparoscopic repair incisional hernia with mesh    Current  Medications: Current Meds  Medication Sig   albuterol (VENTOLIN HFA) 108 (90 Base) MCG/ACT inhaler Inhale 2 puffs into the lungs every 6 (six) hours as needed for wheezing or shortness of breath.   amLODipine (NORVASC) 2.5 MG tablet Take 2.5 mg by mouth daily.   Ascorbic Acid (VITAMIN C) 500 MG CAPS Take 1 capsule by mouth daily.   aspirin 81 MG tablet Take 81 mg by mouth daily.   atorvastatin (LIPITOR) 80 MG tablet TAKE 1 TABLET(80 MG) BY MOUTH DAILY   Buprenorphine HCl-Naloxone HCl 8-2 MG FILM Place 1 Film under the tongue 2 (two) times daily.   carvedilol (COREG) 6.25 MG tablet Take 6.25 mg by mouth 2 (two) times daily with a meal.   clonazePAM (KLONOPIN) 1 MG tablet Take 1 mg by mouth 2 (two) times daily.   clopidogrel (PLAVIX) 75 MG tablet TAKE 1 TABLET(75 MG) BY MOUTH DAILY   cyanocobalamin 1000 MCG tablet Take 4,000 mcg by mouth daily.   cyclobenzaprine (FLEXERIL) 10 MG tablet Take 10 mg by mouth 3 (three) times daily as needed for muscle spasms. For muscle pain   ezetimibe (ZETIA) 10 MG tablet TAKE 1 TABLET(10 MG) BY MOUTH DAILY   folic acid (FOLVITE) 403 MCG tablet Take 1 tablet by mouth daily.   gabapentin (NEURONTIN) 600 MG tablet Take 600 mg by mouth 4 (four) times daily.   hydrochlorothiazide (HYDRODIURIL) 12.5 MG tablet Take 12.5 mg by mouth daily.   isosorbide mononitrate (IMDUR) 60 MG 24 hr tablet Take 1 tablet (60 mg total) by mouth daily.   lisinopril (ZESTRIL) 20 MG tablet Take 20 mg by mouth 2 (two) times daily.   magnesium oxide (MAG-OX) 400 MG tablet Take 1 tablet by mouth 2 (two) times daily.   metFORMIN (GLUMETZA) 500 MG (MOD) 24 hr tablet Take 500 mg by mouth as needed (If glucose is elevated).   nicotine (NICODERM CQ - DOSED IN MG/24 HOURS) 14 mg/24hr patch Place 1 patch (14 mg total) onto the skin daily.   nicotine (NICODERM CQ - DOSED IN MG/24 HOURS) 21 mg/24hr patch Place 1 patch (21 mg total) onto the skin daily.   nicotine (NICODERM CQ - DOSED IN MG/24 HR) 7  mg/24hr patch Place 1 patch (7 mg total) onto the skin daily.   nitroGLYCERIN (NITROSTAT) 0.4 MG SL tablet Place 0.4 mg under the tongue every 5 (five) minutes as needed for chest pain.   pantoprazole (PROTONIX) 40 MG tablet Take 40 mg by mouth daily.   potassium gluconate 595 (99 K) MG TABS tablet Take 595 mg by mouth 2 (two) times daily.   ranolazine (RANEXA) 500 MG 12 hr tablet Take 1 tablet (500 mg total) by mouth 2 (two) times daily.   vitamin B-6 (PYRIDOXINE) 25 MG tablet Take 25 mg by mouth daily.     Allergies:   Septra [bactrim] and Sulfamethoxazole-trimethoprim   Social History   Socioeconomic History  Marital status: Married    Spouse name: Not on file   Number of children: Not on file   Years of education: Not on file   Highest education level: Not on file  Occupational History   Not on file  Tobacco Use   Smoking status: Every Day    Packs/day: 1.00    Years: 28.00    Pack years: 28.00    Types: Cigarettes   Smokeless tobacco: Never  Vaping Use   Vaping Use: Never used  Substance and Sexual Activity   Alcohol use: Yes    Alcohol/week: 6.0 standard drinks    Types: 6 Cans of beer per week    Comment: 6 pack of beer / week   Drug use: No    Types: Heroin    Comment: 1.5 yr. use of Heroin- 1990's    Sexual activity: Not Currently  Other Topics Concern   Not on file  Social History Narrative   Not on file   Social Determinants of Health   Financial Resource Strain: Not on file  Food Insecurity: Not on file  Transportation Needs: Not on file  Physical Activity: Not on file  Stress: Not on file  Social Connections: Not on file     Family History: The patient's family history includes Cancer in her father; Diabetes in her mother; Heart disease in her mother; Hyperlipidemia in her mother; Hypertension in her mother. There is no history of Anesthesia problems. ROS:   Please see the history of present illness.    All 14 point review of systems negative  except as described per history of present illness  EKGs/Labs/Other Studies Reviewed:      Recent Labs: 09/07/2020: BUN 9; Creatinine, Ser 0.69; Hemoglobin 9.1; Platelets 191; Potassium 3.9; Sodium 128  Recent Lipid Panel    Component Value Date/Time   CHOL 134 12/31/2019 1440   TRIG 77 12/31/2019 1440   HDL 38 (L) 12/31/2019 1440   CHOLHDL 3.5 12/31/2019 1440   LDLCALC 81 12/31/2019 1440    Physical Exam:    VS:  BP 90/60 (BP Location: Left Arm, Patient Position: Sitting, Cuff Size: Normal)    Pulse 64    Ht 5' 4.5" (1.638 m)    Wt 150 lb (68 kg)    SpO2 94%    BMI 25.35 kg/m     Wt Readings from Last 3 Encounters:  03/19/21 150 lb (68 kg)  09/12/20 142 lb 12.8 oz (64.8 kg)  09/07/20 148 lb (67.1 kg)     GEN:  Well nourished, well developed in no acute distress HEENT: Normal NECK: No JVD; No carotid bruits LYMPHATICS: No lymphadenopathy CARDIAC: RRR, no murmurs, no rubs, no gallops RESPIRATORY:  Clear to auscultation without rales, wheezing or rhonchi  ABDOMEN: Soft, non-tender, non-distended MUSCULOSKELETAL:  No edema; No deformity  SKIN: Warm and dry LOWER EXTREMITIES: no swelling NEUROLOGIC:  Alert and oriented x 3 PSYCHIATRIC:  Normal affect   ASSESSMENT:    1. Coronary artery disease involving native coronary artery of native heart without angina pectoris   2. Essential hypertension   3. Primary hypertension   4. Smoking    PLAN:    In order of problems listed above:  Coronary disease stable from that point review and appropriate medication which include antiplatelets therapy.  She is actually on dual antiplatelets therapy which I decided to continue and definitely because of frequent recurrences of coronary artery disease with poorly controlled risk factors namely smoking. Essential hypertension blood pressure  seems to be low today was getting ready to cut down some of her medication, however, she told me that when she checked her blood pressure at home is  usually 1 51-1 40 systolic.  We will continue monitoring. Dyslipidemia I did review her K PN which show me her LDL of 37 HDL 48 this is from November 2022.  She is on high intense statin form of Lipitor 80 as well as Zetia 10 which I will continue. Smoking obviously huge problem again we spent a great of time talking about need to quit she is determined that she wants to quit and she wants to do it with her husband.  They just have to set up the date to do it.  She does have nicotine patch and I encouraged her to use it if needed.   Medication Adjustments/Labs and Tests Ordered: Current medicines are reviewed at length with the patient today.  Concerns regarding medicines are outlined above.  No orders of the defined types were placed in this encounter.  Medication changes: No orders of the defined types were placed in this encounter.   Signed, Park Liter, MD, [facility] 03/19/2021 1:58 PM    [place]

## 2021-03-22 DIAGNOSIS — M5136 Other intervertebral disc degeneration, lumbar region: Secondary | ICD-10-CM | POA: Diagnosis not present

## 2021-03-22 DIAGNOSIS — I2510 Atherosclerotic heart disease of native coronary artery without angina pectoris: Secondary | ICD-10-CM | POA: Diagnosis not present

## 2021-03-22 DIAGNOSIS — M25552 Pain in left hip: Secondary | ICD-10-CM | POA: Diagnosis not present

## 2021-03-22 DIAGNOSIS — M5442 Lumbago with sciatica, left side: Secondary | ICD-10-CM | POA: Diagnosis not present

## 2021-03-22 DIAGNOSIS — M7912 Myalgia of auxiliary muscles, head and neck: Secondary | ICD-10-CM | POA: Diagnosis not present

## 2021-03-22 DIAGNOSIS — M4726 Other spondylosis with radiculopathy, lumbar region: Secondary | ICD-10-CM | POA: Diagnosis not present

## 2021-03-22 DIAGNOSIS — G8929 Other chronic pain: Secondary | ICD-10-CM | POA: Diagnosis not present

## 2021-03-22 DIAGNOSIS — J449 Chronic obstructive pulmonary disease, unspecified: Secondary | ICD-10-CM | POA: Diagnosis not present

## 2021-03-22 DIAGNOSIS — E119 Type 2 diabetes mellitus without complications: Secondary | ICD-10-CM | POA: Diagnosis not present

## 2021-03-22 DIAGNOSIS — I10 Essential (primary) hypertension: Secondary | ICD-10-CM | POA: Diagnosis not present

## 2021-03-22 DIAGNOSIS — M47816 Spondylosis without myelopathy or radiculopathy, lumbar region: Secondary | ICD-10-CM | POA: Diagnosis not present

## 2021-03-22 DIAGNOSIS — M1612 Unilateral primary osteoarthritis, left hip: Secondary | ICD-10-CM | POA: Diagnosis not present

## 2021-03-22 DIAGNOSIS — M48061 Spinal stenosis, lumbar region without neurogenic claudication: Secondary | ICD-10-CM | POA: Diagnosis not present

## 2021-03-26 ENCOUNTER — Other Ambulatory Visit: Payer: Self-pay

## 2021-03-26 ENCOUNTER — Encounter: Payer: Self-pay | Admitting: Hematology and Oncology

## 2021-03-26 ENCOUNTER — Inpatient Hospital Stay: Payer: Medicare HMO | Attending: Hematology and Oncology | Admitting: Hematology and Oncology

## 2021-03-26 ENCOUNTER — Other Ambulatory Visit: Payer: Self-pay | Admitting: Hematology and Oncology

## 2021-03-26 ENCOUNTER — Inpatient Hospital Stay: Payer: Medicare HMO

## 2021-03-26 VITALS — BP 139/66 | HR 59 | Temp 98.9°F | Resp 20 | Ht 64.0 in | Wt 151.0 lb

## 2021-03-26 DIAGNOSIS — Z79899 Other long term (current) drug therapy: Secondary | ICD-10-CM | POA: Diagnosis not present

## 2021-03-26 DIAGNOSIS — E119 Type 2 diabetes mellitus without complications: Secondary | ICD-10-CM | POA: Diagnosis not present

## 2021-03-26 DIAGNOSIS — Z7901 Long term (current) use of anticoagulants: Secondary | ICD-10-CM | POA: Diagnosis not present

## 2021-03-26 DIAGNOSIS — E785 Hyperlipidemia, unspecified: Secondary | ICD-10-CM | POA: Diagnosis not present

## 2021-03-26 DIAGNOSIS — K219 Gastro-esophageal reflux disease without esophagitis: Secondary | ICD-10-CM | POA: Diagnosis not present

## 2021-03-26 DIAGNOSIS — D509 Iron deficiency anemia, unspecified: Secondary | ICD-10-CM | POA: Diagnosis not present

## 2021-03-26 DIAGNOSIS — J449 Chronic obstructive pulmonary disease, unspecified: Secondary | ICD-10-CM | POA: Insufficient documentation

## 2021-03-26 DIAGNOSIS — F32A Depression, unspecified: Secondary | ICD-10-CM | POA: Insufficient documentation

## 2021-03-26 DIAGNOSIS — Z9189 Other specified personal risk factors, not elsewhere classified: Secondary | ICD-10-CM

## 2021-03-26 DIAGNOSIS — D508 Other iron deficiency anemias: Secondary | ICD-10-CM | POA: Diagnosis not present

## 2021-03-26 DIAGNOSIS — F5089 Other specified eating disorder: Secondary | ICD-10-CM | POA: Diagnosis not present

## 2021-03-26 DIAGNOSIS — R911 Solitary pulmonary nodule: Secondary | ICD-10-CM | POA: Diagnosis not present

## 2021-03-26 DIAGNOSIS — F1120 Opioid dependence, uncomplicated: Secondary | ICD-10-CM | POA: Insufficient documentation

## 2021-03-26 DIAGNOSIS — K7460 Unspecified cirrhosis of liver: Secondary | ICD-10-CM | POA: Diagnosis not present

## 2021-03-26 DIAGNOSIS — Z7982 Long term (current) use of aspirin: Secondary | ICD-10-CM | POA: Insufficient documentation

## 2021-03-26 DIAGNOSIS — I10 Essential (primary) hypertension: Secondary | ICD-10-CM | POA: Diagnosis not present

## 2021-03-26 DIAGNOSIS — I2510 Atherosclerotic heart disease of native coronary artery without angina pectoris: Secondary | ICD-10-CM | POA: Insufficient documentation

## 2021-03-26 DIAGNOSIS — R5383 Other fatigue: Secondary | ICD-10-CM | POA: Insufficient documentation

## 2021-03-26 DIAGNOSIS — F419 Anxiety disorder, unspecified: Secondary | ICD-10-CM | POA: Insufficient documentation

## 2021-03-26 DIAGNOSIS — R0602 Shortness of breath: Secondary | ICD-10-CM | POA: Insufficient documentation

## 2021-03-26 DIAGNOSIS — F1010 Alcohol abuse, uncomplicated: Secondary | ICD-10-CM | POA: Insufficient documentation

## 2021-03-26 DIAGNOSIS — F17200 Nicotine dependence, unspecified, uncomplicated: Secondary | ICD-10-CM

## 2021-03-26 DIAGNOSIS — G8929 Other chronic pain: Secondary | ICD-10-CM | POA: Insufficient documentation

## 2021-03-26 DIAGNOSIS — F17210 Nicotine dependence, cigarettes, uncomplicated: Secondary | ICD-10-CM | POA: Diagnosis not present

## 2021-03-26 DIAGNOSIS — F1111 Opioid abuse, in remission: Secondary | ICD-10-CM | POA: Diagnosis not present

## 2021-03-26 DIAGNOSIS — D649 Anemia, unspecified: Secondary | ICD-10-CM | POA: Diagnosis not present

## 2021-03-26 DIAGNOSIS — I252 Old myocardial infarction: Secondary | ICD-10-CM | POA: Insufficient documentation

## 2021-03-26 DIAGNOSIS — M549 Dorsalgia, unspecified: Secondary | ICD-10-CM | POA: Insufficient documentation

## 2021-03-26 DIAGNOSIS — Z7984 Long term (current) use of oral hypoglycemic drugs: Secondary | ICD-10-CM | POA: Insufficient documentation

## 2021-03-26 LAB — CBC AND DIFFERENTIAL
HCT: 33 — AB (ref 36–46)
Hemoglobin: 10.4 — AB (ref 12.0–16.0)
Neutrophils Absolute: 2.18
Platelets: 209 (ref 150–399)
WBC: 3.9

## 2021-03-26 LAB — COMPREHENSIVE METABOLIC PANEL
Albumin: 4.4 (ref 3.5–5.0)
Calcium: 8.8 (ref 8.7–10.7)

## 2021-03-26 LAB — IRON AND TIBC
Iron: 20 ug/dL — ABNORMAL LOW (ref 28–170)
Saturation Ratios: 4 % — ABNORMAL LOW (ref 10.4–31.8)
TIBC: 486 ug/dL — ABNORMAL HIGH (ref 250–450)
UIBC: 466 ug/dL

## 2021-03-26 LAB — CBC
MCV: 70 — AB (ref 81–99)
RBC: 4.71 (ref 3.87–5.11)

## 2021-03-26 LAB — BASIC METABOLIC PANEL
BUN: 12 (ref 4–21)
CO2: 32 — AB (ref 13–22)
Chloride: 98 — AB (ref 99–108)
Creatinine: 0.8 (ref 0.5–1.1)
Glucose: 135
Potassium: 3.9 (ref 3.4–5.3)
Sodium: 135 — AB (ref 137–147)

## 2021-03-26 LAB — HEPATIC FUNCTION PANEL
ALT: 17 (ref 7–35)
AST: 38 — AB (ref 13–35)
Alkaline Phosphatase: 77 (ref 25–125)
Bilirubin, Total: 0.5

## 2021-03-26 LAB — FOLATE: Folate: 33.6 ng/mL (ref >5.9)

## 2021-03-26 LAB — VITAMIN B12: Vitamin B-12: 3617 pg/mL — ABNORMAL HIGH (ref 180–914)

## 2021-03-26 LAB — FERRITIN: Ferritin: 3 ng/mL — ABNORMAL LOW (ref 11–307)

## 2021-03-26 MED ORDER — ONDANSETRON HCL 4 MG PO TABS: 4.0000 mg | ORAL_TABLET | Freq: Three times a day (TID) | ORAL | 2 refills | Status: DC | PRN

## 2021-03-26 NOTE — Progress Notes (Signed)
[facility]  [address] [place],  [place]  [address] [phone]  Clinic Day:  03/26/2021  Referring physician: Carvel Getting Key, *   REASON FOR CONSULTATION:  Iron deficiency anemia  HISTORY OF PRESENT ILLNESS:  Makayla Fischer is a 61 y.o. female with a iron deficiency anemia who is referred in consultation by Carvel Getting, NP for assessment and management.  The patient had chronic anemia with her hemoglobin running between 10 and 11 since July 2021 in her office.  Her hemoglobin dropped to 9.4 in November 2022.  Iron studies were obtained on February 20th and revealed a serum iron of 15, total iron binding capacity of 442, iron saturation of 3% and ferritin 7, which is consistent with iron deficiency.  She states she was given a prescription for Slow Fe, but is afraid to try it as she had severe nausea and vomiting with oral iron in the past.  She states she previously had IV iron at [facility], but does not recall when or how often.  She reports fatigue, shortness of breath with exertion and pica to ice.  She has had bright red blood per rectum, which she attributes to hemorrhoids.  She denies melena.  She reports difficulty swallowing for about 2 years, but has not had any evaluation.  Her last colonoscopy was with Dr. Melina Copa in 2016 and she had removal of polyps.  She states is being referred to Dr. Lyda Jester but she does not have that appointment yet.  I asked her to check with Ms. Foreman's office to be sure that is scheduled.   She has an extensive medical history including diabetes mellitus, GERD, hypertension, hyperlipidemia, coronary artery disease with angina-she has had 2 stents coronary artery placed, COPD, cirrhosis, anxiety, depression and chronic back pain.  She also has a history of illicit drug abuse for which she is on buprenorphine through Dr. Megan Salon.  She underwent cholecystectomy in March 2021.  A liver biopsy was done at  the time of surgery and revealed cirrhosis.  Her hemoglobin was 7.6 on admission for cholecystectomy.  She was transfused 2 packed red blood cells.  Her hemoglobin was 10.9 on discharge.  She was seen in the emergency room at [facility] in July 2022 and her hemoglobin was as low as 8.7.  She was admitted in September with multiple complaints, at which time her hemoglobin was 10.4.   She has not had a mammogram since January 2020.  REVIEW OF SYSTEMS:  Review of Systems  Constitutional:  Positive for fatigue. Negative for appetite change, chills, fever and unexpected weight change.  HENT:   Negative for lump/mass, mouth sores and sore throat.   Respiratory:  Positive for shortness of breath. Negative for cough.   Cardiovascular:  Negative for chest pain and leg swelling.  Gastrointestinal:  Negative for abdominal pain, constipation, diarrhea, nausea and vomiting.  Endocrine: Negative for hot flashes.  Genitourinary:  Negative for difficulty urinating, dysuria, frequency and hematuria.   Musculoskeletal:  Negative for arthralgias, back pain and myalgias.  Skin:  Negative for rash.  Neurological:  Negative for dizziness and headaches.  Hematological:  Negative for adenopathy. Does not bruise/bleed easily.  Psychiatric/Behavioral:  Positive for depression and sleep disturbance. The patient is nervous/anxious.     VITALS:  Blood pressure 139/66, pulse (!) 59, temperature 98.9 F (37.2 C), temperature source Oral, resp. rate 20, height 5\' 4"  (1.626 m), weight 151 lb (68.5 kg), SpO2 97 %.  Wt  Readings from Last 3 Encounters:  03/26/21 151 lb (68.5 kg)  03/19/21 150 lb (68 kg)  09/12/20 142 lb 12.8 oz (64.8 kg)    Body mass index is 25.92 kg/m.  Performance status (ECOG): 1 - Symptomatic but completely ambulatory  PHYSICAL EXAM:  Physical Exam Vitals and nursing note reviewed.  Constitutional:      General: She is not in acute distress.    Appearance: Normal appearance.  HENT:      Head: Normocephalic and atraumatic.     Mouth/Throat:     Mouth: Mucous membranes are moist.     Pharynx: Oropharynx is clear. No oropharyngeal exudate or posterior oropharyngeal erythema.  Eyes:     General: No scleral icterus.    Extraocular Movements: Extraocular movements intact.     Conjunctiva/sclera: Conjunctivae normal.     Pupils: Pupils are equal, round, and reactive to light.  Cardiovascular:     Rate and Rhythm: Normal rate and regular rhythm.     Heart sounds: Normal heart sounds. No murmur heard.   No friction rub. No gallop.  Pulmonary:     Effort: Pulmonary effort is normal.     Breath sounds: Normal breath sounds. No wheezing, rhonchi or rales.  Abdominal:     General: There is no distension.     Palpations: Abdomen is soft. There is no hepatomegaly, splenomegaly or mass.     Tenderness: There is no abdominal tenderness.  Musculoskeletal:        General: Normal range of motion.     Cervical back: Normal range of motion and neck supple. No tenderness.     Right lower leg: No edema.     Left lower leg: No edema.  Lymphadenopathy:     Cervical: No cervical adenopathy.     Upper Body:     Right upper body: No supraclavicular or axillary adenopathy.     Left upper body: No supraclavicular or axillary adenopathy.     Lower Body: No right inguinal adenopathy. No left inguinal adenopathy.  Skin:    General: Skin is warm and dry.     Coloration: Skin is not jaundiced.     Findings: No rash.  Neurological:     Mental Status: She is alert and oriented to person, place, and time.     Cranial Nerves: No cranial nerve deficit.  Psychiatric:        Mood and Affect: Mood normal.        Behavior: Behavior normal.        Thought Content: Thought content normal.    LABS:   CBC Latest Ref Rng & Units 03/26/2021 09/07/2020 08/17/2020  WBC - 3.9 4.4 4.0  Hemoglobin 12.0 - 16.0 10.4(A) 9.1(L) 8.6(L)  Hematocrit 36 - 46 33(A) 29.4(L) 28.8(L)  Platelets 150 - 399 209 191 172    CMP Latest Ref Rng & Units 03/26/2021 09/07/2020 08/17/2020  Glucose 70 - 99 mg/dL - 111(H) 122(H)  BUN 4 - 21 12 9 11   Creatinine 0.5 - 1.1 0.8 0.69 0.59  Sodium 137 - 147 135(A) 128(L) 134(L)  Potassium 3.4 - 5.3 3.9 3.9 4.2  Chloride 99 - 108 98(A) 90(L) 98  CO2 13 - 22 32(A) 29 28  Calcium 8.7 - 10.7 8.8 8.9 8.8(L)  Total Protein 6.0 - 8.3 g/dL - - -  Total Bilirubin 0.3 - 1.2 mg/dL - - -  Alkaline Phos 25 - 125 77 - -  AST 13 - 35 38(A) - -  ALT  7 - 35 17 - -     No results found for: CEA1 / No results found for: CEA1 No results found for: PSA1 No results found for: YYT035 No results found for: CAN125  No results found for: TOTALPROTELP, ALBUMINELP, A1GS, A2GS, BETS, BETA2SER, GAMS, MSPIKE, SPEI Lab Results  Component Value Date   TIBC 486 (H) 03/26/2021   FERRITIN 3 (L) 03/26/2021   IRONPCTSAT 4 (L) 03/26/2021   No results found for: LDH  STUDIES:  No results found.    HISTORY:   Past Medical History:  Diagnosis Date   Adnexal mass 09/06/2019   Anginal pain (HCC)    Anxiety    h/o panic attacks    Arthritis    all over, back, knees    CAD (coronary artery disease)    Chronic coronary artery disease 08/10/2017   Chronic pain    COPD (chronic obstructive pulmonary disease) ([place])    Coronary disease PTCA and stenting with drug-eluting stent to circumflex artery in August 2020 08/10/2017   Daily consumption of alcohol 08/10/2017   Depression    Dyslipidemia 04/28/2019   Essential hypertension 08/10/2017   Fatty liver    by 01/15/11 CT scan   Fibromyalgia    GERD (gastroesophageal reflux disease)    Hyperlipidemia    Hypertension    sees Dr. Posey Rea, stress test in 01/2011   Hypomagnesemia 08/10/2017   Hyponatremia 08/10/2017   Incisional hernia    with obstruction   Myocardial infarction ([place]) 02/2008   Neuromuscular disorder ([place])    neuropathy   Osteoporosis    Shortness of breath    Status post insertion of drug eluting coronary artery  stent 09/15/2018   Substance abuse ([place])    drugs and alcohol   Tobacco abuse 08/10/2017   Type II diabetes mellitus, well controlled ([place]) 46/56/8127   Uncomplicated opioid dependence ([place]) 08/10/2017   Unstable angina ([place]) 09/15/2018   Varicose veins of leg with complications 51/70/0174   Varicose veins of lower extremities with other complications 94/49/6759   Varicose veins of lower extremity 10/12/2013    Past Surgical History:  Procedure Laterality Date   CARDIAC CATHETERIZATION     02/2008- [facility]a, x2 stents    CARDIAC SURGERY     CHOLECYSTECTOMY     CORONARY STENT INTERVENTION N/A 08/16/2020   Procedure: CORONARY STENT INTERVENTION;  Surgeon: Wellington Hampshire, MD;  Location: [facility];  Service: Cardiovascular;  Laterality: N/A;   INTRAVASCULAR IMAGING/OCT N/A 08/16/2020   Procedure: INTRAVASCULAR IMAGING/OCT;  Surgeon: Wellington Hampshire, MD;  Location: [facility];  Service: Cardiovascular;  Laterality: N/A;   INTRAVASCULAR PRESSURE WIRE/FFR STUDY N/A 08/16/2020   Procedure: INTRAVASCULAR PRESSURE WIRE/FFR STUDY;  Surgeon: Wellington Hampshire, MD;  Location: [facility];  Service: Cardiovascular;  Laterality: N/A;   LEFT HEART CATH AND CORONARY ANGIOGRAPHY N/A 08/16/2020   Procedure: LEFT HEART CATH AND CORONARY ANGIOGRAPHY;  Surgeon: Wellington Hampshire, MD;  Location: [facility];  Service: Cardiovascular;  Laterality: N/A;   MANDIBLE FRACTURE SURGERY     1980   TONSILLECTOMY AND ADENOIDECTOMY     TUBAL LIGATION     VENTRAL HERNIA REPAIR  07/12/2011   Procedure: LAPAROSCOPIC VENTRAL HERNIA;  Surgeon: Adin Hector, MD;  Location: [facility];  Service: General;  Laterality: N/A;  laparoscopic repair incisional hernia with mesh    Family History  Problem Relation Age of Onset   Diabetes Mother    Hyperlipidemia  Mother    Hypertension Mother    Heart disease Mother    Pancreatitis Mother    Cancer Father        lymphoma   COPD Father     Throat cancer Maternal Uncle    Colon cancer Paternal Uncle    Anesthesia problems Neg Hx     Social History:  reports that she has been smoking cigarettes. She has a 40.00 pack-year smoking history. She has never used smokeless tobacco. She reports that she does not currently use alcohol after a past usage of about 21.0 standard drinks per week. She reports that she does not use drugs.The patient is alone today.  She is married with 2 children, ages 92 and 39, and a step son age 31.  She was born and raised in [place], mainly in the [place] area.  She has worked many jobs in Scientist, research (medical), Architect and most recently Insurance underwriter.  She is currently disabled.  Allergies:  Allergies  Allergen Reactions   Septra [Bactrim] Shortness Of Breath and Itching   Sulfamethoxazole-Trimethoprim Itching, Shortness Of Breath, Anaphylaxis and Other (See Comments)    Current Medications: Current Outpatient Medications  Medication Sig Dispense Refill   alendronate (FOSAMAX) 35 MG tablet TAKE 1 TABLET POO EVERY WEEK     ondansetron (ZOFRAN) 4 MG tablet Take 1 tablet (4 mg total) by mouth every 8 (eight) hours as needed for nausea or vomiting. 30 tablet 2   albuterol (VENTOLIN HFA) 108 (90 Base) MCG/ACT inhaler Inhale 2 puffs into the lungs every 6 (six) hours as needed for wheezing or shortness of breath.     amLODipine (NORVASC) 2.5 MG tablet Take by mouth.     Ascorbic Acid (VITAMIN C) 500 MG CAPS Take 1 capsule by mouth daily.     aspirin 81 MG tablet Take 81 mg by mouth daily.     atorvastatin (LIPITOR) 80 MG tablet TAKE 1 TABLET(80 MG) BY MOUTH DAILY 90 tablet 3   Buprenorphine HCl-Naloxone HCl 8-2 MG FILM Place 1 Film under the tongue 2 (two) times daily.     busPIRone (BUSPAR) 15 MG tablet Take by mouth.     carvedilol (COREG) 6.25 MG tablet Take 6.25 mg by mouth 2 (two) times daily with a meal.     clonazePAM (KLONOPIN) 1 MG tablet Take 1 mg by mouth 2 (two) times daily.     clopidogrel  (PLAVIX) 75 MG tablet TAKE 1 TABLET(75 MG) BY MOUTH DAILY 90 tablet 3   cyanocobalamin 1000 MCG tablet Take 4,000 mcg by mouth daily.     cyclobenzaprine (FLEXERIL) 10 MG tablet Take 10 mg by mouth 3 (three) times daily as needed for muscle spasms. For muscle pain     escitalopram (LEXAPRO) 10 MG tablet Take 10 mg by mouth daily.     ezetimibe (ZETIA) 10 MG tablet TAKE 1 TABLET(10 MG) BY MOUTH DAILY 90 tablet 2   folic acid (FOLVITE) 161 MCG tablet Take 1 tablet by mouth daily.     gabapentin (NEURONTIN) 600 MG tablet Take 600 mg by mouth 4 (four) times daily.     hydrochlorothiazide (HYDRODIURIL) 12.5 MG tablet Take 12.5 mg by mouth daily.     isosorbide mononitrate (IMDUR) 60 MG 24 hr tablet Take 1 tablet (60 mg total) by mouth daily. 30 tablet 0   lisinopril (ZESTRIL) 20 MG tablet Take 20 mg by mouth 2 (two) times daily.     magnesium oxide (MAG-OX) 400 MG tablet Take 1 tablet by  mouth 2 (two) times daily.     metFORMIN (GLUCOPHAGE) 500 MG tablet Take 500 mg by mouth 2 (two) times daily.     naloxone (NARCAN) nasal spray 4 mg/0.1 mL See admin instructions.     nicotine (NICODERM CQ - DOSED IN MG/24 HOURS) 14 mg/24hr patch Place 1 patch (14 mg total) onto the skin daily. (Patient not taking: Reported on 03/26/2021) 14 patch 0   nicotine (NICODERM CQ - DOSED IN MG/24 HOURS) 21 mg/24hr patch Place 1 patch (21 mg total) onto the skin daily. (Patient not taking: Reported on 03/26/2021) 14 patch 0   nicotine (NICODERM CQ - DOSED IN MG/24 HR) 7 mg/24hr patch Place 1 patch (7 mg total) onto the skin daily. (Patient not taking: Reported on 03/26/2021) 14 patch 0   nitroGLYCERIN (NITROSTAT) 0.4 MG SL tablet Place 0.4 mg under the tongue every 5 (five) minutes as needed for chest pain.     pantoprazole (PROTONIX) 40 MG tablet Take 40 mg by mouth daily.     potassium gluconate 595 (99 K) MG TABS tablet Take 595 mg by mouth 2 (two) times daily.     ranolazine (RANEXA) 500 MG 12 hr tablet Take 1 tablet (500 mg  total) by mouth 2 (two) times daily. 180 tablet 3   SLOW FE 142 (45 Fe) MG TBCR Take 1 tablet by mouth 2 (two) times daily.     vitamin B-6 (PYRIDOXINE) 25 MG tablet Take 25 mg by mouth daily.     No current facility-administered medications for this visit.     ASSESSMENT & PLAN:   Assessment:   Iron deficiency anemia of uncertain etiology.  I would like her to see Dr. Lyda Jester for EGD and colonoscopy as soon as possible.   Tobacco abuse.  She is a current smoker with approximately 40 pack year history of smoking.   Alcohol abuse with biopsy-proven cirrhosis.  She currently drinks 3 hard lemonades per day.  Plan:    I will arrange for her to have IV iron in the form of Feraheme early next week.  I advised her to contact us [facility] office regarding the referral to Dr. Lyda Jester.  I will then plan to see her back in 6 weeks for repeat clinical assessment. We discussed smoking cessation and she states that she and her husband are planning to quit now.  I initially recommended low-dose CT chest for lung cancer screening, but upon further review of her records, CTA chest, abdomen and pelvis in October 2021 done for chest pain revealed an incidental finding of a 3 mm right upper lobe pulmonary nodule.  I therefore recommended she undergo CT chest without contrast to reevaluate the pulmonary nodule. I advised to stop alcohol use immediately to avoid further liver damage.  She does not seem concerned about her drinking.   I discussed the assessment and treatment plan with the patient.  The patient was provided an opportunity to ask questions and all were answered.  The patient agreed with the plan and demonstrated an understanding of the instructions.  The patient was advised to call back if the symptoms worsen or if the condition fails to improve as anticipated.  Thank you for the opportunity to care for this lovely woman.     Marvia Pickles, PA-C

## 2021-03-27 ENCOUNTER — Encounter: Payer: Self-pay | Admitting: Hematology and Oncology

## 2021-03-27 DIAGNOSIS — E782 Mixed hyperlipidemia: Secondary | ICD-10-CM | POA: Diagnosis not present

## 2021-03-27 DIAGNOSIS — I2510 Atherosclerotic heart disease of native coronary artery without angina pectoris: Secondary | ICD-10-CM | POA: Diagnosis not present

## 2021-03-27 DIAGNOSIS — I10 Essential (primary) hypertension: Secondary | ICD-10-CM | POA: Diagnosis not present

## 2021-03-27 DIAGNOSIS — J449 Chronic obstructive pulmonary disease, unspecified: Secondary | ICD-10-CM | POA: Diagnosis not present

## 2021-03-28 ENCOUNTER — Encounter: Payer: Self-pay | Admitting: Hematology and Oncology

## 2021-03-29 ENCOUNTER — Telehealth: Payer: Self-pay | Admitting: Hematology and Oncology

## 2021-03-29 NOTE — Telephone Encounter (Signed)
Spoke with patient this morning to notify her that the CT Chest was not approved through her insurance, they are requesting she get a Chest XR prior. . Order has been faxed to [facility] and notified patient she could go at any time to get that done. ?

## 2021-03-30 ENCOUNTER — Encounter: Payer: Self-pay | Admitting: Hematology and Oncology

## 2021-03-30 DIAGNOSIS — R0602 Shortness of breath: Secondary | ICD-10-CM | POA: Diagnosis not present

## 2021-03-30 DIAGNOSIS — R918 Other nonspecific abnormal finding of lung field: Secondary | ICD-10-CM | POA: Diagnosis not present

## 2021-03-30 DIAGNOSIS — R531 Weakness: Secondary | ICD-10-CM | POA: Diagnosis not present

## 2021-03-30 DIAGNOSIS — R079 Chest pain, unspecified: Secondary | ICD-10-CM | POA: Diagnosis not present

## 2021-03-30 DIAGNOSIS — F17210 Nicotine dependence, cigarettes, uncomplicated: Secondary | ICD-10-CM | POA: Diagnosis not present

## 2021-03-30 DIAGNOSIS — R911 Solitary pulmonary nodule: Secondary | ICD-10-CM | POA: Diagnosis not present

## 2021-04-03 ENCOUNTER — Inpatient Hospital Stay: Payer: Medicare HMO | Attending: Hematology and Oncology

## 2021-04-03 ENCOUNTER — Other Ambulatory Visit: Payer: Self-pay

## 2021-04-03 ENCOUNTER — Encounter: Payer: Self-pay | Admitting: Hematology and Oncology

## 2021-04-03 VITALS — BP 114/62 | HR 56 | Temp 98.9°F | Resp 18 | Ht 64.0 in | Wt 146.0 lb

## 2021-04-03 DIAGNOSIS — D509 Iron deficiency anemia, unspecified: Secondary | ICD-10-CM | POA: Diagnosis not present

## 2021-04-03 MED ORDER — FAMOTIDINE IN NACL 20-0.9 MG/50ML-% IV SOLN
20.0000 mg | Freq: Once | INTRAVENOUS | Status: AC
  Administered 2021-04-03: 20 mg via INTRAVENOUS
  Filled 2021-04-03: qty 50

## 2021-04-03 MED ORDER — SODIUM CHLORIDE 0.9 % IV SOLN: Freq: Once | INTRAVENOUS | Status: AC

## 2021-04-03 MED ORDER — ACETAMINOPHEN 325 MG PO TABS
650.0000 mg | ORAL_TABLET | Freq: Once | ORAL | Status: AC
  Administered 2021-04-03: 650 mg via ORAL
  Filled 2021-04-03: qty 2

## 2021-04-03 MED ORDER — SODIUM CHLORIDE 0.9 % IV SOLN
510.0000 mg | Freq: Once | INTRAVENOUS | Status: AC
  Administered 2021-04-03: 510 mg via INTRAVENOUS
  Filled 2021-04-03: qty 510

## 2021-04-03 NOTE — Patient Instructions (Signed)
Iron Deficiency Anemia, Adult Iron deficiency anemia is when you do not have enough red blood cells or hemoglobin in your blood. This happens because you have too little iron in your body. Hemoglobin carries oxygen to parts of the body. Anemia can cause your body to not get enough oxygen. What are the causes? Not eating enough foods that have iron in them. The body not being able to take in iron well. Needing more iron due to pregnancy or heavy menstrual periods, for females. Cancer. Bleeding in the bowels. Many blood draws. What increases the risk? Being pregnant. Being a teenage girl going through a growth spurt. What are the signs or symptoms? Pale skin, lips, and nails. Weakness, dizziness, and getting tired easily. Headache. Feeling like you cannot breathe well when moving (shortness of breath). Cold hands and feet. Fast heartbeat or a heartbeat that is not regular. Feeling grouchy (irritable) or breathing fast. These are more common in very bad anemia. Mild anemia may not cause any symptoms. How is this treated? This condition is treated by finding out why you do not have enough iron and then getting more iron. It may include: Adding foods to your diet that have a lot of iron. Taking iron pills (supplements). If you are pregnant or breastfeeding, you may need to take extra iron. Your diet often does not provide the amount of iron that you need. Getting more vitamin C in your diet. Vitamin C helps your body take in iron. You may need to take iron pills with a glass of orange juice or vitamin C pills. Medicines to make heavy menstrual periods lighter. Surgery. You may need blood tests to see if treatment is working. If the treatment does not seem to be working, you may need more tests. Follow these instructions at home: Medicines Take over-the-counter and prescription medicines only as told by your doctor. This includes iron pills and vitamins. Take iron pills when your stomach is  empty. If you cannot handle this, take them with food. Do not drink milk or take antacids at the same time as your iron pills. Iron pills may turn your poop (stool)black. If you cannot handle taking iron pills by mouth, ask your doctor about getting iron through: An IV tube. A shot (injection) into a muscle. Eating and drinking  Talk with your doctor before changing the foods you eat. He or she may tell you to eat foods that have a lot of iron, such as: Liver. Low-fat (lean) beef. Breads and cereals that have iron added to them. Eggs. Dried fruit. Dark green, leafy vegetables. Eat fresh fruits and vegetables that are high in vitamin C. They help your body use iron. Foods with a lot of vitamin C include: Oranges. Peppers. Tomatoes. Mangoes. Drink enough fluid to keep your pee (urine) pale yellow. Managing constipation If you are taking iron pills, they may cause trouble pooping (constipation). To prevent or treat trouble pooping, you may need to: Take over-the-counter or prescription medicines. Eat foods that are high in fiber. These include beans, whole grains, and fresh fruits and vegetables. Limit foods that are high in fat and sugar. These include fried or sweet foods. General instructions Return to your normal activities as told by your doctor. Ask your doctor what activities are safe for you. Keep yourself clean, and keep things clean around you. Keep all follow-up visits as told by your doctor. This is important. Contact a doctor if: You feel like you may vomit (nauseous), or you vomit. You  feel weak. You are sweating for no reason. You have trouble pooping, such as: Pooping less than 3 times a week. Straining to poop. Having poop that is hard, dry, or larger than normal. Feeling full or bloated. Pain in the lower belly. Not feeling better after pooping. Get help right away if: You pass out (faint). You have chest pain. You have trouble breathing that: Is very  bad. Gets worse with physical activity. You have a fast heartbeat, or a heartbeat that does not feel regular. You get light-headed when getting up from sitting or lying down. These symptoms may be an emergency. Do not wait to see if the symptoms will go away. Get medical help right away. Call your local emergency services (911 in the [place] Do not drive yourself to the hospital. Summary Iron deficiency anemia is when you have too little iron in your body. This condition is treated by finding out why you do not have enough iron in your body and then getting more iron. Take over-the-counter and prescription medicines only as told by your doctor. Eat fresh fruits and vegetables that are high in vitamin C. Get help right away if you cannot breathe well. This information is not intended to replace advice given to you by your health care provider. Make sure you discuss any questions you have with your health care provider. Document Revised: 09/22/2018 Document Reviewed: 09/22/2018 Elsevier Patient Education  2022 [place]. Iron-Rich Diet Iron is a mineral that helps your body produce hemoglobin. Hemoglobin is a protein in red blood cells that carries oxygen to your body's tissues. Eating too little iron may cause you to feel weak and tired, and it can increase your risk of infection. Iron is naturally found in many foods, and many foods have iron added to them (are iron-fortified). You may need to follow an iron-rich diet if you do not have enough iron in your body due to certain medical conditions. The amount of iron that you need each day depends on your age, your sex, and any medical conditions you have. Follow instructions from your health care provider or a dietitian about how much iron you should eat each day. What are tips for following this plan? Reading food labels Check food labels to see how many milligrams (mg) of iron are in each serving. Cooking Cook foods in pots and pans that are  made from iron. Take these steps to make it easier for your body to absorb iron from certain foods: Soak beans overnight before cooking. Soak whole grains overnight and drain them before using. Ferment flours before baking, such as by using yeast in bread dough. Meal planning When you eat foods that contain iron, you should eat them with foods that are high in vitamin C. These include oranges, peppers, tomatoes, potatoes, and mangoes. Vitamin C helps your body absorb iron. Certain foods and drinks prevent your body from absorbing iron properly. Avoid eating these foods in the same meal as iron-rich foods or with iron supplements. These foods include: Coffee, black tea, and red wine. Milk, dairy products, and foods that are high in calcium. Beans and soybeans. Whole grains. General information Take iron supplements only as told by your health care provider. An overdose of iron can be life-threatening. If you were prescribed iron supplements, take them with orange juice or a vitamin C supplement. When you eat iron-fortified foods or take an iron supplement, you should also eat foods that naturally contain iron, such as meat, poultry, and fish.  Eating naturally iron-rich foods helps your body absorb the iron that is added to other foods or contained in a supplement. Iron from animal sources is better absorbed than iron from plant sources. What foods should I eat? Fruits Prunes. Raisins. Eat fruits high in vitamin C, such as oranges, grapefruits, and strawberries, with iron-rich foods. Vegetables Spinach (cooked). Green peas. Broccoli. Fermented vegetables. Eat vegetables high in vitamin C, such as leafy greens, potatoes, bell peppers, and tomatoes, with iron-rich foods. Grains Iron-fortified breakfast cereal. Iron-fortified whole-wheat bread. Enriched rice. Sprouted grains. Meats and other proteins Beef liver. Beef. [place]. Chicken. Oysters. Shrimp. [place]. Sardines. Chickpeas. Nuts. Tofu.  Pumpkin seeds. Beverages Tomato juice. Fresh orange juice. Prune juice. Hibiscus tea. Iron-fortified instant breakfast shakes. Sweets and desserts Blackstrap molasses. Seasonings and condiments Tahini. Fermented soy sauce. Other foods Wheat germ. The items listed above may not be a complete list of recommended foods and beverages. Contact a dietitian for more information. What foods should I limit? These are foods that should be limited while eating iron-rich foods as they can reduce the absorption of iron in your body. Grains Whole grains. Bran cereal. Bran flour. Meats and other proteins Soybeans. Products made from soy protein. Black beans. Lentils. Mung beans. Split peas. Dairy Milk. Cream. Cheese. Yogurt. Cottage cheese. Beverages Coffee. Black tea. Red wine. Sweets and desserts Cocoa. Chocolate. Ice cream. Seasonings and condiments Basil. Oregano. Large amounts of parsley. The items listed above may not be a complete list of foods and beverages you should limit. Contact a dietitian for more information. Summary Iron is a mineral that helps your body produce hemoglobin. Hemoglobin is a protein in red blood cells that carries oxygen to your body's tissues. Iron is naturally found in many foods, and many foods have iron added to them (are iron-fortified). When you eat foods that contain iron, you should eat them with foods that are high in vitamin C. Vitamin C helps your body absorb iron. Certain foods and drinks prevent your body from absorbing iron properly, such as whole grains and dairy products. You should avoid eating these foods in the same meal as iron-rich foods or with iron supplements. This information is not intended to replace advice given to you by your health care provider. Make sure you discuss any questions you have with your health care provider. Document Revised: 12/27/2019 Document Reviewed: 12/27/2019 Elsevier Patient Education  2022 [employer].

## 2021-04-03 NOTE — Progress Notes (Signed)
Pt's bp 108/53 now after 617m ivb.  Pt no longer dizzy.  SUlice Dash rph notified-ok to transfuse iron now.   ?

## 2021-04-04 DIAGNOSIS — F411 Generalized anxiety disorder: Secondary | ICD-10-CM | POA: Diagnosis not present

## 2021-04-04 DIAGNOSIS — F331 Major depressive disorder, recurrent, moderate: Secondary | ICD-10-CM | POA: Diagnosis not present

## 2021-04-04 DIAGNOSIS — F1120 Opioid dependence, uncomplicated: Secondary | ICD-10-CM | POA: Diagnosis not present

## 2021-04-05 ENCOUNTER — Other Ambulatory Visit: Payer: Self-pay | Admitting: Cardiology

## 2021-04-05 DIAGNOSIS — N83201 Unspecified ovarian cyst, right side: Secondary | ICD-10-CM | POA: Diagnosis not present

## 2021-04-05 DIAGNOSIS — N83202 Unspecified ovarian cyst, left side: Secondary | ICD-10-CM | POA: Diagnosis not present

## 2021-04-10 ENCOUNTER — Other Ambulatory Visit: Payer: Self-pay

## 2021-04-10 ENCOUNTER — Inpatient Hospital Stay: Payer: Medicare HMO

## 2021-04-10 ENCOUNTER — Other Ambulatory Visit: Payer: Self-pay | Admitting: Pharmacist

## 2021-04-10 VITALS — BP 121/63 | HR 57 | Temp 98.4°F | Resp 18

## 2021-04-10 DIAGNOSIS — D509 Iron deficiency anemia, unspecified: Secondary | ICD-10-CM

## 2021-04-10 MED ORDER — SODIUM CHLORIDE 0.9 % IV SOLN
510.0000 mg | Freq: Once | INTRAVENOUS | Status: AC
  Administered 2021-04-10: 510 mg via INTRAVENOUS
  Filled 2021-04-10: qty 510

## 2021-04-10 MED ORDER — ACETAMINOPHEN 325 MG PO TABS
650.0000 mg | ORAL_TABLET | Freq: Once | ORAL | Status: AC
  Administered 2021-04-10: 650 mg via ORAL
  Filled 2021-04-10: qty 2

## 2021-04-10 MED ORDER — FAMOTIDINE IN NACL 20-0.9 MG/50ML-% IV SOLN
20.0000 mg | Freq: Once | INTRAVENOUS | Status: AC
  Administered 2021-04-10: 20 mg via INTRAVENOUS
  Filled 2021-04-10: qty 50

## 2021-04-10 MED ORDER — ONDANSETRON HCL 8 MG PO TABS
8.0000 mg | ORAL_TABLET | Freq: Once | ORAL | Status: AC
  Administered 2021-04-10: 8 mg via ORAL
  Filled 2021-04-10: qty 1

## 2021-04-10 NOTE — Patient Instructions (Signed)

## 2021-04-11 ENCOUNTER — Telehealth: Payer: Medicare HMO | Admitting: Hematology and Oncology

## 2021-04-25 DIAGNOSIS — F331 Major depressive disorder, recurrent, moderate: Secondary | ICD-10-CM | POA: Diagnosis not present

## 2021-04-25 DIAGNOSIS — Z6823 Body mass index (BMI) 23.0-23.9, adult: Secondary | ICD-10-CM | POA: Diagnosis not present

## 2021-04-25 DIAGNOSIS — F411 Generalized anxiety disorder: Secondary | ICD-10-CM | POA: Diagnosis not present

## 2021-04-25 DIAGNOSIS — F1120 Opioid dependence, uncomplicated: Secondary | ICD-10-CM | POA: Diagnosis not present

## 2021-04-25 DIAGNOSIS — Z79891 Long term (current) use of opiate analgesic: Secondary | ICD-10-CM | POA: Diagnosis not present

## 2021-05-01 DIAGNOSIS — R531 Weakness: Secondary | ICD-10-CM | POA: Diagnosis not present

## 2021-05-01 DIAGNOSIS — R42 Dizziness and giddiness: Secondary | ICD-10-CM | POA: Diagnosis not present

## 2021-05-01 DIAGNOSIS — M47816 Spondylosis without myelopathy or radiculopathy, lumbar region: Secondary | ICD-10-CM | POA: Diagnosis not present

## 2021-05-01 DIAGNOSIS — Z789 Other specified health status: Secondary | ICD-10-CM | POA: Diagnosis not present

## 2021-05-01 DIAGNOSIS — Z7409 Other reduced mobility: Secondary | ICD-10-CM | POA: Diagnosis not present

## 2021-05-01 DIAGNOSIS — M25552 Pain in left hip: Secondary | ICD-10-CM | POA: Diagnosis not present

## 2021-05-01 DIAGNOSIS — Z9181 History of falling: Secondary | ICD-10-CM | POA: Diagnosis not present

## 2021-05-01 DIAGNOSIS — G8929 Other chronic pain: Secondary | ICD-10-CM | POA: Diagnosis not present

## 2021-05-01 DIAGNOSIS — M5442 Lumbago with sciatica, left side: Secondary | ICD-10-CM | POA: Diagnosis not present

## 2021-05-02 DIAGNOSIS — E1142 Type 2 diabetes mellitus with diabetic polyneuropathy: Secondary | ICD-10-CM | POA: Insufficient documentation

## 2021-05-02 DIAGNOSIS — R1909 Other intra-abdominal and pelvic swelling, mass and lump: Secondary | ICD-10-CM | POA: Diagnosis not present

## 2021-05-02 DIAGNOSIS — N83299 Other ovarian cyst, unspecified side: Secondary | ICD-10-CM | POA: Diagnosis not present

## 2021-05-04 ENCOUNTER — Other Ambulatory Visit: Payer: Self-pay | Admitting: Hematology and Oncology

## 2021-05-04 ENCOUNTER — Encounter: Payer: Medicare HMO | Admitting: Genetic Counselor

## 2021-05-04 DIAGNOSIS — D509 Iron deficiency anemia, unspecified: Secondary | ICD-10-CM

## 2021-05-07 ENCOUNTER — Ambulatory Visit: Payer: Medicare HMO | Admitting: Hematology and Oncology

## 2021-05-07 ENCOUNTER — Inpatient Hospital Stay: Payer: Medicare HMO | Attending: Hematology and Oncology

## 2021-05-07 ENCOUNTER — Encounter: Payer: Self-pay | Admitting: Hematology and Oncology

## 2021-05-07 ENCOUNTER — Inpatient Hospital Stay: Payer: Medicare HMO | Admitting: Hematology and Oncology

## 2021-05-07 ENCOUNTER — Telehealth: Payer: Self-pay | Admitting: Hematology and Oncology

## 2021-05-07 DIAGNOSIS — D509 Iron deficiency anemia, unspecified: Secondary | ICD-10-CM | POA: Diagnosis not present

## 2021-05-07 DIAGNOSIS — D508 Other iron deficiency anemias: Secondary | ICD-10-CM | POA: Diagnosis not present

## 2021-05-07 LAB — CBC AND DIFFERENTIAL
HCT: 43 (ref 36–46)
Hemoglobin: 14.1 (ref 12.0–16.0)
Neutrophils Absolute: 2.75
Platelets: 177 10*3/uL (ref 150–400)
WBC: 5

## 2021-05-07 LAB — CBC: RBC: 5.3 — AB (ref 3.87–5.11)

## 2021-05-07 LAB — IRON AND TIBC
Iron: 102 ug/dL (ref 28–170)
Saturation Ratios: 28 % (ref 10.4–31.8)
TIBC: 364 ug/dL (ref 250–450)
UIBC: 262 ug/dL

## 2021-05-07 LAB — FERRITIN: Ferritin: 26 ng/mL (ref 11–307)

## 2021-05-07 NOTE — Assessment & Plan Note (Addendum)
61 year old female with history of iron deficiency anemia treated with feraheme. She tolerated these treatments well. CBC today reveals hemoglobin increased from 10.4 to 14.1. She has had a very good response to feraheme. She denies ongoing symptoms of SOB or fatigue. We will plan to see her back in 3 months for repeat evaluation.  ?

## 2021-05-07 NOTE — Telephone Encounter (Signed)
Per 05/07/21 los next appt scheduled and confirmed with patient ?

## 2021-05-07 NOTE — Progress Notes (Addendum)
?Patient Care Team: ?Makayla Hoots, NP as PCP - General (Adult Health Nurse Practitioner) ?Park Liter, MD as PCP - Cardiology (Cardiology) ? ?Clinic Day:  05/07/2021 ? ?Referring physician: Carvel Getting Key, * ? ?ASSESSMENT & PLAN:  ? ?Assessment & Plan: ?Iron deficiency anemia ?61 year old female with history of iron deficiency anemia treated with feraheme. She tolerated these treatments well. CBC today reveals hemoglobin increased from 10.4 to 14.1. She has had a very good response to feraheme. She denies ongoing symptoms of SOB or fatigue. We will plan to see her back in 3 months for repeat evaluation.   ? ?The patient understands the plans discussed today and is in agreement with them.  She knows to contact our office if she develops concerns prior to her next appointment. ? ? ? ? ?Makayla Ped, NP  ?[facility] [address] [address] ?Dept: [phone] ?Dept Fax: [phone]  ? ?No orders of the defined types were placed in this encounter. ?  ? ? ?CHIEF COMPLAINT:  ?CC: A 61 year old female with history of iron deficiency anemia here for 6 week evaluation.  ? ?Current Treatment:  Surveillance ? ?INTERVAL HISTORY:  ?Makayla Fischer is here today for repeat clinical assessment. She denies fevers or chills. She denies pain. Her appetite is good. Her weight has been stable. ? ?I have reviewed the past medical history, past surgical history, social history and family history with the patient and they are unchanged from previous note. ? ?ALLERGIES:  is allergic to septra [bactrim] and sulfamethoxazole-trimethoprim. ? ?MEDICATIONS:  ?Current Outpatient Medications  ?Medication Sig Dispense Refill  ? albuterol (VENTOLIN HFA) 108 (90 Base) MCG/ACT inhaler Inhale 2 puffs into the lungs every 6 (six) hours as needed for wheezing or shortness of breath.    ? alendronate (FOSAMAX) 35 MG tablet TAKE 1 TABLET POO EVERY WEEK     ? amLODipine (NORVASC) 2.5 MG tablet Take by mouth.    ? Ascorbic Acid (VITAMIN C) 500 MG CAPS Take 1 capsule by mouth daily.    ? aspirin 81 MG tablet Take 81 mg by mouth daily.    ? atorvastatin (LIPITOR) 80 MG tablet TAKE 1 TABLET(80 MG) BY MOUTH DAILY 90 tablet 3  ? Buprenorphine HCl-Naloxone HCl 8-2 MG FILM Place 1 Film under the tongue 2 (two) times daily.    ? busPIRone (BUSPAR) 15 MG tablet Take by mouth.    ? carvedilol (COREG) 6.25 MG tablet Take 6.25 mg by mouth 2 (two) times daily with a meal.    ? clonazePAM (KLONOPIN) 1 MG tablet Take 1 mg by mouth 2 (two) times daily.    ? clopidogrel (PLAVIX) 75 MG tablet TAKE 1 TABLET(75 MG) BY MOUTH DAILY 90 tablet 3  ? cyanocobalamin 1000 MCG tablet Take 4,000 mcg by mouth daily.    ? cyclobenzaprine (FLEXERIL) 10 MG tablet Take 10 mg by mouth 3 (three) times daily as needed for muscle spasms. For muscle pain    ? escitalopram (LEXAPRO) 10 MG tablet Take 10 mg by mouth daily.    ? ezetimibe (ZETIA) 10 MG tablet TAKE 1 TABLET(10 MG) BY MOUTH DAILY 90 tablet 2  ? folic acid (FOLVITE) 914 MCG tablet Take 1 tablet by mouth daily.    ? gabapentin (NEURONTIN) 600 MG tablet Take 600 mg by mouth 4 (four) times daily.    ? hydrochlorothiazide (HYDRODIURIL) 12.5 MG tablet Take 12.5 mg by mouth daily.    ?  isosorbide mononitrate (IMDUR) 60 MG 24 hr tablet Take 1 tablet (60 mg total) by mouth daily. 30 tablet 0  ? lisinopril (ZESTRIL) 20 MG tablet Take 20 mg by mouth 2 (two) times daily.    ? magnesium oxide (MAG-OX) 400 MG tablet Take 1 tablet by mouth 2 (two) times daily.    ? metFORMIN (GLUCOPHAGE) 500 MG tablet Take 500 mg by mouth 2 (two) times daily.    ? naloxone (NARCAN) nasal spray 4 mg/0.1 mL See admin instructions.    ? nicotine (NICODERM CQ - DOSED IN MG/24 HOURS) 14 mg/24hr patch Place 1 patch (14 mg total) onto the skin daily. (Patient not taking: Reported on 03/26/2021) 14 patch 0  ? nicotine (NICODERM CQ - DOSED IN MG/24 HOURS) 21 mg/24hr patch Place 1 patch  (21 mg total) onto the skin daily. (Patient not taking: Reported on 03/26/2021) 14 patch 0  ? nicotine (NICODERM CQ - DOSED IN MG/24 HR) 7 mg/24hr patch Place 1 patch (7 mg total) onto the skin daily. (Patient not taking: Reported on 03/26/2021) 14 patch 0  ? nitroGLYCERIN (NITROSTAT) 0.4 MG SL tablet Place 0.4 mg under the tongue every 5 (five) minutes as needed for chest pain.    ? ondansetron (ZOFRAN) 4 MG tablet Take 1 tablet (4 mg total) by mouth every 8 (eight) hours as needed for nausea or vomiting. 30 tablet 2  ? pantoprazole (PROTONIX) 40 MG tablet Take 40 mg by mouth daily.    ? potassium gluconate 595 (99 K) MG TABS tablet Take 595 mg by mouth 2 (two) times daily.    ? ranolazine (RANEXA) 500 MG 12 hr tablet Take 1 tablet (500 mg total) by mouth 2 (two) times daily. 180 tablet 3  ? SLOW FE 142 (45 Fe) MG TBCR Take 1 tablet by mouth 2 (two) times daily.    ? vitamin B-6 (PYRIDOXINE) 25 MG tablet Take 25 mg by mouth daily.    ? ?No current facility-administered medications for this visit.  ? ? ?HISTORY OF PRESENT ILLNESS:  ? ?Oncology History  ? No history exists.  ?  ? ? ?REVIEW OF SYSTEMS:  ? ?Constitutional: Denies fevers, chills or abnormal weight loss ?Eyes: Denies blurriness of vision ?Ears, nose, mouth, throat, and face: Denies mucositis or sore throat ?Respiratory: Denies cough, dyspnea or wheezes ?Cardiovascular: Denies palpitation, chest discomfort or lower extremity swelling ?Gastrointestinal:  Denies nausea, heartburn or change in bowel habits ?Skin: Denies abnormal skin rashes ?Lymphatics: Denies new lymphadenopathy or easy bruising ?Neurological:Denies numbness, tingling or new weaknesses ?Behavioral/Psych: Mood is stable, no new changes  ?All other systems were reviewed with the patient and are negative. ? ? ?VITALS:  ?Blood pressure 134/69, pulse 60, temperature 98.4 ?F (36.9 ?C), temperature source Oral, resp. rate 20, SpO2 96 %.  ?Wt Readings from Last 3 Encounters:  ?04/03/21 146 lb (66.2  kg)  ?03/26/21 151 lb (68.5 kg)  ?03/19/21 150 lb (68 kg)  ?  ?There is no height or weight on file to calculate BMI. ? ?Performance status (ECOG): 1 - Symptomatic but completely ambulatory ? ?PHYSICAL EXAM:  ? ?GENERAL:alert, no distress and comfortable ?SKIN: skin color, texture, turgor are normal, no rashes or significant lesions ?EYES: normal, Conjunctiva are pink and non-injected, sclera clear ?OROPHARYNX:no exudate, no erythema and lips, buccal mucosa, and tongue normal  ?NECK: supple, thyroid normal size, non-tender, without nodularity ?LYMPH:  no palpable lymphadenopathy in the cervical, axillary or inguinal ?LUNGS: clear to auscultation and percussion with normal breathing  effort ?HEART: regular rate & rhythm and no murmurs and no lower extremity edema ?ABDOMEN:abdomen soft, non-tender and normal bowel sounds ?Musculoskeletal:no cyanosis of digits and no clubbing  ?NEURO: alert & oriented x 3 with fluent speech, no focal motor/sensory deficits ? ?LABORATORY DATA:  ?I have reviewed the data as listed ?   ?Component Value Date/Time  ? NA 135 (A) 03/26/2021 0000  ? K 3.9 03/26/2021 0000  ? CL 98 (A) 03/26/2021 0000  ? CO2 32 (A) 03/26/2021 0000  ? GLUCOSE 111 (H) 09/07/2020 1856  ? BUN 12 03/26/2021 0000  ? CREATININE 0.8 03/26/2021 0000  ? CREATININE 0.69 09/07/2020 1856  ? CALCIUM 8.8 03/26/2021 0000  ? PROT 7.4 07/03/2011 1234  ? ALBUMIN 4.4 03/26/2021 0000  ? AST 38 (A) 03/26/2021 0000  ? ALT 17 03/26/2021 0000  ? ALKPHOS 77 03/26/2021 0000  ? BILITOT 0.8 07/03/2011 1234  ? GFRNONAA >60 09/07/2020 1856  ? GFRAA >90 07/12/2011 1251  ? ? ?No results found for: SPEP, UPEP ? ?Lab Results  ?Component Value Date  ? WBC 5.0 05/07/2021  ? NEUTROABS 2.75 05/07/2021  ? HGB 14.1 05/07/2021  ? HCT 43 05/07/2021  ? MCV 70 (A) 03/26/2021  ? PLT 177 05/07/2021  ? ? ?  Chemistry   ?   ?Component Value Date/Time  ? NA 135 (A) 03/26/2021 0000  ? K 3.9 03/26/2021 0000  ? CL 98 (A) 03/26/2021 0000  ? CO2 32 (A) 03/26/2021  0000  ? BUN 12 03/26/2021 0000  ? CREATININE 0.8 03/26/2021 0000  ? CREATININE 0.69 09/07/2020 1856  ? GLU 135 03/26/2021 0000  ?    ?Component Value Date/Time  ? CALCIUM 8.8 03/26/2021 0000  ? ALKPHOS 77

## 2021-05-07 NOTE — Progress Notes (Signed)
t

## 2021-05-08 DIAGNOSIS — R531 Weakness: Secondary | ICD-10-CM | POA: Diagnosis not present

## 2021-05-08 DIAGNOSIS — M25552 Pain in left hip: Secondary | ICD-10-CM | POA: Diagnosis not present

## 2021-05-08 DIAGNOSIS — R42 Dizziness and giddiness: Secondary | ICD-10-CM | POA: Diagnosis not present

## 2021-05-08 DIAGNOSIS — M5442 Lumbago with sciatica, left side: Secondary | ICD-10-CM | POA: Diagnosis not present

## 2021-05-08 DIAGNOSIS — M47816 Spondylosis without myelopathy or radiculopathy, lumbar region: Secondary | ICD-10-CM | POA: Diagnosis not present

## 2021-05-08 DIAGNOSIS — G8929 Other chronic pain: Secondary | ICD-10-CM | POA: Diagnosis not present

## 2021-05-08 DIAGNOSIS — Z7409 Other reduced mobility: Secondary | ICD-10-CM | POA: Diagnosis not present

## 2021-05-08 DIAGNOSIS — Z789 Other specified health status: Secondary | ICD-10-CM | POA: Diagnosis not present

## 2021-05-08 DIAGNOSIS — Z9181 History of falling: Secondary | ICD-10-CM | POA: Diagnosis not present

## 2021-05-09 DIAGNOSIS — F411 Generalized anxiety disorder: Secondary | ICD-10-CM | POA: Diagnosis not present

## 2021-05-09 DIAGNOSIS — F1120 Opioid dependence, uncomplicated: Secondary | ICD-10-CM | POA: Diagnosis not present

## 2021-05-09 DIAGNOSIS — F331 Major depressive disorder, recurrent, moderate: Secondary | ICD-10-CM | POA: Diagnosis not present

## 2021-05-15 DIAGNOSIS — R42 Dizziness and giddiness: Secondary | ICD-10-CM | POA: Diagnosis not present

## 2021-05-15 DIAGNOSIS — M5442 Lumbago with sciatica, left side: Secondary | ICD-10-CM | POA: Diagnosis not present

## 2021-05-15 DIAGNOSIS — Z7409 Other reduced mobility: Secondary | ICD-10-CM | POA: Diagnosis not present

## 2021-05-15 DIAGNOSIS — M25552 Pain in left hip: Secondary | ICD-10-CM | POA: Diagnosis not present

## 2021-05-15 DIAGNOSIS — M47816 Spondylosis without myelopathy or radiculopathy, lumbar region: Secondary | ICD-10-CM | POA: Diagnosis not present

## 2021-05-15 DIAGNOSIS — Z9181 History of falling: Secondary | ICD-10-CM | POA: Diagnosis not present

## 2021-05-15 DIAGNOSIS — R531 Weakness: Secondary | ICD-10-CM | POA: Diagnosis not present

## 2021-05-15 DIAGNOSIS — G8929 Other chronic pain: Secondary | ICD-10-CM | POA: Diagnosis not present

## 2021-05-15 DIAGNOSIS — Z789 Other specified health status: Secondary | ICD-10-CM | POA: Diagnosis not present

## 2021-05-18 ENCOUNTER — Telehealth: Payer: Self-pay | Admitting: Cardiology

## 2021-05-18 DIAGNOSIS — E119 Type 2 diabetes mellitus without complications: Secondary | ICD-10-CM

## 2021-05-18 DIAGNOSIS — I2510 Atherosclerotic heart disease of native coronary artery without angina pectoris: Secondary | ICD-10-CM

## 2021-05-18 DIAGNOSIS — E782 Mixed hyperlipidemia: Secondary | ICD-10-CM

## 2021-05-18 NOTE — Telephone Encounter (Signed)
Pt c/o medication issue: ? ?1. Name of Medication: atorvastatin (LIPITOR) 80 MG tablet [[id number]] ? ?2. How are you currently taking this medication (dosage and times per day)? 8 MG ? ?3. Are you having a reaction (difficulty breathing--STAT)? No  ? ?4. What is your medication issue? Pt stated she has taken 12 pills in the last 3 days thinking it was her gabapentin.  She called postioin control already but they told her to call Cardiology to see when she should take this med again ?   ? ?Best number [phone]  ?

## 2021-05-22 NOTE — Telephone Encounter (Signed)
Spoke with pt. She has not tsken Lipitor since last Thursday. Advised to come in Thursday for blood work as per dr. Wendy Poet note. Pt agreed. Lab req sent to lab. ?

## 2021-05-24 DIAGNOSIS — I2510 Atherosclerotic heart disease of native coronary artery without angina pectoris: Secondary | ICD-10-CM | POA: Diagnosis not present

## 2021-05-24 DIAGNOSIS — E782 Mixed hyperlipidemia: Secondary | ICD-10-CM | POA: Diagnosis not present

## 2021-05-24 DIAGNOSIS — E119 Type 2 diabetes mellitus without complications: Secondary | ICD-10-CM | POA: Diagnosis not present

## 2021-05-25 LAB — ALT: ALT: 37 IU/L — ABNORMAL HIGH (ref 0–32)

## 2021-05-25 LAB — AST: AST: 42 IU/L — ABNORMAL HIGH (ref 0–40)

## 2021-05-29 DIAGNOSIS — G8929 Other chronic pain: Secondary | ICD-10-CM | POA: Diagnosis not present

## 2021-05-29 DIAGNOSIS — M47816 Spondylosis without myelopathy or radiculopathy, lumbar region: Secondary | ICD-10-CM | POA: Diagnosis not present

## 2021-05-29 DIAGNOSIS — M25552 Pain in left hip: Secondary | ICD-10-CM | POA: Diagnosis not present

## 2021-05-29 DIAGNOSIS — Z7409 Other reduced mobility: Secondary | ICD-10-CM | POA: Diagnosis not present

## 2021-05-29 DIAGNOSIS — R42 Dizziness and giddiness: Secondary | ICD-10-CM | POA: Diagnosis not present

## 2021-05-29 DIAGNOSIS — Z9181 History of falling: Secondary | ICD-10-CM | POA: Diagnosis not present

## 2021-05-29 DIAGNOSIS — Z789 Other specified health status: Secondary | ICD-10-CM | POA: Diagnosis not present

## 2021-05-29 DIAGNOSIS — M5442 Lumbago with sciatica, left side: Secondary | ICD-10-CM | POA: Diagnosis not present

## 2021-05-29 DIAGNOSIS — R531 Weakness: Secondary | ICD-10-CM | POA: Diagnosis not present

## 2021-05-31 DIAGNOSIS — R42 Dizziness and giddiness: Secondary | ICD-10-CM | POA: Diagnosis not present

## 2021-05-31 DIAGNOSIS — R531 Weakness: Secondary | ICD-10-CM | POA: Diagnosis not present

## 2021-05-31 DIAGNOSIS — M25552 Pain in left hip: Secondary | ICD-10-CM | POA: Diagnosis not present

## 2021-05-31 DIAGNOSIS — M47816 Spondylosis without myelopathy or radiculopathy, lumbar region: Secondary | ICD-10-CM | POA: Diagnosis not present

## 2021-05-31 DIAGNOSIS — Z9181 History of falling: Secondary | ICD-10-CM | POA: Diagnosis not present

## 2021-05-31 DIAGNOSIS — M5442 Lumbago with sciatica, left side: Secondary | ICD-10-CM | POA: Diagnosis not present

## 2021-05-31 DIAGNOSIS — Z789 Other specified health status: Secondary | ICD-10-CM | POA: Diagnosis not present

## 2021-05-31 DIAGNOSIS — Z7409 Other reduced mobility: Secondary | ICD-10-CM | POA: Diagnosis not present

## 2021-05-31 DIAGNOSIS — G8929 Other chronic pain: Secondary | ICD-10-CM | POA: Diagnosis not present

## 2021-06-06 DIAGNOSIS — F411 Generalized anxiety disorder: Secondary | ICD-10-CM | POA: Diagnosis not present

## 2021-06-06 DIAGNOSIS — Z6823 Body mass index (BMI) 23.0-23.9, adult: Secondary | ICD-10-CM | POA: Diagnosis not present

## 2021-06-06 DIAGNOSIS — F1120 Opioid dependence, uncomplicated: Secondary | ICD-10-CM | POA: Diagnosis not present

## 2021-06-06 DIAGNOSIS — F331 Major depressive disorder, recurrent, moderate: Secondary | ICD-10-CM | POA: Diagnosis not present

## 2021-06-06 DIAGNOSIS — R319 Hematuria, unspecified: Secondary | ICD-10-CM | POA: Diagnosis not present

## 2021-06-19 DIAGNOSIS — M5459 Other low back pain: Secondary | ICD-10-CM | POA: Diagnosis not present

## 2021-06-19 DIAGNOSIS — M5442 Lumbago with sciatica, left side: Secondary | ICD-10-CM | POA: Diagnosis not present

## 2021-06-19 DIAGNOSIS — G8929 Other chronic pain: Secondary | ICD-10-CM | POA: Diagnosis not present

## 2021-06-19 DIAGNOSIS — M47816 Spondylosis without myelopathy or radiculopathy, lumbar region: Secondary | ICD-10-CM | POA: Diagnosis not present

## 2021-06-20 DIAGNOSIS — E11610 Type 2 diabetes mellitus with diabetic neuropathic arthropathy: Secondary | ICD-10-CM | POA: Diagnosis not present

## 2021-06-20 DIAGNOSIS — E782 Mixed hyperlipidemia: Secondary | ICD-10-CM | POA: Diagnosis not present

## 2021-06-20 DIAGNOSIS — D509 Iron deficiency anemia, unspecified: Secondary | ICD-10-CM | POA: Diagnosis not present

## 2021-06-20 DIAGNOSIS — Z6823 Body mass index (BMI) 23.0-23.9, adult: Secondary | ICD-10-CM | POA: Diagnosis not present

## 2021-06-20 DIAGNOSIS — I2510 Atherosclerotic heart disease of native coronary artery without angina pectoris: Secondary | ICD-10-CM | POA: Diagnosis not present

## 2021-06-27 DIAGNOSIS — F411 Generalized anxiety disorder: Secondary | ICD-10-CM | POA: Diagnosis not present

## 2021-06-27 DIAGNOSIS — F331 Major depressive disorder, recurrent, moderate: Secondary | ICD-10-CM | POA: Diagnosis not present

## 2021-06-27 DIAGNOSIS — F1120 Opioid dependence, uncomplicated: Secondary | ICD-10-CM | POA: Diagnosis not present

## 2021-06-29 ENCOUNTER — Encounter: Payer: Medicare HMO | Admitting: Genetic Counselor

## 2021-06-29 ENCOUNTER — Other Ambulatory Visit: Payer: Medicare HMO

## 2021-07-18 DIAGNOSIS — F331 Major depressive disorder, recurrent, moderate: Secondary | ICD-10-CM | POA: Diagnosis not present

## 2021-07-18 DIAGNOSIS — Z79891 Long term (current) use of opiate analgesic: Secondary | ICD-10-CM | POA: Diagnosis not present

## 2021-07-18 DIAGNOSIS — Z6823 Body mass index (BMI) 23.0-23.9, adult: Secondary | ICD-10-CM | POA: Diagnosis not present

## 2021-07-18 DIAGNOSIS — F411 Generalized anxiety disorder: Secondary | ICD-10-CM | POA: Diagnosis not present

## 2021-07-18 DIAGNOSIS — F1120 Opioid dependence, uncomplicated: Secondary | ICD-10-CM | POA: Diagnosis not present

## 2021-08-02 ENCOUNTER — Other Ambulatory Visit: Payer: Self-pay

## 2021-08-06 ENCOUNTER — Encounter: Payer: Self-pay | Admitting: Hematology and Oncology

## 2021-08-06 ENCOUNTER — Other Ambulatory Visit: Payer: Self-pay | Admitting: Hematology and Oncology

## 2021-08-06 ENCOUNTER — Inpatient Hospital Stay: Payer: Medicare HMO

## 2021-08-06 ENCOUNTER — Telehealth: Payer: Self-pay | Admitting: Hematology and Oncology

## 2021-08-06 ENCOUNTER — Inpatient Hospital Stay: Payer: Medicare HMO | Attending: Hematology and Oncology | Admitting: Hematology and Oncology

## 2021-08-06 DIAGNOSIS — Z9189 Other specified personal risk factors, not elsewhere classified: Secondary | ICD-10-CM | POA: Diagnosis not present

## 2021-08-06 DIAGNOSIS — R911 Solitary pulmonary nodule: Secondary | ICD-10-CM | POA: Diagnosis not present

## 2021-08-06 DIAGNOSIS — D509 Iron deficiency anemia, unspecified: Secondary | ICD-10-CM

## 2021-08-06 LAB — FERRITIN: Ferritin: 20 ng/mL (ref 11–307)

## 2021-08-06 LAB — CBC AND DIFFERENTIAL
HCT: 41 (ref 36–46)
Hemoglobin: 14.1 (ref 12.0–16.0)
Neutrophils Absolute: 2.75
Platelets: 161 10*3/uL (ref 150–400)
WBC: 5

## 2021-08-06 LAB — IRON AND TIBC
Iron: 143 ug/dL (ref 28–170)
Saturation Ratios: 42 % — ABNORMAL HIGH (ref 10.4–31.8)
TIBC: 337 ug/dL (ref 250–450)
UIBC: 194 ug/dL

## 2021-08-06 LAB — CBC: RBC: 4.44 (ref 3.87–5.11)

## 2021-08-06 NOTE — Telephone Encounter (Signed)
Per 08/06/21 los next appt scheduled and confirmed with patient 

## 2021-08-06 NOTE — Progress Notes (Signed)
Patient Care Team: Marga Hoots, NP as PCP - General (Adult Health Nurse Practitioner) Park Liter, MD as PCP - Cardiology (Cardiology)  Clinic Day:  08/06/2021  Referring physician: Carvel Getting Key, *  ASSESSMENT & PLAN:   Assessment & Plan: Iron deficiency anemia 61 year old female with history of iron deficiency anemia treated with feraheme. She tolerated these treatments well. CBC today reveals hemoglobin stable at  14.1. She has had a very good response to feraheme. She denies ongoing symptoms of SOB or fatigue. She will follow up with GI. We will plan to see her back in 3 months for repeat evaluation.     The patient understands the plans discussed today and is in agreement with them.  She knows to contact our office if she develops concerns prior to her next appointment.    Melodye Ped, NP  [facility] [address] [place] [address] Dept: [phone] Dept Fax: [phone]   Orders Placed This Encounter  Procedures   CBC and differential    This external order was created through the Results Console.   CBC    This external order was created through the Results Console.      CHIEF COMPLAINT:  CC: A 61 year old female with history of iron deficiency anemia here for 3 month evaluation.   Current Treatment:  Surveillance  INTERVAL HISTORY:  Makayla Fischer is here today for repeat clinical assessment. She denies fevers or chills. She denies pain. Her appetite is good. Her weight has been stable.  I have reviewed the past medical history, past surgical history, social history and family history with the patient and they are unchanged from previous note.  ALLERGIES:  is allergic to septra [bactrim] and sulfamethoxazole-trimethoprim.  MEDICATIONS:  Current Outpatient Medications  Medication Sig Dispense Refill   pantoprazole (PROTONIX) 40 MG tablet Take 40 mg by mouth daily.      albuterol (VENTOLIN HFA) 108 (90 Base) MCG/ACT inhaler Inhale 2 puffs into the lungs every 6 (six) hours as needed for wheezing or shortness of breath.     alendronate (FOSAMAX) 35 MG tablet TAKE 1 TABLET POO EVERY WEEK     amLODipine (NORVASC) 2.5 MG tablet Take by mouth.     Ascorbic Acid (VITAMIN C) 500 MG CAPS Take 1 capsule by mouth daily.     aspirin 81 MG tablet Take 81 mg by mouth daily.     atorvastatin (LIPITOR) 80 MG tablet TAKE 1 TABLET(80 MG) BY MOUTH DAILY 90 tablet 3   Buprenorphine HCl-Naloxone HCl 8-2 MG FILM Place under the tongue.     carvedilol (COREG) 6.25 MG tablet Take by mouth.     clonazePAM (KLONOPIN) 0.5 MG tablet Take by mouth.     clopidogrel (PLAVIX) 75 MG tablet TAKE 1 TABLET(75 MG) BY MOUTH DAILY 90 tablet 3   cyanocobalamin 1000 MCG tablet Take 4,000 mcg by mouth daily.     cyclobenzaprine (FLEXERIL) 10 MG tablet Take 10 mg by mouth 3 (three) times daily as needed for muscle spasms. For muscle pain     ezetimibe (ZETIA) 10 MG tablet TAKE 1 TABLET(10 MG) BY MOUTH DAILY 90 tablet 2   folic acid (FOLVITE) 914 MCG tablet Take by mouth.     gabapentin (NEURONTIN) 600 MG tablet Take 600 mg by mouth 4 (four) times daily.     hydrochlorothiazide (HYDRODIURIL) 12.5 MG tablet Take 12.5 mg by mouth daily.     isosorbide  mononitrate (IMDUR) 30 MG 24 hr tablet Take 1 tablet by mouth daily.     lisinopril (ZESTRIL) 20 MG tablet Take 20 mg by mouth 2 (two) times daily.     magnesium oxide (MAG-OX) 400 MG tablet Take 1 tablet by mouth 2 (two) times daily.     metFORMIN (GLUCOPHAGE) 500 MG tablet Take 500 mg by mouth 2 (two) times daily.     naloxone (NARCAN) nasal spray 4 mg/0.1 mL See admin instructions.     nicotine (NICODERM CQ - DOSED IN MG/24 HOURS) 14 mg/24hr patch Place 1 patch (14 mg total) onto the skin daily. (Patient not taking: Reported on 03/26/2021) 14 patch 0   nicotine (NICODERM CQ - DOSED IN MG/24 HOURS) 21 mg/24hr patch Place 1 patch (21 mg total) onto the  skin daily. (Patient not taking: Reported on 03/26/2021) 14 patch 0   nicotine (NICODERM CQ - DOSED IN MG/24 HR) 7 mg/24hr patch Place 1 patch (7 mg total) onto the skin daily. (Patient not taking: Reported on 03/26/2021) 14 patch 0   nitroGLYCERIN (NITROSTAT) 0.4 MG SL tablet Place 0.4 mg under the tongue every 5 (five) minutes as needed for chest pain.     ondansetron (ZOFRAN) 4 MG tablet Take 1 tablet (4 mg total) by mouth every 8 (eight) hours as needed for nausea or vomiting. 30 tablet 2   potassium gluconate 595 (99 K) MG TABS tablet Take 595 mg by mouth 2 (two) times daily.     ranolazine (RANEXA) 500 MG 12 hr tablet Take 1 tablet (500 mg total) by mouth 2 (two) times daily. 180 tablet 3   vitamin B-6 (PYRIDOXINE) 25 MG tablet Take 25 mg by mouth daily.     No current facility-administered medications for this visit.    HISTORY OF PRESENT ILLNESS:   Oncology History   No history exists.      REVIEW OF SYSTEMS:   Constitutional: Denies fevers, chills or abnormal weight loss Eyes: Denies blurriness of vision Ears, nose, mouth, throat, and face: Denies mucositis or sore throat Respiratory: Denies cough, dyspnea or wheezes Cardiovascular: Denies palpitation, chest discomfort or lower extremity swelling Gastrointestinal:  Denies nausea, heartburn or change in bowel habits Skin: Denies abnormal skin rashes Lymphatics: Denies new lymphadenopathy or easy bruising Neurological:Denies numbness, tingling or new weaknesses Behavioral/Psych: Mood is stable, no new changes  All other systems were reviewed with the patient and are negative.   VITALS:  Blood pressure 127/67, pulse (!) 56, temperature 98.7 F (37.1 C), temperature source Oral, resp. rate 20, height '5\' 4"'$  (1.626 m), weight 147 lb 1.6 oz (66.7 kg), SpO2 95 %.  Wt Readings from Last 3 Encounters:  08/06/21 147 lb 1.6 oz (66.7 kg)  04/03/21 146 lb (66.2 kg)  03/26/21 151 lb (68.5 kg)    Body mass index is 25.25  kg/m.  Performance status (ECOG): 1 - Symptomatic but completely ambulatory  PHYSICAL EXAM:   GENERAL:alert, no distress and comfortable SKIN: skin color, texture, turgor are normal, no rashes or significant lesions EYES: normal, Conjunctiva are pink and non-injected, sclera clear OROPHARYNX:no exudate, no erythema and lips, buccal mucosa, and tongue normal  NECK: supple, thyroid normal size, non-tender, without nodularity LYMPH:  no palpable lymphadenopathy in the cervical, axillary or inguinal LUNGS: clear to auscultation and percussion with normal breathing effort HEART: regular rate & rhythm and no murmurs and no lower extremity edema ABDOMEN:abdomen soft, non-tender and normal bowel sounds Musculoskeletal:no cyanosis of digits and no clubbing  NEURO:  alert & oriented x 3 with fluent speech, no focal motor/sensory deficits  LABORATORY DATA:  I have reviewed the data as listed    Component Value Date/Time   NA 135 (A) 03/26/2021 0000   K 3.9 03/26/2021 0000   CL 98 (A) 03/26/2021 0000   CO2 32 (A) 03/26/2021 0000   GLUCOSE 111 (H) 09/07/2020 1856   BUN 12 03/26/2021 0000   CREATININE 0.8 03/26/2021 0000   CREATININE 0.69 09/07/2020 1856   CALCIUM 8.8 03/26/2021 0000   PROT 7.4 07/03/2011 1234   ALBUMIN 4.4 03/26/2021 0000   AST 42 (H) 05/24/2021 1551   ALT 37 (H) 05/24/2021 1551   ALKPHOS 77 03/26/2021 0000   BILITOT 0.8 07/03/2011 1234   GFRNONAA >60 09/07/2020 1856   GFRAA >90 07/12/2011 1251    No results found for: "SPEP", "UPEP"  Lab Results  Component Value Date   WBC 5.0 08/06/2021   NEUTROABS 2.75 08/06/2021   HGB 14.1 08/06/2021   HCT 41 08/06/2021   MCV 70 (A) 03/26/2021   PLT 161 08/06/2021      Chemistry      Component Value Date/Time   NA 135 (A) 03/26/2021 0000   K 3.9 03/26/2021 0000   CL 98 (A) 03/26/2021 0000   CO2 32 (A) 03/26/2021 0000   BUN 12 03/26/2021 0000   CREATININE 0.8 03/26/2021 0000   CREATININE 0.69 09/07/2020 1856   GLU  135 03/26/2021 0000      Component Value Date/Time   CALCIUM 8.8 03/26/2021 0000   ALKPHOS 77 03/26/2021 0000   AST 42 (H) 05/24/2021 1551   ALT 37 (H) 05/24/2021 1551   BILITOT 0.8 07/03/2011 1234       RADIOGRAPHIC STUDIES: I have personally reviewed the radiological images as listed and agreed with the findings in the report. No results found.

## 2021-08-06 NOTE — Assessment & Plan Note (Signed)
61 year old female with history of iron deficiency anemia treated with feraheme. She tolerated these treatments well. CBC today reveals hemoglobin stable at  14.1. She has had a very good response to feraheme. She denies ongoing symptoms of SOB or fatigue. She will follow up with GI. We will plan to see her back in 3 months for repeat evaluation.

## 2021-08-08 DIAGNOSIS — F1120 Opioid dependence, uncomplicated: Secondary | ICD-10-CM | POA: Diagnosis not present

## 2021-08-08 DIAGNOSIS — F411 Generalized anxiety disorder: Secondary | ICD-10-CM | POA: Diagnosis not present

## 2021-08-08 DIAGNOSIS — F331 Major depressive disorder, recurrent, moderate: Secondary | ICD-10-CM | POA: Diagnosis not present

## 2021-08-29 ENCOUNTER — Other Ambulatory Visit: Payer: Self-pay | Admitting: Cardiology

## 2021-08-29 DIAGNOSIS — F331 Major depressive disorder, recurrent, moderate: Secondary | ICD-10-CM | POA: Diagnosis not present

## 2021-08-29 DIAGNOSIS — Z6823 Body mass index (BMI) 23.0-23.9, adult: Secondary | ICD-10-CM | POA: Diagnosis not present

## 2021-08-29 DIAGNOSIS — Z79891 Long term (current) use of opiate analgesic: Secondary | ICD-10-CM | POA: Diagnosis not present

## 2021-08-29 DIAGNOSIS — F411 Generalized anxiety disorder: Secondary | ICD-10-CM | POA: Diagnosis not present

## 2021-08-29 DIAGNOSIS — F1120 Opioid dependence, uncomplicated: Secondary | ICD-10-CM | POA: Diagnosis not present

## 2021-09-17 ENCOUNTER — Other Ambulatory Visit: Payer: Self-pay

## 2021-09-17 ENCOUNTER — Ambulatory Visit: Payer: Medicare HMO | Admitting: Cardiology

## 2021-09-17 ENCOUNTER — Other Ambulatory Visit (HOSPITAL_COMMUNITY): Payer: Self-pay

## 2021-09-17 ENCOUNTER — Encounter: Payer: Self-pay | Admitting: Cardiology

## 2021-09-17 VITALS — BP 90/58 | HR 61 | Ht 64.0 in | Wt 143.6 lb

## 2021-09-17 DIAGNOSIS — I10 Essential (primary) hypertension: Secondary | ICD-10-CM | POA: Diagnosis not present

## 2021-09-17 DIAGNOSIS — I2510 Atherosclerotic heart disease of native coronary artery without angina pectoris: Secondary | ICD-10-CM | POA: Diagnosis not present

## 2021-09-17 DIAGNOSIS — E1142 Type 2 diabetes mellitus with diabetic polyneuropathy: Secondary | ICD-10-CM

## 2021-09-17 DIAGNOSIS — R079 Chest pain, unspecified: Secondary | ICD-10-CM

## 2021-09-17 DIAGNOSIS — E785 Hyperlipidemia, unspecified: Secondary | ICD-10-CM | POA: Diagnosis not present

## 2021-09-17 DIAGNOSIS — F17200 Nicotine dependence, unspecified, uncomplicated: Secondary | ICD-10-CM

## 2021-09-17 LAB — TROPONIN T: Troponin T (Highly Sensitive): 8 ng/L (ref 0–14)

## 2021-09-17 NOTE — Progress Notes (Unsigned)
Cardiology Office Note:    Date:  09/17/2021   ID:  Makayla Fischer, DOB 03/27/1960, MRN [id number]  PCP:  Marga Hoots, NP  Cardiologist:  Jenne Campus, MD    Referring MD: Carvel Getting Key, *   Chief Complaint  Patient presents with   Chest Injury    L arm pain Ongoing for 2 weeks     History of Present Illness:    Makayla Fischer is a 61 y.o. female    with past medical history significant for coronary artery disease status post PTCA and stenting with drug-eluting stent to circumflex artery in August 2020, prior she did have PTCA and stenting of LAD.  In March of this year she did have a stress test done around the hospital which was negative, past medical history is also significant for essential hypertension, dyslipidemia, chronic smoking In August 16, 2020 she did have another cardiac catheterization done and she required stenting to the left anterior descending artery. He is coming today to my office complaining of having chest pain.  She said the pain happen always after she eats when she eats she lay down and she will have sensation in the middle of the chest.  I asked if she takes any medication for stomach she mention years and she also said that she had probably stomach for long time and she is way overdue to see her gastroenterologist.  She does not have any exertional chest pain.  She said she can walk with her dog in the park with no difficulties.  She will get some shortness of breath as usual if she compare herself with herself year ago there is no decrease in ability to exercise.  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 Posey Rea, stress test in 01/2011   Hypomagnesemia 08/10/2017   Hyponatremia 08/10/2017   Incisional hernia    with obstruction   Myocardial infarction ([place]) 02/2008   Neuromuscular disorder ([place])    neuropathy   Osteoporosis    Pulmonary nodule less than 6 mm in diameter with high risk for malignant neoplasm 11/23/2019   Shortness of breath    Status post insertion of drug eluting coronary artery stent 09/15/2018   Substance abuse ([place])    drugs and alcohol   Tobacco abuse 08/10/2017   Type II diabetes mellitus, well controlled ([place]) 85/46/2703   Uncomplicated opioid dependence ([place]) 08/10/2017   Unstable angina ([place]) 09/15/2018   Varicose veins of leg with complications 50/09/3816   Varicose veins of lower extremities with other complications 29/93/7169   Varicose veins of lower extremity 10/12/2013    Past Surgical History:  Procedure Laterality Date   CARDIAC CATHETERIZATION  2/2010Gaspar [facility], x2 stents    CARDIAC SURGERY     CHOLECYSTECTOMY     CORONARY STENT INTERVENTION N/A 08/16/2020   Procedure: CORONARY STENT INTERVENTION;  Surgeon: Wellington Hampshire, MD;  Location: [facility];  Service: Cardiovascular;  Laterality: N/A;   INTRAVASCULAR IMAGING/OCT N/A 08/16/2020   Procedure: INTRAVASCULAR IMAGING/OCT;  Surgeon: Wellington Hampshire, MD;  Location: [facility];  Service: Cardiovascular;  Laterality: N/A;   INTRAVASCULAR PRESSURE WIRE/FFR STUDY N/A 08/16/2020   Procedure: INTRAVASCULAR PRESSURE WIRE/FFR STUDY;  Surgeon: Wellington Hampshire, MD;  Location: [facility];  Service:  Cardiovascular;  Laterality: N/A;   LEFT HEART CATH AND CORONARY ANGIOGRAPHY N/A 08/16/2020   Procedure: LEFT HEART CATH AND CORONARY ANGIOGRAPHY;  Surgeon: Wellington Hampshire, MD;  Location: [facility];  Service: Cardiovascular;  Laterality: N/A;   MANDIBLE FRACTURE SURGERY     1980   TONSILLECTOMY AND ADENOIDECTOMY     TUBAL LIGATION     VENTRAL HERNIA REPAIR  07/12/2011   Procedure: LAPAROSCOPIC VENTRAL HERNIA;  Surgeon: Adin Hector, MD;  Location: [facility];  Service: General;  Laterality: N/A;  laparoscopic repair incisional hernia with mesh    Current Medications: Current Meds  Medication Sig   albuterol (VENTOLIN HFA) 108 (90 Base) MCG/ACT inhaler Inhale 2 puffs into the lungs every 6 (six) hours as needed for wheezing or shortness of breath.   amLODipine (NORVASC) 2.5 MG tablet Take 2.5 mg by mouth daily.   Ascorbic Acid (VITAMIN C) 500 MG CAPS Take 1 capsule by mouth daily.   aspirin 81 MG tablet Take 81 mg by mouth daily.   atorvastatin (LIPITOR) 80 MG tablet TAKE 1 TABLET(80 MG) BY MOUTH DAILY (Patient taking differently: Take 80 mg by mouth daily.)   Buprenorphine HCl-Naloxone HCl 8-2 MG FILM Place 1 Film under the tongue as needed (OD).   carvedilol (COREG) 6.25 MG tablet Take 6.25 mg by mouth 2 (two) times daily with a meal.   clonazePAM (KLONOPIN) 0.5 MG tablet Take 1 mg by mouth daily.   clopidogrel (PLAVIX) 75 MG tablet TAKE 1 TABLET(75 MG) BY MOUTH DAILY (Patient taking differently: Take 75 mg by mouth daily.)   cyanocobalamin 1000 MCG tablet Take 4,000 mcg by mouth daily.   cyclobenzaprine (FLEXERIL) 10 MG tablet Take 10 mg by mouth 3 (three) times daily as needed for muscle spasms. For muscle pain   ezetimibe (ZETIA) 10 MG tablet TAKE 1 TABLET(10 MG) BY MOUTH DAILY (Patient taking differently: Take 10 mg by mouth daily.)   folic acid (FOLVITE) 638 MCG tablet Take 400 mcg by mouth daily.   gabapentin (NEURONTIN) 600 MG tablet Take 600 mg by mouth 4 (four) times  daily.   hydrochlorothiazide (HYDRODIURIL) 12.5 MG tablet Take 12.5 mg by mouth daily.   isosorbide mononitrate (IMDUR) 30 MG 24 hr tablet Take 1 tablet by mouth daily.   lisinopril (ZESTRIL) 20 MG tablet Take 20 mg by mouth 2 (two) times daily.   magnesium oxide (MAG-OX) 400 MG tablet Take 1 tablet by mouth 2 (two) times daily.   metFORMIN (GLUCOPHAGE) 500 MG tablet Take 500 mg by mouth 2 (two) times daily.   naloxone (NARCAN) nasal spray 4 mg/0.1 mL Place 1 spray into the nose once.   nicotine (NICODERM CQ - DOSED IN MG/24 HOURS) 14 mg/24hr patch Place 1 patch (14 mg total) onto the skin daily.   nicotine (NICODERM CQ - DOSED IN MG/24 HOURS) 21 mg/24hr patch  Place 1 patch (21 mg total) onto the skin daily.   nicotine (NICODERM CQ - DOSED IN MG/24 HR) 7 mg/24hr patch Place 1 patch (7 mg total) onto the skin daily.   nitroGLYCERIN (NITROSTAT) 0.4 MG SL tablet Place 1 tablet (0.4 mg total) under the tongue every 5 (five) minutes as needed for chest pain.   ondansetron (ZOFRAN) 4 MG tablet Take 1 tablet (4 mg total) by mouth every 8 (eight) hours as needed for nausea or vomiting.   pantoprazole (PROTONIX) 40 MG tablet Take 40 mg by mouth daily.   potassium gluconate 595 (99 K) MG TABS tablet Take 595 mg by mouth 2 (two) times daily.   ranolazine (RANEXA) 500 MG 12 hr tablet Take 1 tablet (500 mg total) by mouth 2 (two) times daily.   [DISCONTINUED] alendronate (FOSAMAX) 35 MG tablet Take 35 mg by mouth every 7 (seven) days.   [DISCONTINUED] vitamin B-6 (PYRIDOXINE) 25 MG tablet Take 25 mg by mouth daily.     Allergies:   Septra [bactrim] and Sulfamethoxazole-trimethoprim   Social History   Socioeconomic History   Marital status: Married    Spouse name: Not on file   Number of children: 2   Years of education: 12   Highest education level: High school graduate  Occupational History   Occupation: disabled  Tobacco Use   Smoking status: Every Day    Packs/day: 1.00    Years: 40.00     Total pack years: 40.00    Types: Cigarettes   Smokeless tobacco: Never  Vaping Use   Vaping Use: Never used  Substance and Sexual Activity   Alcohol use: Not Currently    Alcohol/week: 21.0 standard drinks of alcohol    Types: 21 Standard drinks or equivalent per week    Comment: 3 mikes hard lemonades a day   Drug use: No    Types: Heroin    Comment: 1.5 yr. use of Heroin- 1990's    Sexual activity: Not Currently  Other Topics Concern   Not on file  Social History Narrative   Not on file   Social Determinants of Health   Financial Resource Strain: Not on file  Food Insecurity: Not on file  Transportation Needs: Not on file  Physical Activity: Not on file  Stress: Not on file  Social Connections: Not on file     Family History: The patient's family history includes COPD in her father; Cancer in her father; Colon cancer in her paternal uncle; Diabetes in her mother; Heart disease in her mother; Hyperlipidemia in her mother; Hypertension in her mother; Pancreatitis in her mother; Throat cancer in her maternal uncle. There is no history of Anesthesia problems. ROS:   Please see the history of present illness.    All 14 point review of systems negative except as described per history of present illness  EKGs/Labs/Other Studies Reviewed:      Recent Labs: 03/26/2021: BUN 12; Creatinine 0.8; Potassium 3.9; Sodium 135 05/24/2021: ALT 37 08/06/2021: Hemoglobin 14.1; Platelets 161  Recent Lipid Panel    Component Value Date/Time   CHOL 134 12/31/2019 1440   TRIG 77 12/31/2019 1440   HDL 38 (L) 12/31/2019 1440   CHOLHDL 3.5 12/31/2019 1440   LDLCALC 81 12/31/2019 1440    Physical Exam:    VS:  BP (!) 90/58 (BP Location: Left Arm, Patient Position: Sitting)   Pulse 61   Ht '5\' 4"'$  (1.626 m)   Wt 143 lb 9.6 oz (65.1 kg)  SpO2 92%   BMI 24.65 kg/m     Wt Readings from Last 3 Encounters:  09/17/21 143 lb 9.6 oz (65.1 kg)  08/06/21 147 lb 1.6 oz (66.7 kg)  04/03/21 146  lb (66.2 kg)     GEN:  Well nourished, well developed in no acute distress HEENT: Normal NECK: No JVD; No carotid bruits LYMPHATICS: No lymphadenopathy CARDIAC: RRR, no murmurs, no rubs, no gallops RESPIRATORY:  Clear to auscultation without rales, wheezing or rhonchi  ABDOMEN: Soft, non-tender, non-distended MUSCULOSKELETAL:  No edema; No deformity  SKIN: Warm and dry LOWER EXTREMITIES: no swelling NEUROLOGIC:  Alert and oriented x 3 PSYCHIATRIC:  Normal affect   ASSESSMENT:    1. Coronary artery disease involving native coronary artery of native heart without angina pectoris   2. Essential hypertension   3. Diabetic polyneuropathy associated with type 2 diabetes mellitus ([place])   4. Dyslipidemia   5. Smoking    PLAN:    In order of problems listed above:  Coronary disease status post multiple intervention I continue dual antiplatelet therapy.  Her symptomatology she presented with today look more like GI.  I will ask her to continue following up with GI, she did already schedule appointment with them.  I asked her to get Maalox and Mylanta and try it when she have the symptomatology.  I will schedule her to have troponin I of since she told me that within the last 24 hours she had a lot of this sensation.  However as described above I doubt this is cardiac etiology. Essential hypertension blood pressure seems to well controlled, will continue present management dyslipidemia I did review K PN which only her LDL of 58 HDL 47 she is already taking Lipitor 80 which I will continue.  Also Zetia. Smoking obviously he is probably spent at least 10 minutes talking about it I strongly recommended to quit we discussed different options in terms of weight quit.  Hopefully she will be able to do that    Medication Adjustments/Labs and Tests Ordered: Current medicines are reviewed at length with the patient today.  Concerns regarding medicines are outlined above.  No orders of the defined  types were placed in this encounter.  Medication changes: No orders of the defined types were placed in this encounter.   Signed, Park Liter, MD, [facility] 09/17/2021 2:50 PM    [facility]

## 2021-09-17 NOTE — Patient Instructions (Signed)
Medication Instructions:  Your physician recommends that you continue on your current medications as directed. Please refer to the Current Medication list given to you today.  *If you need a refill on your cardiac medications before your next appointment, please call your pharmacy*   Lab Work: Troponin today If you have labs (blood work) drawn today and your tests are completely normal, you will receive your results only by: [place] (if you have MyChart) OR A paper copy in the mail If you have any lab test that is abnormal or we need to change your treatment, we will call you to review the results.   Testing/Procedures: None Ordered   Follow-Up: At [facility], you and your health needs are our priority.  As part of our continuing mission to provide you with exceptional heart care, we have created designated Provider Care Teams.  These Care Teams include your primary Cardiologist (physician) and Advanced Practice Providers (APPs -  Physician Assistants and Nurse Practitioners) who all work together to provide you with the care you need, when you need it.  We recommend signing up for the patient portal called "MyChart".  Sign up information is provided on this After Visit Summary.  MyChart is used to connect with patients for Virtual Visits (Telemedicine).  Patients are able to view lab/test results, encounter notes, upcoming appointments, etc.  Non-urgent messages can be sent to your provider as well.   To learn more about what you can do with MyChart, go to [email or web address].    Your next appointment:   3 month(s)  The format for your next appointment:   In Person  Provider:   Jenne Campus, MD    Other Instructions NA

## 2021-09-18 ENCOUNTER — Telehealth: Payer: Self-pay

## 2021-09-18 DIAGNOSIS — I2510 Atherosclerotic heart disease of native coronary artery without angina pectoris: Secondary | ICD-10-CM | POA: Diagnosis not present

## 2021-09-18 DIAGNOSIS — F411 Generalized anxiety disorder: Secondary | ICD-10-CM | POA: Diagnosis not present

## 2021-09-18 DIAGNOSIS — D509 Iron deficiency anemia, unspecified: Secondary | ICD-10-CM | POA: Diagnosis not present

## 2021-09-18 DIAGNOSIS — Z6823 Body mass index (BMI) 23.0-23.9, adult: Secondary | ICD-10-CM | POA: Diagnosis not present

## 2021-09-18 DIAGNOSIS — E782 Mixed hyperlipidemia: Secondary | ICD-10-CM | POA: Diagnosis not present

## 2021-09-18 DIAGNOSIS — E11610 Type 2 diabetes mellitus with diabetic neuropathic arthropathy: Secondary | ICD-10-CM | POA: Diagnosis not present

## 2021-09-18 NOTE — Telephone Encounter (Signed)
Patient notified of results.

## 2021-09-18 NOTE — Telephone Encounter (Signed)
-----   Message from Park Liter, MD sent at 09/18/2021 12:23 PM EDT ----- Troponin I is normal, no evidence of heart attack

## 2021-09-19 DIAGNOSIS — F331 Major depressive disorder, recurrent, moderate: Secondary | ICD-10-CM | POA: Diagnosis not present

## 2021-09-19 DIAGNOSIS — F1120 Opioid dependence, uncomplicated: Secondary | ICD-10-CM | POA: Diagnosis not present

## 2021-09-19 DIAGNOSIS — F411 Generalized anxiety disorder: Secondary | ICD-10-CM | POA: Diagnosis not present

## 2021-09-20 ENCOUNTER — Other Ambulatory Visit: Payer: Self-pay

## 2021-09-20 NOTE — Patient Outreach (Signed)
  Care Coordination   Outreach  Visit Note   09/20/2021 Name: Makayla Fischer MRN: [id number] DOB: [birth year]  Makayla Fischer is a 61 y.o. year old female who sees Marga Hoots, NP for primary care. I spoke with  Wynema Birch by phone today  What matters to the patients health and wellness today?  Placed call to patient and explained [facility] care coordination program.  Reviewed as a free benefit.  Patient reports that she is doing well. Denies any needs at this time.     SDOH assessments and interventions completed:  Yes     Care Coordination Interventions Activated:  No  Care Coordination Interventions:  No, not indicated   Follow up plan: No further intervention required.   Encounter Outcome:  Pt. Refused   Tomasa Rand, RN, BSN, CEN [facility] [employer] [phone]

## 2021-09-25 ENCOUNTER — Telehealth: Payer: Self-pay

## 2021-09-25 DIAGNOSIS — Z9181 History of falling: Secondary | ICD-10-CM | POA: Diagnosis not present

## 2021-09-25 DIAGNOSIS — Z0000 Encounter for general adult medical examination without abnormal findings: Secondary | ICD-10-CM | POA: Diagnosis not present

## 2021-09-25 DIAGNOSIS — Z1331 Encounter for screening for depression: Secondary | ICD-10-CM | POA: Diagnosis not present

## 2021-09-25 NOTE — Telephone Encounter (Signed)
   [facility] Pre-operative Risk Assessment    Request for surgical clearance:  What type of surgery is being performed? Dental Extraction and placement    When is this surgery scheduled? TBD   What type of clearance is required (medical clearance vs. Pharmacy clearance to hold med vs. Both)? Pharmacy  Are there any medications that need to be held prior to surgery and how long?Blood thinner but name not specified( They stated patient mention she was on a blood thinner.    Practice name and name of physician performing surgery? Dr. Theador Hawthorne at [facility]   What is your office phone number: [phone]    7.   What is your office fax number: [phone]  8.   Anesthesia type (None, local, MAC, general) ? Articaine 4%, Lidocaine 2%, Marcaine 0.50%, Carbocaine 3% and carbocaine 2%   Makayla Fischer 09/25/2021, 2:06 PM  _________________________________________________________________   (provider comments below)

## 2021-09-26 NOTE — Telephone Encounter (Signed)
Call placed to [facility] to confirm # of extractions.  Had to leave a message for them to call back or fax back the clarification.

## 2021-09-26 NOTE — Telephone Encounter (Signed)
Need clarification on extent of extraction as single extractions do not require clearance or holding of meds. Richardson Dopp, PA-C    09/26/2021 8:08 AM

## 2021-09-27 ENCOUNTER — Other Ambulatory Visit: Payer: Self-pay | Admitting: Hematology and Oncology

## 2021-09-27 NOTE — Telephone Encounter (Signed)
S/w the dental office and confirmed the pt will be having 10 teeth extracted

## 2021-10-02 NOTE — Telephone Encounter (Signed)
Dr. Agustin Cree, we have received a request from patient to have 10 extractions for dentures. He saw the patient on 09/17/2021 at which time she complained of chest pain but had a negative troponin.  Do you agree that she may proceed to surgery without further cardiac testing?  Please route your response to p cv div preop.  Thank you, [name], NP-C    10/02/2021, 12:42 PM [address] Office [phone] Fax [phone]

## 2021-10-03 DIAGNOSIS — F1120 Opioid dependence, uncomplicated: Secondary | ICD-10-CM | POA: Diagnosis not present

## 2021-10-03 DIAGNOSIS — Z6823 Body mass index (BMI) 23.0-23.9, adult: Secondary | ICD-10-CM | POA: Diagnosis not present

## 2021-10-03 DIAGNOSIS — Z79891 Long term (current) use of opiate analgesic: Secondary | ICD-10-CM | POA: Diagnosis not present

## 2021-10-03 DIAGNOSIS — F411 Generalized anxiety disorder: Secondary | ICD-10-CM | POA: Diagnosis not present

## 2021-10-03 DIAGNOSIS — F331 Major depressive disorder, recurrent, moderate: Secondary | ICD-10-CM | POA: Diagnosis not present

## 2021-10-04 NOTE — Telephone Encounter (Signed)
Primary Cardiologist:Robert Agustin Cree, MD  Chart reviewed as part of pre-operative protocol coverage. Because of Makayla Fischer's past medical history and time since last visit, he/she will require a follow-up visit in order to better assess preoperative cardiovascular risk.  Pre-op covering staff: - Dr. Agustin Cree has requested to see the patient in the office prior to providing clearance. Please schedule patient sooner appointment. Current appointment is 12/18/21.  - Please contact requesting surgeon's office via preferred method (i.e, phone, fax) to inform them of need for appointment prior to surgery.   [name], NP-C     10/04/2021, 4:25 PM [address] Office [phone] Fax [phone]

## 2021-10-05 NOTE — Telephone Encounter (Signed)
Pt scheduled sooner to see Dr. Agustin Cree for pre op clearance on 10/10/21.

## 2021-10-05 NOTE — Telephone Encounter (Signed)
I will send a message to the [place] office to see if they can get the pt in sooner with Dr. Agustin Cree. Per Dr. Agustin Cree he wants to see her in the office 1 more time before giving clearance.

## 2021-10-10 ENCOUNTER — Ambulatory Visit: Payer: Medicare HMO | Attending: Cardiology | Admitting: Cardiology

## 2021-10-10 ENCOUNTER — Encounter: Payer: Self-pay | Admitting: Cardiology

## 2021-10-10 VITALS — BP 92/50 | HR 60 | Ht 64.0 in | Wt 144.8 lb

## 2021-10-10 DIAGNOSIS — Z01810 Encounter for preprocedural cardiovascular examination: Secondary | ICD-10-CM

## 2021-10-10 DIAGNOSIS — I2510 Atherosclerotic heart disease of native coronary artery without angina pectoris: Secondary | ICD-10-CM | POA: Diagnosis not present

## 2021-10-10 DIAGNOSIS — F17200 Nicotine dependence, unspecified, uncomplicated: Secondary | ICD-10-CM

## 2021-10-10 DIAGNOSIS — I10 Essential (primary) hypertension: Secondary | ICD-10-CM

## 2021-10-10 DIAGNOSIS — E785 Hyperlipidemia, unspecified: Secondary | ICD-10-CM | POA: Diagnosis not present

## 2021-10-10 NOTE — Progress Notes (Signed)
Cardiology Office Note:    Date:  10/10/2021   ID:  Makayla Fischer, DOB March 03, 1960, MRN [id number]  PCP:  Marga Hoots, NP  Cardiologist:  Jenne Campus, MD    Referring MD: Carvel Getting Key, *   Chief Complaint  Patient presents with   follow up clearance     History of Present Illness:    Makayla Fischer is a 61 y.o. female   with past medical history significant for coronary artery disease status post PTCA and stenting with drug-eluting stent to circumflex artery in August 2020, prior she did have PTCA and stenting of LAD.  In March of this year she did have a stress test done around the hospital which was negative, past medical history is also significant for essential hypertension, dyslipidemia, chronic smoking In August 16, 2020 she did have another cardiac catheterization done and she required stenting to the left anterior descending artery. She was referred back to [place] because she required multiple teeth extraction and she want to have a clearance before the surgery.  I understand a stent that will be done probably with sedation and local anesthesia.  Since I seen her last time she tells me she is doing better.  She denies having any chest pain/abdominal pain.  Previously pain look more like GI in etiology and she is scheduled to see GI doctor next month.  At the same time she is able to walk climb stairs with no difficulty.  She goes every single day with her dog to the football field and she play with the dog with no difficulties.  Pretty good effort without problems.  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 Posey Rea, stress test in 01/2011   Hypomagnesemia 08/10/2017   Hyponatremia 08/10/2017   Incisional hernia    with obstruction   Myocardial infarction ([place]) 02/2008   Neuromuscular disorder ([place])    neuropathy   Osteoporosis    Pulmonary nodule less than 6 mm in diameter with high risk for malignant neoplasm 11/23/2019   Shortness of breath    Status post insertion of drug eluting coronary artery stent 09/15/2018   Substance abuse ([place])    drugs and alcohol   Tobacco abuse 08/10/2017   Type II diabetes mellitus, well controlled ([place]) 00/93/8182   Uncomplicated opioid dependence ([place]) 08/10/2017   Unstable angina ([place]) 09/15/2018   Varicose veins of leg with complications 99/37/1696   Varicose veins of lower extremities with other complications 78/93/8101   Varicose veins of lower extremity 10/12/2013    Past Surgical History:  Procedure Laterality Date   CARDIAC CATHETERIZATION     02/2008- [facility], x2 stents    CARDIAC  SURGERY     CHOLECYSTECTOMY     CORONARY STENT INTERVENTION N/A 08/16/2020   Procedure: CORONARY STENT INTERVENTION;  Surgeon: Wellington Hampshire, MD;  Location: [facility];  Service: Cardiovascular;  Laterality: N/A;   INTRAVASCULAR IMAGING/OCT N/A 08/16/2020   Procedure: INTRAVASCULAR IMAGING/OCT;  Surgeon: Wellington Hampshire, MD;  Location: [facility];  Service: Cardiovascular;  Laterality: N/A;   INTRAVASCULAR PRESSURE WIRE/FFR STUDY N/A 08/16/2020   Procedure: INTRAVASCULAR PRESSURE WIRE/FFR STUDY;  Surgeon: Wellington Hampshire, MD;  Location: [facility];  Service: Cardiovascular;   Laterality: N/A;   LEFT HEART CATH AND CORONARY ANGIOGRAPHY N/A 08/16/2020   Procedure: LEFT HEART CATH AND CORONARY ANGIOGRAPHY;  Surgeon: Wellington Hampshire, MD;  Location: [facility];  Service: Cardiovascular;  Laterality: N/A;   MANDIBLE FRACTURE SURGERY     1980   TONSILLECTOMY AND ADENOIDECTOMY     TUBAL LIGATION     VENTRAL HERNIA REPAIR  07/12/2011   Procedure: LAPAROSCOPIC VENTRAL HERNIA;  Surgeon: Adin Hector, MD;  Location: [facility];  Service: General;  Laterality: N/A;  laparoscopic repair incisional hernia with mesh    Current Medications: Current Meds  Medication Sig   albuterol (VENTOLIN HFA) 108 (90 Base) MCG/ACT inhaler Inhale 2 puffs into the lungs every 6 (six) hours as needed for wheezing or shortness of breath.   amLODipine (NORVASC) 2.5 MG tablet Take 2.5 mg by mouth daily.   Ascorbic Acid (VITAMIN C) 500 MG CAPS Take 1 capsule by mouth daily.   aspirin 81 MG tablet Take 81 mg by mouth daily.   atorvastatin (LIPITOR) 80 MG tablet TAKE 1 TABLET(80 MG) BY MOUTH DAILY (Patient taking differently: Take 80 mg by mouth daily.)   Buprenorphine HCl-Naloxone HCl 8-2 MG FILM Place 1 Film under the tongue 2 (two) times daily.   carvedilol (COREG) 6.25 MG tablet Take 6.25 mg by mouth 2 (two) times daily with a meal.   clonazePAM (KLONOPIN) 0.5 MG tablet Take 1 mg by mouth daily.   clopidogrel (PLAVIX) 75 MG tablet TAKE 1 TABLET(75 MG) BY MOUTH DAILY (Patient taking differently: Take 75 mg by mouth daily.)   cyanocobalamin 1000 MCG tablet Take 4,000 mcg by mouth daily.   cyclobenzaprine (FLEXERIL) 10 MG tablet Take 10 mg by mouth 3 (three) times daily as needed for muscle spasms. For muscle pain   ezetimibe (ZETIA) 10 MG tablet TAKE 1 TABLET(10 MG) BY MOUTH DAILY (Patient taking differently: Take 10 mg by mouth daily.)   folic acid (FOLVITE) 409 MCG tablet Take 400 mcg by mouth daily.   gabapentin (NEURONTIN) 600 MG tablet Take 600 mg by mouth 4 (four) times daily.    hydrochlorothiazide (HYDRODIURIL) 12.5 MG tablet Take 12.5 mg by mouth daily.   isosorbide mononitrate (IMDUR) 30 MG 24 hr tablet Take 1 tablet by mouth daily.   lisinopril (ZESTRIL) 20 MG tablet Take 20 mg by mouth 2 (two) times daily.   magnesium oxide (MAG-OX) 400 MG tablet Take 1 tablet by mouth 2 (two) times daily.   metFORMIN (GLUCOPHAGE) 500 MG tablet Take 500 mg by mouth 2 (two) times daily.   naloxone (NARCAN) nasal spray 4 mg/0.1 mL Place 1 spray into the nose once.   nicotine (NICODERM CQ - DOSED IN MG/24 HOURS) 14 mg/24hr patch Place 1 patch (14 mg total) onto the skin daily.   nicotine (NICODERM CQ - DOSED IN MG/24 HOURS) 21 mg/24hr patch Place 1 patch (21 mg total) onto the  skin daily.   nicotine (NICODERM CQ - DOSED IN MG/24 HR) 7 mg/24hr patch Place 1 patch (7 mg total) onto the skin daily.   nitroGLYCERIN (NITROSTAT) 0.4 MG SL tablet Place 1 tablet (0.4 mg total) under the tongue every 5 (five) minutes as needed for chest pain.   ondansetron (ZOFRAN) 4 MG tablet Take 1 tablet (4 mg total) by mouth every 8 (eight) hours as needed for nausea or vomiting.   pantoprazole (PROTONIX) 40 MG tablet Take 40 mg by mouth daily.   potassium gluconate 595 (99 K) MG TABS tablet Take 595 mg by mouth 2 (two) times daily.   ranolazine (RANEXA) 500 MG 12 hr tablet Take 1 tablet (500 mg total) by mouth 2 (two) times daily.     Allergies:   Septra [bactrim] and Sulfamethoxazole-trimethoprim   Social History   Socioeconomic History   Marital status: Married    Spouse name: Not on file   Number of children: 2   Years of education: 12   Highest education level: High school graduate  Occupational History   Occupation: disabled  Tobacco Use   Smoking status: Every Day    Packs/day: 1.00    Years: 40.00    Total pack years: 40.00    Types: Cigarettes   Smokeless tobacco: Never  Vaping Use   Vaping Use: Never used  Substance and Sexual Activity   Alcohol use: Not Currently     Alcohol/week: 21.0 standard drinks of alcohol    Types: 21 Standard drinks or equivalent per week    Comment: 3 mikes hard lemonades a day   Drug use: No    Types: Heroin    Comment: 1.5 yr. use of Heroin- 1990's    Sexual activity: Not Currently  Other Topics Concern   Not on file  Social History Narrative   Not on file   Social Determinants of Health   Financial Resource Strain: Not on file  Food Insecurity: Not on file  Transportation Needs: No Transportation Needs (09/20/2021)   PRAPARE - Transportation    Lack of Transportation (Medical): No    Lack of Transportation (Non-Medical): No  Physical Activity: Not on file  Stress: Not on file  Social Connections: Not on file     Family History: The patient's family history includes COPD in her father; Cancer in her father; Colon cancer in her paternal uncle; Diabetes in her mother; Heart disease in her mother; Hyperlipidemia in her mother; Hypertension in her mother; Pancreatitis in her mother; Throat cancer in her maternal uncle. There is no history of Anesthesia problems. ROS:   Please see the history of present illness.    All 14 point review of systems negative except as described per history of present illness  EKGs/Labs/Other Studies Reviewed:      Recent Labs: 03/26/2021: BUN 12; Creatinine 0.8; Potassium 3.9; Sodium 135 05/24/2021: ALT 37 08/06/2021: Hemoglobin 14.1; Platelets 161  Recent Lipid Panel    Component Value Date/Time   CHOL 134 12/31/2019 1440   TRIG 77 12/31/2019 1440   HDL 38 (L) 12/31/2019 1440   CHOLHDL 3.5 12/31/2019 1440   LDLCALC 81 12/31/2019 1440    Physical Exam:    VS:  BP (!) 92/50 (BP Location: Left Arm, Patient Position: Sitting)   Pulse 60   Ht '5\' 4"'$  (1.626 m)   Wt 144 lb 12.8 oz (65.7 kg)   SpO2 94%   BMI 24.85 kg/m     Wt Readings from Last 3 Encounters:  10/10/21 144 lb 12.8 oz (65.7 kg)  09/17/21 143 lb 9.6 oz (65.1 kg)  08/06/21 147 lb 1.6 oz (66.7 kg)     GEN:  Well  nourished, well developed in no acute distress HEENT: Normal NECK: No JVD; No carotid bruits LYMPHATICS: No lymphadenopathy CARDIAC: RRR, no murmurs, no rubs, no gallops RESPIRATORY:  Clear to auscultation without rales, wheezing or rhonchi  ABDOMEN: Soft, non-tender, non-distended MUSCULOSKELETAL:  No edema; No deformity  SKIN: Warm and dry LOWER EXTREMITIES: no swelling NEUROLOGIC:  Alert and oriented x 3 PSYCHIATRIC:  Normal affect   ASSESSMENT:    1. Coronary artery disease involving native coronary artery of native heart without angina pectoris   2. Essential hypertension   3. Dyslipidemia   4. Smoking   5. Preop cardiovascular exam    PLAN:    In order of problems listed above:  Cardiovascular preop evaluation.  She did have last stenting done more than year ago.  She still on dual antiplatelet therapy.  She does not have any symptomatology suggesting reactivation of the problem.  On top of that her ability to exercise easily exceeds 4 METS, on top of that surgery will be done most likely sedation and local anesthetic therefore from cardiac standpoint review I see no objections again her surgery for multiple teeth extraction.  In terms of dual antiplatelet therapy, I prefer not to disrupt the therapy however if oral surgeon deemed that this is absolutely necessary I would suggest to stop Plavix 2 to 3 days before surgical intervention and then proceed also if dual antiplatelet therapy need to be interrupted completely I would suggest to do this no more than for 3 to 4 days. Essential hypertension blood pressure actually is low.  I will discontinue her hydrochlorothiazide today Dyslipidemia I did review her K PN which show me her LDL 57 HDL 42 this is from 20 seconds of all of this year she is on Lipitor 80 which I will continue Smoking again we had discussion about the need to discontinue that habit hopefully she will be able to accomplish   Medication Adjustments/Labs and Tests  Ordered: Current medicines are reviewed at length with the patient today.  Concerns regarding medicines are outlined above.  No orders of the defined types were placed in this encounter.  Medication changes: No orders of the defined types were placed in this encounter.   Signed, Park Liter, MD, [facility] 10/10/2021 2:56 PM    [place]

## 2021-10-10 NOTE — Addendum Note (Signed)
Addended by: Jacobo Forest D on: 10/10/2021 03:17 PM   Modules accepted: Orders

## 2021-10-10 NOTE — Patient Instructions (Addendum)
Medication Instructions:  Your physician has recommended you make the following change in your medication: Stop: Hydrochlorothiazide    Lab Work: None Ordered If you have labs (blood work) drawn today and your tests are completely normal, you will receive your results only by: [place] (if you have MyChart) OR A paper copy in the mail If you have any lab test that is abnormal or we need to change your treatment, we will call you to review the results.   Testing/Procedures: None Ordered   Follow-Up: At [facility], you and your health needs are our priority.  As part of our continuing mission to provide you with exceptional heart care, we have created designated Provider Care Teams.  These Care Teams include your primary Cardiologist (physician) and Advanced Practice Providers (APPs -  Physician Assistants and Nurse Practitioners) who all work together to provide you with the care you need, when you need it.  We recommend signing up for the patient portal called "MyChart".  Sign up information is provided on this After Visit Summary.  MyChart is used to connect with patients for Virtual Visits (Telemedicine).  Patients are able to view lab/test results, encounter notes, upcoming appointments, etc.  Non-urgent messages can be sent to your provider as well.   To learn more about what you can do with MyChart, go to [email or web address].    Your next appointment:   5 month(s)  The format for your next appointment:   In Person  Provider:   Jenne Campus, MD    Other Instructions NA

## 2021-10-11 ENCOUNTER — Telehealth: Payer: Self-pay | Admitting: Cardiology

## 2021-10-11 NOTE — Telephone Encounter (Signed)
Will route to callback to aide with missing information. Will remove from preop APP box until clearance is complete - please route back.

## 2021-10-11 NOTE — Telephone Encounter (Signed)
   Pre-operative Risk Assessment    Patient Name: Makayla Fischer  DOB: [birth year] MRN: [id number]     Request for Surgical Clearance    Procedure:  Dental Extraction - Amount of Teeth to be Pulled:  Pending  Date of Surgery:  Clearance TBD                                 Surgeon:    Surgeon's Group or Practice Name:   Phone number:   Fax number:     Type of Clearance Requested:   - Medical    Type of Anesthesia:  Not Indicated   Additional requests/questions:   Caller stated she will be faxing further information.  Call was disconnected before this form could be completed.  Signed, Heloise Beecham   10/11/2021, 3:58 PM

## 2021-10-12 NOTE — Telephone Encounter (Signed)
See clearance notes entered in 09/25/21

## 2021-10-12 NOTE — Telephone Encounter (Signed)
   Primary Cardiologist: Jenne Campus, MD  Chart reviewed as part of pre-operative protocol coverage. Given past medical history and time since last visit, based on ACC/AHA guidelines, KIMELA MALSTROM would be at acceptable risk for the planned procedure without further cardiovascular testing.    From cardiac standpoint review I see no objections again her surgery for multiple teeth extraction.  In terms of dual antiplatelet therapy, I prefer not to disrupt the therapy however if oral surgeon deemed that this is absolutely necessary I would suggest to stop Plavix 2 to 3 days before surgical intervention and then proceed also if dual antiplatelet therapy need to be interrupted completely I would suggest to do this no more than for 3 to 4 days.  I will route this recommendation to the requesting party via Epic fax function and remove from pre-op pool.  Please call with questions.   [name], NP-C  10/12/2021, 4:19 PM [address] Office [phone] Fax [phone]

## 2021-10-12 NOTE — Telephone Encounter (Signed)
Patient is having 10 extractions  Surgeon: Dr. Theador Hawthorne  Requesting office needs recommendations on how long patient should hold Plavix   Anesthesia: Local  Phone: [phone] Fax: [phone]

## 2021-10-24 DIAGNOSIS — F331 Major depressive disorder, recurrent, moderate: Secondary | ICD-10-CM | POA: Diagnosis not present

## 2021-10-24 DIAGNOSIS — F1120 Opioid dependence, uncomplicated: Secondary | ICD-10-CM | POA: Diagnosis not present

## 2021-10-24 DIAGNOSIS — F411 Generalized anxiety disorder: Secondary | ICD-10-CM | POA: Diagnosis not present

## 2021-10-30 ENCOUNTER — Other Ambulatory Visit: Payer: Self-pay

## 2021-11-06 ENCOUNTER — Ambulatory Visit: Payer: Medicare HMO | Admitting: Hematology and Oncology

## 2021-11-06 ENCOUNTER — Other Ambulatory Visit: Payer: Medicare HMO

## 2021-11-20 DIAGNOSIS — Z7982 Long term (current) use of aspirin: Secondary | ICD-10-CM | POA: Diagnosis not present

## 2021-11-20 DIAGNOSIS — Z7983 Long term (current) use of bisphosphonates: Secondary | ICD-10-CM | POA: Diagnosis not present

## 2021-11-20 DIAGNOSIS — K91840 Postprocedural hemorrhage and hematoma of a digestive system organ or structure following a digestive system procedure: Secondary | ICD-10-CM | POA: Diagnosis not present

## 2021-11-20 DIAGNOSIS — F17210 Nicotine dependence, cigarettes, uncomplicated: Secondary | ICD-10-CM | POA: Diagnosis not present

## 2021-11-20 DIAGNOSIS — I10 Essential (primary) hypertension: Secondary | ICD-10-CM | POA: Diagnosis not present

## 2021-11-20 DIAGNOSIS — Z98818 Other dental procedure status: Secondary | ICD-10-CM | POA: Diagnosis not present

## 2021-11-20 DIAGNOSIS — F1090 Alcohol use, unspecified, uncomplicated: Secondary | ICD-10-CM | POA: Diagnosis not present

## 2021-11-22 ENCOUNTER — Ambulatory Visit: Payer: Medicare HMO | Admitting: Hematology and Oncology

## 2021-11-22 ENCOUNTER — Inpatient Hospital Stay: Payer: Medicare HMO

## 2021-11-28 DIAGNOSIS — F1120 Opioid dependence, uncomplicated: Secondary | ICD-10-CM | POA: Diagnosis not present

## 2021-11-28 DIAGNOSIS — F411 Generalized anxiety disorder: Secondary | ICD-10-CM | POA: Diagnosis not present

## 2021-11-28 DIAGNOSIS — F331 Major depressive disorder, recurrent, moderate: Secondary | ICD-10-CM | POA: Diagnosis not present

## 2021-12-02 IMAGING — DX DG CHEST 2V
2 series · 2 of 2 positions shown · non-contrast
Comparison: None.

CLINICAL DATA: Chest pain 08/15/2020

EXAM:
CHEST - 2 VIEW

[chest pa]
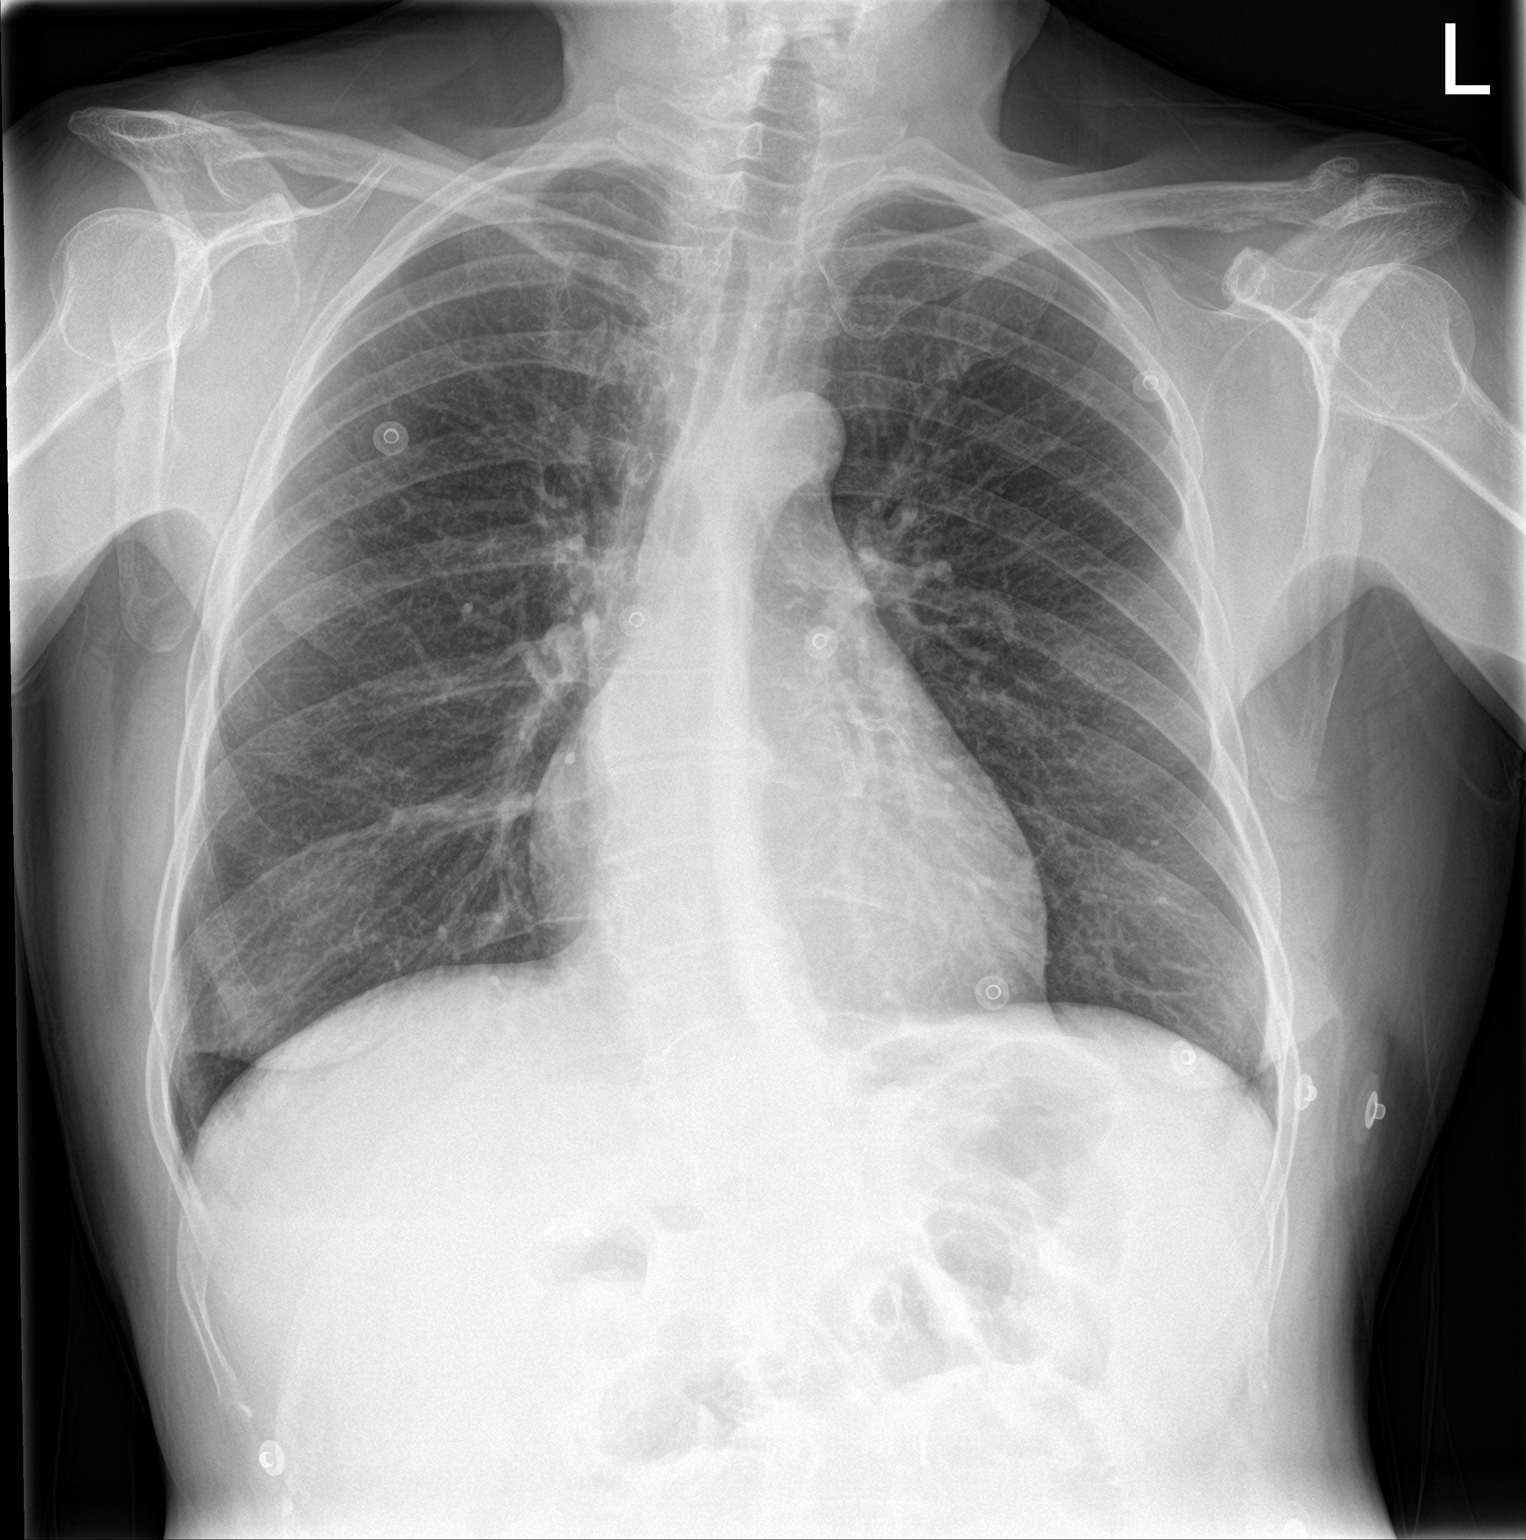

[chest lat]
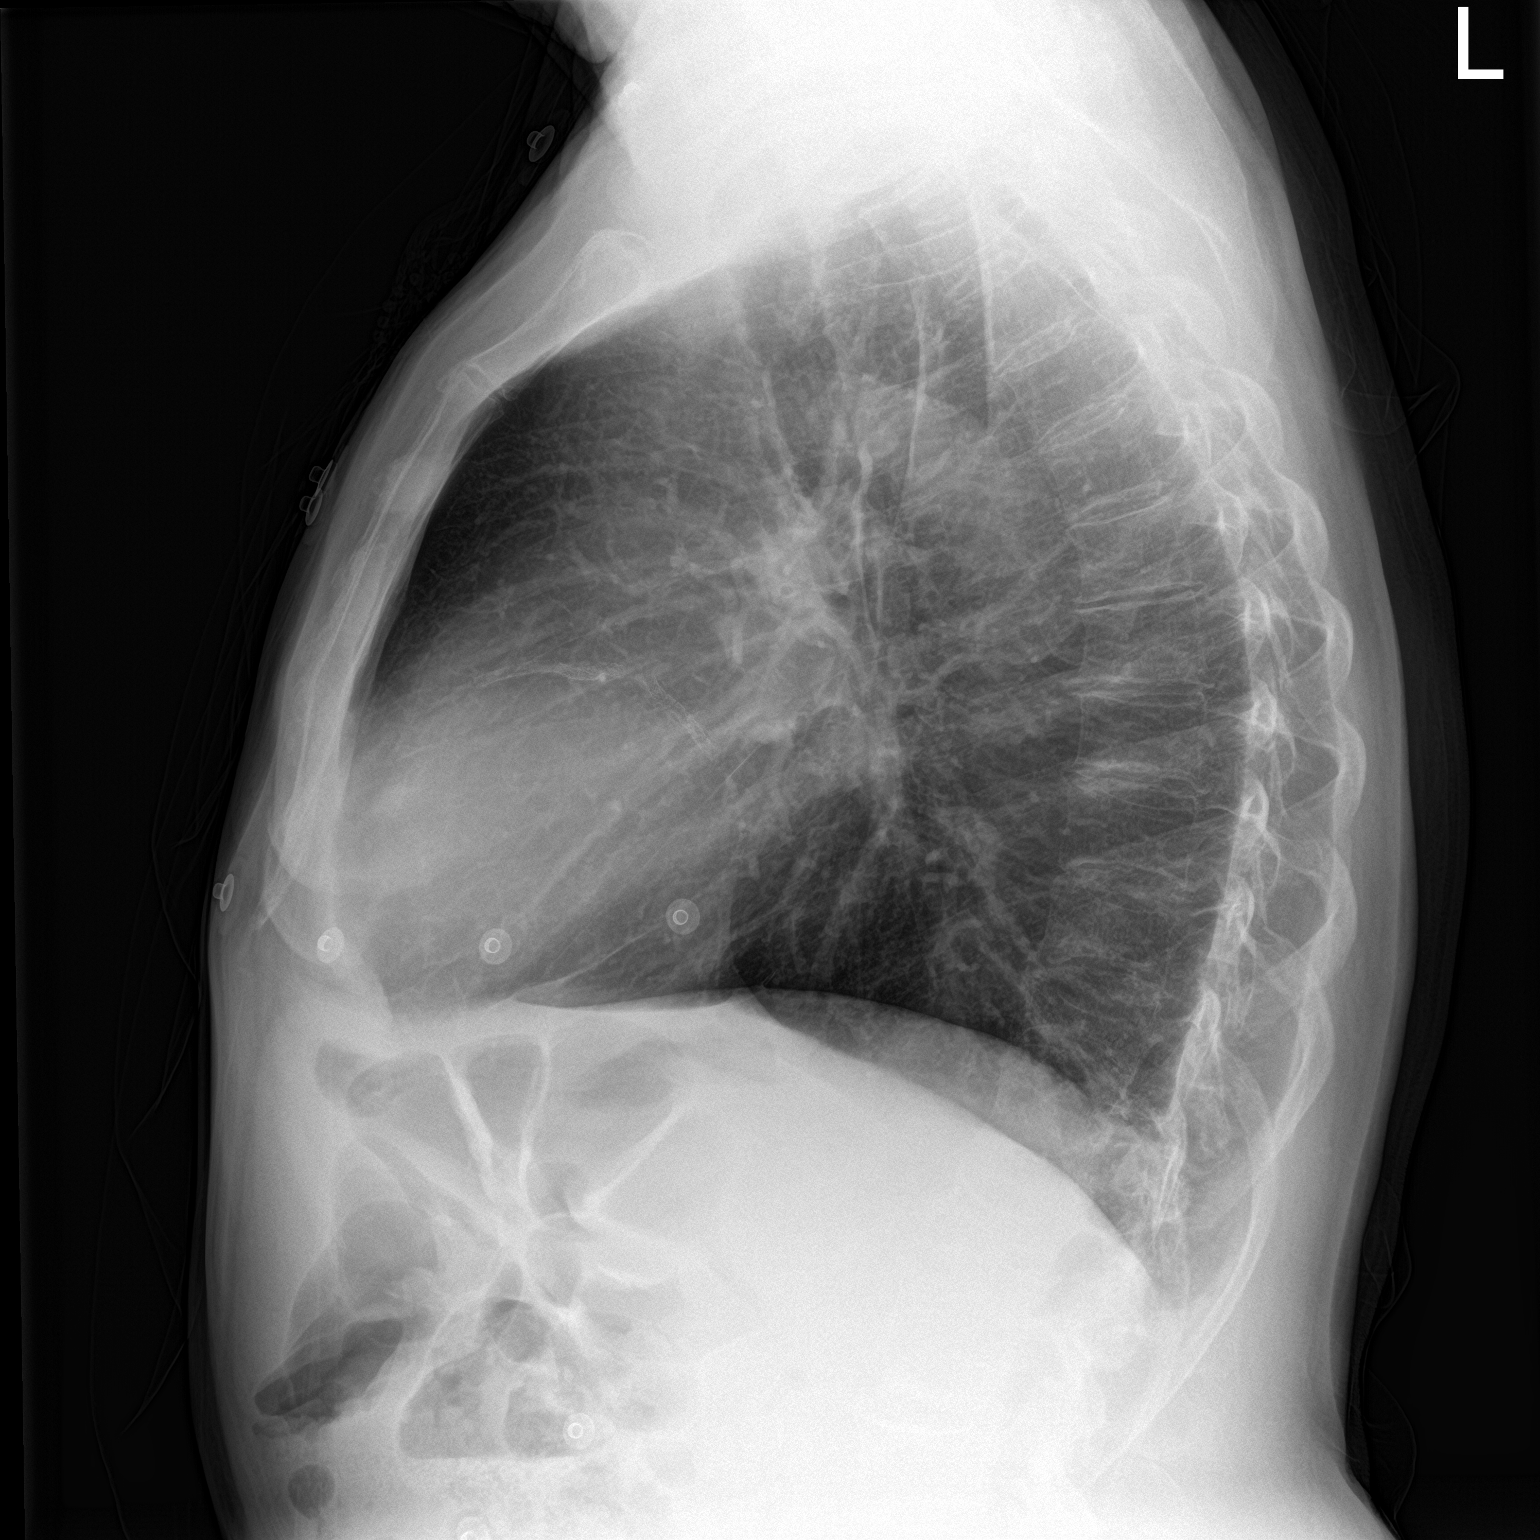

[2 of 2 positions shown; findings below may reference images not displayed]

FINDINGS: The heart size and mediastinal contours are within normal limits.
Both lungs are clear. Scoliosis
IMPRESSION: No active cardiopulmonary disease.

## 2021-12-04 ENCOUNTER — Ambulatory Visit: Payer: Medicare HMO | Admitting: Hematology and Oncology

## 2021-12-04 ENCOUNTER — Other Ambulatory Visit: Payer: Medicare HMO

## 2021-12-05 DIAGNOSIS — Z6823 Body mass index (BMI) 23.0-23.9, adult: Secondary | ICD-10-CM | POA: Diagnosis not present

## 2021-12-05 DIAGNOSIS — J329 Chronic sinusitis, unspecified: Secondary | ICD-10-CM | POA: Diagnosis not present

## 2021-12-11 ENCOUNTER — Inpatient Hospital Stay: Payer: Medicare HMO | Attending: Hematology and Oncology

## 2021-12-11 ENCOUNTER — Other Ambulatory Visit: Payer: Self-pay | Admitting: Hematology and Oncology

## 2021-12-11 ENCOUNTER — Inpatient Hospital Stay: Payer: Medicare HMO | Admitting: Hematology and Oncology

## 2021-12-11 ENCOUNTER — Encounter: Payer: Self-pay | Admitting: Hematology and Oncology

## 2021-12-11 VITALS — BP 152/70 | HR 64 | Temp 98.7°F | Resp 20 | Ht 64.0 in | Wt 143.5 lb

## 2021-12-11 DIAGNOSIS — D509 Iron deficiency anemia, unspecified: Secondary | ICD-10-CM

## 2021-12-11 DIAGNOSIS — Z720 Tobacco use: Secondary | ICD-10-CM | POA: Diagnosis not present

## 2021-12-11 DIAGNOSIS — F17200 Nicotine dependence, unspecified, uncomplicated: Secondary | ICD-10-CM

## 2021-12-11 LAB — CBC WITH DIFFERENTIAL (CANCER CENTER ONLY)
Abs Immature Granulocytes: 0.02 10*3/uL (ref 0.00–0.07)
Basophils Absolute: 0 10*3/uL (ref 0.0–0.1)
Basophils Relative: 1 %
Eosinophils Absolute: 0.3 10*3/uL (ref 0.0–0.5)
Eosinophils Relative: 4 %
HCT: 41.9 % (ref 36.0–46.0)
Hemoglobin: 13.9 g/dL (ref 12.0–15.0)
Immature Granulocytes: 0 %
Lymphocytes Relative: 33 %
Lymphs Abs: 2 10*3/uL (ref 0.7–4.0)
MCH: 31.3 pg (ref 26.0–34.0)
MCHC: 33.2 g/dL (ref 30.0–36.0)
MCV: 94.4 fL (ref 80.0–100.0)
Monocytes Absolute: 0.4 10*3/uL (ref 0.1–1.0)
Monocytes Relative: 6 %
Neutro Abs: 3.3 10*3/uL (ref 1.7–7.7)
Neutrophils Relative %: 56 %
Platelet Count: 198 10*3/uL (ref 150–400)
RBC: 4.44 MIL/uL (ref 3.87–5.11)
RDW: 12.5 % (ref 11.5–15.5)
WBC Count: 6 10*3/uL (ref 4.0–10.5)
nRBC: 0 % (ref 0.0–0.2)

## 2021-12-11 LAB — FERRITIN: Ferritin: 13 ng/mL (ref 11–307)

## 2021-12-11 LAB — IRON AND TIBC
Iron: 102 ug/dL (ref 28–170)
Saturation Ratios: 27 % (ref 10.4–31.8)
TIBC: 374 ug/dL (ref 250–450)
UIBC: 272 ug/dL

## 2021-12-11 MED ORDER — ONDANSETRON HCL 4 MG PO TABS: 4.0000 mg | ORAL_TABLET | Freq: Three times a day (TID) | ORAL | 0 refills | Status: DC | PRN

## 2021-12-11 NOTE — Assessment & Plan Note (Signed)
She continues to smoke 1 pack of cigarettes daily, which she has been doing since age 61 so she has a 38-pack-year history of smoking.  I therefore recommend annual low-dose CT chest for lung cancer screening.  As she had a CT chest in March of this year, she will be due for screening in March.  I discussed the risks and benefits of screening.  She is willing and able to undergo necessary follow-up.  We also discussed smoking cessation.  She has patches to use at home and is to set a quit date.

## 2021-12-11 NOTE — Progress Notes (Signed)
[facility]  [address] [place],  [place]  [address] [phone]  Clinic Day:  12/11/2021  Referring physician: Carvel Getting Key, *  ASSESSMENT & PLAN:   Assessment & Plan: Iron deficiency anemia 61 year old female who we began seeing in February 2023 with for iron deficiency anemia.  She was treated with Feraheme in March with a excellent response.  Her hemoglobin and iron studies remain normal. She will just now be seeing Dr. Lyda Jester later this week.  She has multiple GI complaints, which she will discuss with him.  She requests a refill of Zofran.  I will give her small amount, but I advised her typically we do not use Zofran on chronic basis. I will plan to see her back in 4 months for repeat clinical assessment.  Tobacco abuse She continues to smoke 1 pack of cigarettes daily, which she has been doing since age 22 so she has a 38-pack-year history of smoking.  I therefore recommend annual low-dose CT chest for lung cancer screening.  As she had a CT chest in March of this year, she will be due for screening in March.  I discussed the risks and benefits of screening.  She is willing and able to undergo necessary follow-up.  We also discussed smoking cessation.  She has patches to use at home and is to set a quit date.   The patient understands the plans discussed today and is in agreement with them.  She knows to contact our office if she develops concerns prior to her next appointment.    Makayla Pickles, PA-C  [facility] AT [facility] [address] [place] [address] Dept: [phone] Dept Fax: [phone]   Orders Placed This Encounter  Procedures   CT CHEST LUNG CA SCREEN LOW DOSE W/O CM    Standing Status:   Future    Standing Expiration Date:   12/12/2022    Order Specific Question:   Preferred Imaging Location?    Answer:   External   CBC with Differential (Cancer  Center Only)    Standing Status:   Future    Standing Expiration Date:   12/13/2022   Ferritin    Standing Status:   Future    Standing Expiration Date:   12/13/2022   Iron and TIBC    Standing Status:   Future    Standing Expiration Date:   12/13/2022      CHIEF COMPLAINT:  CC: Iron deficiency anemia  Current Treatment: Observation  HISTORY OF PRESENT ILLNESS:  The patient is a 61 year old who we began seeing in February for iron deficiency anemia.The patient had had chronic anemia with her hemoglobin running between 10 and 11 since July 2021 and gave a history of receiving IV iron in the past.  Her hemoglobin dropped to 9.4 in November 2022.  Iron studies were in February revealed a serum iron of 15, total iron binding capacity of 442, iron saturation of 3% and ferritin 7, which is consistent with iron deficiency.  She was given a prescription for Slow Fe, but did not try it, as she had severe nausea and vomiting with oral iron in the past.  She was given IV iron in the form of Feraheme with an excellent response. She was referred to Dr. Melina Copa, who switched her care to Dr. Lyda Jester.  INTERVAL HISTORY:  Makayla Fischer is here today for repeat clinical assessment.  She denies progressive fatigue concerning  for recurrent anemia.  She denies any overt form of blood loss.  She reports acid reflux, nausea after eating, abdominal pain and rectal bleeding, which she attributes to hemorrhoids.  She denies fevers or chills. She has chronic left shoulder pain. Her appetite is good. Her weight has decreased 1 pounds over last 6 months .  She states she is scheduled to see Dr. Lyda Jester later this week.  She states she has had multiple other issues which interfered with her ability to see him.  Including infections and having her teeth extracted.  She also reports intermittent left-sided chest pain.  She follows with Dr. Agustin Cree.  She continues to smoke cigarettes 1 pack/day.  She also drinks 3 hard  seltzers a day.  She reports having a liver biopsy and diagnosis of cirrhosis in the past.  I strongly advised her to avoid alcohol.  REVIEW OF SYSTEMS:  Review of Systems  Constitutional:  Negative for appetite change, chills, fatigue, fever and unexpected weight change.  HENT:   Negative for lump/mass, mouth sores and sore throat.   Respiratory:  Negative for cough and shortness of breath.   Cardiovascular:  Negative for chest pain and leg swelling.  Gastrointestinal:  Positive for blood in stool and constipation. Negative for abdominal pain, diarrhea, nausea, rectal pain and vomiting.  Endocrine: Negative for hot flashes.  Genitourinary:  Negative for difficulty urinating, dysuria, frequency and hematuria.   Musculoskeletal:  Positive for arthralgias. Negative for back pain and myalgias.  Skin:  Negative for rash.  Neurological:  Negative for dizziness and headaches.  Hematological:  Negative for adenopathy. Does not bruise/bleed easily.  Psychiatric/Behavioral:  Negative for depression and sleep disturbance. The patient is not nervous/anxious.      VITALS:  Blood pressure (!) 152/70, pulse 64, temperature 98.7 F (37.1 C), temperature source Oral, resp. rate 20, height '5\' 4"'$  (1.626 m), weight 143 lb 8 oz (65.1 kg), SpO2 98 %.  Wt Readings from Last 3 Encounters:  12/11/21 143 lb 8 oz (65.1 kg)  10/10/21 144 lb 12.8 oz (65.7 kg)  09/17/21 143 lb 9.6 oz (65.1 kg)    Body mass index is 24.63 kg/m.  Performance status (ECOG): 1 - Symptomatic but completely ambulatory  PHYSICAL EXAM:  Physical Exam Vitals and nursing note reviewed.  Constitutional:      General: She is not in acute distress.    Appearance: Normal appearance.  HENT:     Head: Normocephalic and atraumatic.     Mouth/Throat:     Mouth: Mucous membranes are moist.     Pharynx: Oropharynx is clear. No oropharyngeal exudate or posterior oropharyngeal erythema.  Eyes:     General: No scleral icterus.     Extraocular Movements: Extraocular movements intact.     Conjunctiva/sclera: Conjunctivae normal.     Pupils: Pupils are equal, round, and reactive to light.  Cardiovascular:     Rate and Rhythm: Normal rate and regular rhythm.     Heart sounds: Normal heart sounds. No murmur heard.    No friction rub. No gallop.  Pulmonary:     Effort: Pulmonary effort is normal.     Breath sounds: Normal breath sounds. No wheezing, rhonchi or rales.  Abdominal:     General: There is no distension.     Palpations: Abdomen is soft. There is no hepatomegaly, splenomegaly or mass.     Tenderness: There is no abdominal tenderness.  Musculoskeletal:        General: Normal range of motion.  Cervical back: Normal range of motion and neck supple. No tenderness.     Right lower leg: No edema.     Left lower leg: No edema.  Lymphadenopathy:     Cervical: No cervical adenopathy.     Upper Body:     Right upper body: No supraclavicular or axillary adenopathy.     Left upper body: No supraclavicular or axillary adenopathy.     Lower Body: No right inguinal adenopathy. No left inguinal adenopathy.  Skin:    General: Skin is warm and dry.     Coloration: Skin is not jaundiced.     Findings: No rash.  Neurological:     Mental Status: She is alert and oriented to person, place, and time.     Cranial Nerves: No cranial nerve deficit.  Psychiatric:        Mood and Affect: Mood normal.        Behavior: Behavior normal.        Thought Content: Thought content normal.    LABS:      Latest Ref Rng & Units 12/11/2021    2:50 PM 08/06/2021   12:00 AM 05/07/2021   12:00 AM  CBC  WBC 4.0 - 10.5 K/uL 6.0  5.0     5.0      Hemoglobin 12.0 - 15.0 g/dL 13.9  14.1     14.1      Hematocrit 36.0 - 46.0 % 41.9  41     43      Platelets 150 - 400 K/uL 198  161     177         This result is from an external source.      Latest Ref Rng & Units 05/24/2021    3:51 PM 03/26/2021   12:00 AM 09/07/2020    6:56 PM  CMP   Glucose 70 - 99 mg/dL   111   BUN 4 - '21  12  9   '$ Creatinine 0.5 - 1.1  0.8  0.69   Sodium 137 - 147  135  128   Potassium 3.4 - 5.3  3.9  3.9   Chloride 99 - 108  98  90   CO2 13 - 22  32  29   Calcium 8.7 - 10.7  8.8  8.9   Alkaline Phos 25 - 125  77    AST 0 - 40 IU/L 42  38    ALT 0 - 32 IU/L 37  17       No results found for: "CEA1", "CEA" / No results found for: "CEA1", "CEA" No results found for: "PSA1" No results found for: "YSA630" No results found for: "CAN125"  No results found for: "TOTALPROTELP", "ALBUMINELP", "A1GS", "A2GS", "BETS", "BETA2SER", "GAMS", "MSPIKE", "SPEI" Lab Results  Component Value Date   TIBC 374 12/11/2021   TIBC 337 08/06/2021   TIBC 364 05/07/2021   FERRITIN 13 12/11/2021   FERRITIN 20 08/06/2021   FERRITIN 26 05/07/2021   IRONPCTSAT 27 12/11/2021   IRONPCTSAT 42 (H) 08/06/2021   IRONPCTSAT 28 05/07/2021   No results found for: "LDH"  STUDIES:  No results found.    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 Posey Rea, stress test in 01/2011   Hypomagnesemia 08/10/2017   Hyponatremia 08/10/2017   Incisional hernia    with obstruction   Myocardial infarction ([place]) 02/2008   Neuromuscular disorder ([place])    neuropathy    Osteoporosis    Pulmonary nodule less than 6 mm in diameter with high risk for malignant neoplasm 11/23/2019   Shortness of breath    Status post insertion of drug eluting coronary artery stent 09/15/2018   Substance abuse ([place])    drugs and alcohol   Tobacco abuse 08/10/2017   Type II diabetes mellitus, well controlled ([place]) 38/18/2993   Uncomplicated opioid dependence ([place]) 08/10/2017   Unstable angina ([place]) 09/15/2018   Varicose veins of leg with complications 71/69/6789   Varicose veins of lower extremities with other complications 38/10/1749   Varicose veins of lower extremity 10/12/2013    Past Surgical History:  Procedure Laterality Date   CARDIAC CATHETERIZATION     02/2008- [facility] Cola, x2 stents    CARDIAC SURGERY     CHOLECYSTECTOMY     CORONARY STENT INTERVENTION N/A 08/16/2020   Procedure: CORONARY STENT INTERVENTION;  Surgeon: Wellington Hampshire, MD;  Location: [facility];  Service: Cardiovascular;  Laterality: N/A;   INTRAVASCULAR IMAGING/OCT N/A 08/16/2020   Procedure: INTRAVASCULAR IMAGING/OCT;  Surgeon: Wellington Hampshire, MD;  Location: [facility];  Service: Cardiovascular;  Laterality: N/A;   INTRAVASCULAR PRESSURE WIRE/FFR STUDY N/A 08/16/2020   Procedure: INTRAVASCULAR PRESSURE WIRE/FFR STUDY;  Surgeon: Wellington Hampshire, MD;  Location: [facility];  Service: Cardiovascular;  Laterality: N/A;   LEFT HEART CATH AND CORONARY ANGIOGRAPHY N/A 08/16/2020   Procedure: LEFT HEART CATH AND CORONARY ANGIOGRAPHY;  Surgeon: Wellington Hampshire, MD;  Location: [facility];  Service: Cardiovascular;  Laterality: N/A;   MANDIBLE FRACTURE SURGERY     1980   TONSILLECTOMY AND ADENOIDECTOMY     TUBAL LIGATION     VENTRAL HERNIA REPAIR  07/12/2011   Procedure: LAPAROSCOPIC VENTRAL HERNIA;  Surgeon: Adin Hector, MD;  Location: [facility];  Service: General;  Laterality: N/A;  laparoscopic repair incisional hernia with mesh    Family History  Problem  Relation Age of Onset   Diabetes Mother    Hyperlipidemia Mother    Hypertension Mother    Heart disease Mother    Pancreatitis Mother    Cancer Father        lymphoma   COPD Father    Throat cancer Maternal Uncle    Colon cancer Paternal Uncle    Anesthesia problems Neg Hx     Social History:  reports that she has been smoking cigarettes. She has a 40.00 pack-year smoking history. She has never used smokeless tobacco. She reports that she does not currently use alcohol after a past usage of about 21.0 standard drinks of alcohol per week. She reports that she does not use drugs.The patient is alone today.  Allergies:  Allergies  Allergen Reactions   Septra [Bactrim] Shortness Of Breath and Itching   Sulfamethoxazole-Trimethoprim Itching, Shortness Of Breath, Anaphylaxis and Other (See Comments)    Current Medications: Current Outpatient Medications  Medication Sig Dispense Refill   amoxicillin-clavulanate (AUGMENTIN) 875-125 MG tablet Take 1 tablet by mouth 2 (two) times daily.     albuterol (VENTOLIN HFA) 108 (90 Base) MCG/ACT inhaler Inhale 2 puffs into the lungs every 6 (six) hours as needed for wheezing or shortness of breath.     amLODipine (NORVASC) 2.5 MG tablet Take 2.5 mg by mouth daily.     Ascorbic Acid (VITAMIN C) 500 MG CAPS Take 1 capsule by mouth daily.     aspirin 81 MG tablet Take 81 mg by mouth daily.     atorvastatin (LIPITOR) 80 MG tablet TAKE 1 TABLET(80 MG) BY MOUTH DAILY (Patient taking differently: Take 80 mg by mouth daily.) 90 tablet 3   Buprenorphine HCl-Naloxone HCl 8-2 MG FILM Place 1 Film under the tongue 2 (two) times daily.     carvedilol (COREG) 6.25 MG tablet Take 6.25 mg by mouth 2 (two) times daily with a meal.     clonazePAM (KLONOPIN) 0.5 MG tablet Take 1 mg by mouth daily.     clonazePAM (KLONOPIN) 1 MG tablet      clopidogrel (PLAVIX) 75 MG tablet TAKE 1 TABLET(75 MG) BY MOUTH DAILY (Patient taking differently: Take 75 mg by mouth daily.) 90  tablet 3   cyanocobalamin 1000 MCG tablet Take 4,000 mcg by mouth daily.     cyclobenzaprine (FLEXERIL) 10 MG tablet Take 10 mg by mouth 3 (three) times daily as needed for muscle spasms. For muscle pain     ezetimibe (ZETIA) 10 MG tablet TAKE 1 TABLET(10 MG) BY MOUTH DAILY (Patient taking differently: Take 10 mg by mouth daily.) 90 tablet 2   folic acid (FOLVITE) 353 MCG tablet Take 400 mcg by mouth daily.     gabapentin (NEURONTIN) 600 MG tablet Take 600 mg by mouth 4 (four) times daily.     isosorbide mononitrate (IMDUR) 30 MG 24 hr tablet Take 1 tablet by mouth daily.     lisinopril (ZESTRIL) 20 MG tablet Take 20 mg by mouth 2 (two) times daily.     magnesium oxide (MAG-OX) 400 MG tablet Take 1 tablet by mouth 2 (two) times daily.     metFORMIN (GLUCOPHAGE) 500 MG tablet Take 500 mg by mouth 2 (two) times daily.     naloxone (NARCAN) nasal spray 4 mg/0.1 mL Place 1 spray into the nose once.     nicotine (NICODERM CQ - DOSED IN MG/24 HOURS) 14 mg/24hr patch Place 1 patch (14 mg total) onto the skin daily. (Patient not taking: Reported on 12/11/2021) 14 patch 0   nicotine (NICODERM CQ - DOSED IN MG/24 HOURS) 21 mg/24hr patch Place 1 patch (21 mg total) onto the skin daily. (Patient not taking: Reported on 12/11/2021) 14 patch 0   nicotine (NICODERM CQ - DOSED IN MG/24 HR) 7 mg/24hr patch Place 1 patch (7 mg total) onto the skin daily. (Patient not taking: Reported on 12/11/2021) 14 patch 0   nitroGLYCERIN (NITROSTAT) 0.4 MG SL tablet Place 1 tablet (0.4 mg total) under the tongue every 5 (five) minutes as needed for chest pain. 25 tablet 5   ondansetron (ZOFRAN) 4 MG tablet Take 1 tablet (4 mg total) by mouth every 8 (eight) hours as needed for nausea or vomiting. 30 tablet 0   pantoprazole (PROTONIX) 40  MG tablet Take 40 mg by mouth daily.     potassium gluconate 595 (99 K) MG TABS tablet Take 595 mg by mouth 2 (two) times daily.     ranolazine (RANEXA) 500 MG 12 hr tablet Take 1 tablet (500 mg  total) by mouth 2 (two) times daily. 180 tablet 3   No current facility-administered medications for this visit.

## 2021-12-11 NOTE — Assessment & Plan Note (Addendum)
61 year old female who we began seeing in February 2023 with for iron deficiency anemia.  She was treated with Feraheme in March with a excellent response.  She denies any overt blood loss.  She will just now be seeing Dr. Lyda Jester later this week.  She has multiple GI complaints, which she will discuss with him.  She requests a refill of Zofran.  I will give her small amount, but I advised her typically we do not use Zofran on chronic basis.

## 2021-12-12 ENCOUNTER — Encounter: Payer: Self-pay | Admitting: Hematology and Oncology

## 2021-12-12 ENCOUNTER — Telehealth: Payer: Self-pay

## 2021-12-12 NOTE — Telephone Encounter (Signed)
-----   Message from Marvia Pickles, PA-C sent at 12/12/2021  8:15 AM EST ----- Please tell her hemoglobin and iron stores normal. She still really needs to see Dr. Lyda Jester. Thanks

## 2021-12-12 NOTE — Telephone Encounter (Signed)
Patient voiced understanding and also voiced appt with GI tommorow.

## 2021-12-13 DIAGNOSIS — K7460 Unspecified cirrhosis of liver: Secondary | ICD-10-CM | POA: Diagnosis not present

## 2021-12-13 DIAGNOSIS — K5900 Constipation, unspecified: Secondary | ICD-10-CM | POA: Diagnosis not present

## 2021-12-13 DIAGNOSIS — E119 Type 2 diabetes mellitus without complications: Secondary | ICD-10-CM | POA: Diagnosis not present

## 2021-12-13 DIAGNOSIS — K219 Gastro-esophageal reflux disease without esophagitis: Secondary | ICD-10-CM | POA: Diagnosis not present

## 2021-12-13 DIAGNOSIS — R1310 Dysphagia, unspecified: Secondary | ICD-10-CM | POA: Diagnosis not present

## 2021-12-17 ENCOUNTER — Telehealth (INDEPENDENT_AMBULATORY_CARE_PROVIDER_SITE_OTHER): Payer: Self-pay

## 2021-12-17 NOTE — Telephone Encounter (Signed)
   [facility] Pre-operative Risk Assessment    Request for surgical clearance:  What type of surgery is being performed? EGD   When is this surgery scheduled? TBD   What type of clearance is required (medical clearance vs. Pharmacy clearance to hold med vs. Both)? Pharmacy  Are there any medications that need to be held prior to surgery and how long?Requesting to hold Plavix ad Asipirin 5 days prior to procedure date   Practice name and name of physician performing surgery? Dr. Kyra Leyland  at [facility]   What is your office phone number: [phone]    7.   What is your office fax number: [phone]  8.   Anesthesia type (None, local, MAC, general) ? Not specified    Makayla Fischer 12/17/2021, 2:23 PM  _________________________________________________________________   (provider comments below)

## 2021-12-17 NOTE — Telephone Encounter (Signed)
     Primary Cardiologist: Jenne Campus, MD  Chart reviewed as part of pre-operative protocol coverage. Given past medical history and time since last visit, based on ACC/AHA guidelines, Makayla Fischer would be at acceptable risk for the planned procedure without further cardiovascular testing.   Her Plavix may be held for 5 days prior to her procedure.  Her aspirin should be continued throughout her procedure.  Please resume her Plavix as soon as hemostasis is achieved.  I will route this recommendation to the requesting party via Epic fax function and remove from pre-op pool.  Please call with questions.  Jossie Ng. Nirvan Laban NP-C     12/17/2021, 2:37 PM [facility] [address] Office [phone] Fax [phone]

## 2021-12-18 ENCOUNTER — Ambulatory Visit: Payer: Medicare HMO | Admitting: Cardiology

## 2021-12-21 DIAGNOSIS — E11610 Type 2 diabetes mellitus with diabetic neuropathic arthropathy: Secondary | ICD-10-CM | POA: Diagnosis not present

## 2021-12-21 DIAGNOSIS — D509 Iron deficiency anemia, unspecified: Secondary | ICD-10-CM | POA: Diagnosis not present

## 2021-12-21 DIAGNOSIS — E782 Mixed hyperlipidemia: Secondary | ICD-10-CM | POA: Diagnosis not present

## 2021-12-21 DIAGNOSIS — M549 Dorsalgia, unspecified: Secondary | ICD-10-CM | POA: Diagnosis not present

## 2021-12-21 DIAGNOSIS — I2510 Atherosclerotic heart disease of native coronary artery without angina pectoris: Secondary | ICD-10-CM | POA: Diagnosis not present

## 2021-12-21 DIAGNOSIS — R1310 Dysphagia, unspecified: Secondary | ICD-10-CM | POA: Diagnosis not present

## 2021-12-21 DIAGNOSIS — G8929 Other chronic pain: Secondary | ICD-10-CM | POA: Diagnosis not present

## 2021-12-21 DIAGNOSIS — Z23 Encounter for immunization: Secondary | ICD-10-CM | POA: Diagnosis not present

## 2021-12-21 DIAGNOSIS — F411 Generalized anxiety disorder: Secondary | ICD-10-CM | POA: Diagnosis not present

## 2021-12-25 DIAGNOSIS — F331 Major depressive disorder, recurrent, moderate: Secondary | ICD-10-CM | POA: Diagnosis not present

## 2021-12-25 DIAGNOSIS — Z79891 Long term (current) use of opiate analgesic: Secondary | ICD-10-CM | POA: Diagnosis not present

## 2021-12-25 DIAGNOSIS — F411 Generalized anxiety disorder: Secondary | ICD-10-CM | POA: Diagnosis not present

## 2021-12-25 DIAGNOSIS — F1120 Opioid dependence, uncomplicated: Secondary | ICD-10-CM | POA: Diagnosis not present

## 2021-12-25 DIAGNOSIS — Z6823 Body mass index (BMI) 23.0-23.9, adult: Secondary | ICD-10-CM | POA: Diagnosis not present

## 2021-12-31 DIAGNOSIS — G8929 Other chronic pain: Secondary | ICD-10-CM | POA: Diagnosis not present

## 2021-12-31 DIAGNOSIS — M5090 Cervical disc disorder, unspecified, unspecified cervical region: Secondary | ICD-10-CM | POA: Diagnosis not present

## 2021-12-31 DIAGNOSIS — M25512 Pain in left shoulder: Secondary | ICD-10-CM | POA: Diagnosis not present

## 2022-01-08 DIAGNOSIS — F331 Major depressive disorder, recurrent, moderate: Secondary | ICD-10-CM | POA: Diagnosis not present

## 2022-01-08 DIAGNOSIS — F411 Generalized anxiety disorder: Secondary | ICD-10-CM | POA: Diagnosis not present

## 2022-01-08 DIAGNOSIS — F1120 Opioid dependence, uncomplicated: Secondary | ICD-10-CM | POA: Diagnosis not present

## 2022-01-18 DIAGNOSIS — M47812 Spondylosis without myelopathy or radiculopathy, cervical region: Secondary | ICD-10-CM | POA: Diagnosis not present

## 2022-01-18 DIAGNOSIS — M5021 Other cervical disc displacement,  high cervical region: Secondary | ICD-10-CM | POA: Diagnosis not present

## 2022-01-18 DIAGNOSIS — M50222 Other cervical disc displacement at C5-C6 level: Secondary | ICD-10-CM | POA: Diagnosis not present

## 2022-01-18 DIAGNOSIS — M4802 Spinal stenosis, cervical region: Secondary | ICD-10-CM | POA: Diagnosis not present

## 2022-01-18 DIAGNOSIS — M5412 Radiculopathy, cervical region: Secondary | ICD-10-CM | POA: Diagnosis not present

## 2022-01-19 ENCOUNTER — Other Ambulatory Visit: Payer: Self-pay | Admitting: Cardiology

## 2022-01-19 DIAGNOSIS — E782 Mixed hyperlipidemia: Secondary | ICD-10-CM

## 2022-01-19 DIAGNOSIS — I25118 Atherosclerotic heart disease of native coronary artery with other forms of angina pectoris: Secondary | ICD-10-CM

## 2022-01-22 DIAGNOSIS — F331 Major depressive disorder, recurrent, moderate: Secondary | ICD-10-CM | POA: Diagnosis not present

## 2022-01-22 DIAGNOSIS — Z79891 Long term (current) use of opiate analgesic: Secondary | ICD-10-CM | POA: Diagnosis not present

## 2022-01-22 DIAGNOSIS — F411 Generalized anxiety disorder: Secondary | ICD-10-CM | POA: Diagnosis not present

## 2022-01-22 DIAGNOSIS — Z6824 Body mass index (BMI) 24.0-24.9, adult: Secondary | ICD-10-CM | POA: Diagnosis not present

## 2022-01-22 DIAGNOSIS — F1120 Opioid dependence, uncomplicated: Secondary | ICD-10-CM | POA: Diagnosis not present

## 2022-01-24 DIAGNOSIS — M5090 Cervical disc disorder, unspecified, unspecified cervical region: Secondary | ICD-10-CM | POA: Diagnosis not present

## 2022-01-29 DIAGNOSIS — K7460 Unspecified cirrhosis of liver: Secondary | ICD-10-CM | POA: Diagnosis not present

## 2022-01-29 DIAGNOSIS — K219 Gastro-esophageal reflux disease without esophagitis: Secondary | ICD-10-CM | POA: Diagnosis not present

## 2022-01-29 DIAGNOSIS — R1310 Dysphagia, unspecified: Secondary | ICD-10-CM | POA: Diagnosis not present

## 2022-01-29 DIAGNOSIS — K5900 Constipation, unspecified: Secondary | ICD-10-CM | POA: Diagnosis not present

## 2022-01-29 DIAGNOSIS — E119 Type 2 diabetes mellitus without complications: Secondary | ICD-10-CM | POA: Diagnosis not present

## 2022-02-04 DIAGNOSIS — S46212A Strain of muscle, fascia and tendon of other parts of biceps, left arm, initial encounter: Secondary | ICD-10-CM | POA: Diagnosis not present

## 2022-02-04 DIAGNOSIS — D649 Anemia, unspecified: Secondary | ICD-10-CM | POA: Diagnosis not present

## 2022-02-05 DIAGNOSIS — F331 Major depressive disorder, recurrent, moderate: Secondary | ICD-10-CM | POA: Diagnosis not present

## 2022-02-05 DIAGNOSIS — F1120 Opioid dependence, uncomplicated: Secondary | ICD-10-CM | POA: Diagnosis not present

## 2022-02-05 DIAGNOSIS — F411 Generalized anxiety disorder: Secondary | ICD-10-CM | POA: Diagnosis not present

## 2022-02-19 DIAGNOSIS — Z1211 Encounter for screening for malignant neoplasm of colon: Secondary | ICD-10-CM | POA: Diagnosis not present

## 2022-02-19 DIAGNOSIS — E119 Type 2 diabetes mellitus without complications: Secondary | ICD-10-CM | POA: Diagnosis not present

## 2022-02-19 DIAGNOSIS — Z7902 Long term (current) use of antithrombotics/antiplatelets: Secondary | ICD-10-CM | POA: Diagnosis not present

## 2022-02-19 DIAGNOSIS — K219 Gastro-esophageal reflux disease without esophagitis: Secondary | ICD-10-CM | POA: Diagnosis not present

## 2022-02-19 DIAGNOSIS — Z79899 Other long term (current) drug therapy: Secondary | ICD-10-CM | POA: Diagnosis not present

## 2022-02-19 DIAGNOSIS — R194 Change in bowel habit: Secondary | ICD-10-CM | POA: Diagnosis not present

## 2022-02-19 DIAGNOSIS — Z955 Presence of coronary angioplasty implant and graft: Secondary | ICD-10-CM | POA: Diagnosis not present

## 2022-02-19 DIAGNOSIS — K222 Esophageal obstruction: Secondary | ICD-10-CM | POA: Diagnosis not present

## 2022-02-19 DIAGNOSIS — I2510 Atherosclerotic heart disease of native coronary artery without angina pectoris: Secondary | ICD-10-CM | POA: Diagnosis not present

## 2022-02-19 DIAGNOSIS — Z538 Procedure and treatment not carried out for other reasons: Secondary | ICD-10-CM | POA: Diagnosis not present

## 2022-02-19 DIAGNOSIS — I10 Essential (primary) hypertension: Secondary | ICD-10-CM | POA: Diagnosis not present

## 2022-02-19 DIAGNOSIS — K2270 Barrett's esophagus without dysplasia: Secondary | ICD-10-CM | POA: Diagnosis not present

## 2022-02-19 DIAGNOSIS — R1310 Dysphagia, unspecified: Secondary | ICD-10-CM | POA: Diagnosis not present

## 2022-02-26 DIAGNOSIS — F411 Generalized anxiety disorder: Secondary | ICD-10-CM | POA: Diagnosis not present

## 2022-02-26 DIAGNOSIS — Z79891 Long term (current) use of opiate analgesic: Secondary | ICD-10-CM | POA: Diagnosis not present

## 2022-02-26 DIAGNOSIS — F331 Major depressive disorder, recurrent, moderate: Secondary | ICD-10-CM | POA: Diagnosis not present

## 2022-02-26 DIAGNOSIS — Z6824 Body mass index (BMI) 24.0-24.9, adult: Secondary | ICD-10-CM | POA: Diagnosis not present

## 2022-02-26 DIAGNOSIS — F1120 Opioid dependence, uncomplicated: Secondary | ICD-10-CM | POA: Diagnosis not present

## 2022-03-08 DIAGNOSIS — M545 Low back pain, unspecified: Secondary | ICD-10-CM | POA: Diagnosis not present

## 2022-03-12 ENCOUNTER — Encounter: Payer: Self-pay | Admitting: Cardiology

## 2022-03-12 ENCOUNTER — Ambulatory Visit: Payer: Medicare HMO | Attending: Cardiology | Admitting: Cardiology

## 2022-03-12 VITALS — BP 134/78 | HR 64 | Ht 64.6 in | Wt 149.4 lb

## 2022-03-12 DIAGNOSIS — I10 Essential (primary) hypertension: Secondary | ICD-10-CM

## 2022-03-12 DIAGNOSIS — I2510 Atherosclerotic heart disease of native coronary artery without angina pectoris: Secondary | ICD-10-CM | POA: Diagnosis not present

## 2022-03-12 DIAGNOSIS — Z720 Tobacco use: Secondary | ICD-10-CM

## 2022-03-12 DIAGNOSIS — E782 Mixed hyperlipidemia: Secondary | ICD-10-CM

## 2022-03-12 DIAGNOSIS — E119 Type 2 diabetes mellitus without complications: Secondary | ICD-10-CM

## 2022-03-12 NOTE — Patient Instructions (Addendum)
Medication Instructions:  Your physician recommends that you continue on your current medications as directed. Please refer to the Current Medication list given to you today.  *If you need a refill on your cardiac medications before your next appointment, please call your pharmacy*   Lab Work: None ordered If you have labs (blood work) drawn today and your tests are completely normal, you will receive your results only by: [place] (if you have MyChart) OR A paper copy in the mail If you have any lab test that is abnormal or we need to change your treatment, we will call you to review the results.   Testing/Procedures: None ordered   Follow-Up: At [facility], you and your health needs are our priority.  As part of our continuing mission to provide you with exceptional heart care, we have created designated Provider Care Teams.  These Care Teams include your primary Cardiologist (physician) and Advanced Practice Providers (APPs -  Physician Assistants and Nurse Practitioners) who all work together to provide you with the care you need, when you need it.  We recommend signing up for the patient portal called "MyChart".  Sign up information is provided on this After Visit Summary.  MyChart is used to connect with patients for Virtual Visits (Telemedicine).  Patients are able to view lab/test results, encounter notes, upcoming appointments, etc.  Non-urgent messages can be sent to your provider as well.   To learn more about what you can do with MyChart, go to [email or web address].    Your next appointment:   6 month(s)  The format for your next appointment:   In Person  Provider:   Venia Carbon, NP [facility])    Other Instructions none  This visit was accompanied by Truddie Hidden, RN.   Important Information About Sugar

## 2022-03-12 NOTE — Progress Notes (Signed)
This visit was accompanied by Truddie Hidden, RN.

## 2022-03-12 NOTE — Progress Notes (Signed)
Cardiology Office Note:    Date:  03/12/2022   ID:  KADE Makayla Fischer, DOB March 14, 1960, MRN [id number]  PCP:  Makayla Hoots, NP  Cardiologist:  Makayla Campus, MD    Referring MD: Makayla Fischer, *   No chief complaint on file.   History of Present Illness:    Makayla Fischer is a 62 y.o. female   with past medical history significant for coronary artery disease status post PTCA and stenting with drug-eluting stent to circumflex artery in August 2020, prior she did have PTCA and stenting of LAD.  In March of this year she did have a stress test done around the hospital which was negative, past medical history is also significant for essential hypertension, dyslipidemia, chronic smoking In August 16, 2020 she did have another cardiac catheterization done and she required stenting to the left anterior descending artery. Last time I seen her there was before oral surgery she required multiple tooth extractions that being done and doing quite well after that.  Sadly dentures do not fit.  The biggest news that she has today is the fact that she quit smoking I congratulated her for it she still vapes but gradually trying to reduce amount of nicotine hopefully will be with completely discontinue. Cardiac wise doing well, denies have any chest pain tightness squeezing pressure burning chest.  Interestingly he is joking that since she stopped smoking chest pain went away.  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 Posey Rea, stress test in 01/2011   Hypomagnesemia 08/10/2017   Hyponatremia 08/10/2017   Incisional hernia    with obstruction   Myocardial infarction ([place]) 02/2008   Neuromuscular disorder ([place])    neuropathy   Osteoporosis    Pulmonary nodule less than 6 mm in diameter with high risk for malignant neoplasm 11/23/2019   Shortness of breath    Status post insertion of drug eluting coronary artery stent 09/15/2018   Substance abuse ([place])    drugs and alcohol   Tobacco abuse 08/10/2017   Type II diabetes mellitus, well controlled ([place]) 0000000   Uncomplicated opioid dependence ([place]) 08/10/2017   Unstable angina ([place]) 09/15/2018   Varicose veins of leg with complications [id number]   Varicose veins of lower extremities with other complications [id number]   Varicose veins of lower extremity 10/12/2013    Past Surgical History:  Procedure Laterality Date   CARDIAC CATHETERIZATION     02/2008- Makayla Fischer, x2 stents    CARDIAC SURGERY     CHOLECYSTECTOMY     CORONARY STENT INTERVENTION N/A 08/16/2020   Procedure: CORONARY STENT INTERVENTION;  Surgeon: Wellington Hampshire, MD;  Location: [facility];  Service: Cardiovascular;  Laterality: N/A;   INTRAVASCULAR IMAGING/OCT N/A 08/16/2020   Procedure: INTRAVASCULAR IMAGING/OCT;  Surgeon: Wellington Hampshire, MD;  Location: [facility];  Service: Cardiovascular;  Laterality: N/A;   INTRAVASCULAR PRESSURE WIRE/FFR STUDY N/A 08/16/2020   Procedure: INTRAVASCULAR PRESSURE WIRE/FFR STUDY;  Surgeon: Wellington Hampshire, MD;  Location: [facility];  Service: Cardiovascular;  Laterality: N/A;   LEFT HEART CATH AND CORONARY ANGIOGRAPHY N/A 08/16/2020   Procedure: LEFT HEART CATH AND CORONARY  ANGIOGRAPHY;  Surgeon: Wellington Hampshire, MD;  Location: [facility];  Service: Cardiovascular;  Laterality: N/A;   MANDIBLE FRACTURE SURGERY     1980   TONSILLECTOMY AND ADENOIDECTOMY     TUBAL LIGATION     VENTRAL HERNIA REPAIR  07/12/2011   Procedure: LAPAROSCOPIC VENTRAL HERNIA;  Surgeon: Makayla Hector, MD;  Location: [facility];  Service: General;  Laterality: N/A;  laparoscopic repair incisional hernia with mesh    Current Medications: Current Meds  Medication Sig   albuterol (VENTOLIN HFA) 108 (90 Base) MCG/ACT inhaler Inhale 2 puffs into the lungs every 6 (six) hours as needed for wheezing or shortness of breath.   amLODipine (NORVASC) 2.5 MG tablet Take 2.5 mg by mouth daily.   Ascorbic Acid (VITAMIN C) 500 MG CAPS Take 1 capsule by mouth daily.   aspirin 81 MG tablet Take 81 mg by mouth daily.   atorvastatin (LIPITOR) 80 MG tablet Take 1 tablet (80 mg total) by mouth daily.   Buprenorphine HCl-Naloxone HCl 8-2 MG FILM Place 1 Film under the tongue 2 (two) times daily.   carvedilol (COREG) 6.25 MG tablet Take 6.25 mg by mouth 2 (two) times daily with a meal.   clonazePAM (KLONOPIN) 1 MG tablet Take 1 mg by mouth 2 (two) times daily.   clopidogrel (PLAVIX) 75 MG tablet Take 1 tablet (75 mg total) by mouth daily.   cyanocobalamin 1000 MCG tablet Take 4,000 mcg by mouth daily.   cyclobenzaprine (FLEXERIL) 10 MG tablet Take 10 mg by mouth 3 (three) times daily as needed for muscle spasms. For muscle pain   ezetimibe (ZETIA) 10 MG tablet TAKE 1 TABLET(10 MG) BY MOUTH DAILY   folic acid (FOLVITE) A999333 MCG tablet Take 400 mcg by mouth daily.   gabapentin (NEURONTIN) 600 MG tablet Take 600 mg by mouth 4 (four) times daily.   isosorbide mononitrate (IMDUR) 30 MG 24 hr tablet Take 1 tablet by mouth daily.   lisinopril (ZESTRIL) 20 MG tablet Take 20 mg by mouth 2 (two) times daily.   magnesium oxide (MAG-OX) 400 MG tablet Take 1 tablet by mouth 2 (two) times daily.   metFORMIN  (GLUCOPHAGE) 500 MG tablet Take 500 mg by mouth 2 (two) times daily.   naloxone (NARCAN) nasal spray 4 mg/0.1 mL Place 1 spray into the nose once.   nicotine (NICODERM CQ - DOSED IN MG/24 HOURS) 14 mg/24hr patch Place 1 patch (14 mg total) onto the skin daily.   nicotine (NICODERM CQ - DOSED IN MG/24 HOURS) 21 mg/24hr patch Place 1 patch (21 mg total) onto the skin daily.   nicotine (NICODERM CQ - DOSED IN MG/24 HR) 7 mg/24hr patch Place 1 patch (7 mg total) onto the skin daily.   nitroGLYCERIN (NITROSTAT) 0.4 MG SL tablet Place 1 tablet (0.4 mg total) under the tongue every 5 (five) minutes as needed for chest pain.   ondansetron (ZOFRAN) 4 MG tablet Take 1 tablet (4 mg total) by mouth every  8 (eight) hours as needed for nausea or vomiting.   pantoprazole (PROTONIX) 40 MG tablet Take 40 mg by mouth daily.   potassium gluconate 595 (99 K) MG TABS tablet Take 595 mg by mouth 2 (two) times daily.   ranolazine (RANEXA) 500 MG 12 hr tablet Take 1 tablet (500 mg total) by mouth 2 (two) times daily.     Allergies:   Septra [bactrim] and Sulfamethoxazole-trimethoprim   Social History   Socioeconomic History   Marital status: Married    Spouse name: Not on file   Number of children: 2   Years of education: 12   Highest education level: High school graduate  Occupational History   Occupation: disabled  Tobacco Use   Smoking status: Every Day    Packs/day: 1.00    Years: 40.00    Total pack years: 40.00    Types: Cigarettes, E-cigarettes    Last attempt to quit: 02/03/2022    Years since quitting: 0.1   Smokeless tobacco: Never  Vaping Use   Vaping Use: Never used  Substance and Sexual Activity   Alcohol use: Not Currently    Alcohol/week: 21.0 standard drinks of alcohol    Types: 21 Standard drinks or equivalent per week    Comment: 3 mikes hard lemonades a day   Drug use: No    Types: Heroin    Comment: 1.5 yr. use of Heroin- 1990's    Sexual activity: Not Currently  Other Topics  Concern   Not on file  Social History Narrative   Not on file   Social Determinants of Health   Financial Resource Strain: Not on file  Food Insecurity: Not on file  Transportation Needs: No Transportation Needs (09/20/2021)   PRAPARE - Transportation    Lack of Transportation (Medical): No    Lack of Transportation (Non-Medical): No  Physical Activity: Not on file  Stress: Not on file  Social Connections: Not on file     Family History: The patient's family history includes COPD in her father; Cancer in her father; Colon cancer in her paternal uncle; Diabetes in her mother; Heart disease in her mother; Hyperlipidemia in her mother; Hypertension in her mother; Pancreatitis in her mother; Throat cancer in her maternal uncle. There is no history of Anesthesia problems. ROS:   Please see the history of present illness.    All 14 point review of systems negative except as described per history of present illness  EKGs/Labs/Other Studies Reviewed:      Recent Labs: 03/26/2021: BUN 12; Creatinine 0.8; Potassium 3.9; Sodium 135 05/24/2021: ALT 37 12/11/2021: Hemoglobin 13.9; Platelet Count 198  Recent Lipid Panel    Component Value Date/Time   CHOL 134 12/31/2019 1440   TRIG 77 12/31/2019 1440   HDL 38 (L) 12/31/2019 1440   CHOLHDL 3.5 12/31/2019 1440   LDLCALC 81 12/31/2019 1440    Physical Exam:    VS:  BP 134/78   Pulse 64   Ht 5' 4.6" (1.641 m)   Wt 149 lb 6.4 oz (67.8 kg)   SpO2 94%   BMI 25.17 kg/m     Wt Readings from Last 3 Encounters:  03/12/22 149 lb 6.4 oz (67.8 kg)  12/11/21 143 lb 8 oz (65.1 kg)  10/10/21 144 lb 12.8 oz (65.7 kg)     GEN:  Well nourished, well developed in no acute distress HEENT: Normal NECK: No JVD; No carotid bruits LYMPHATICS: No lymphadenopathy CARDIAC: RRR, no murmurs, no rubs, no gallops RESPIRATORY:  Clear to auscultation without rales, wheezing or rhonchi  ABDOMEN: Soft, non-tender, non-distended MUSCULOSKELETAL:  No edema;  No deformity  SKIN: Warm and dry LOWER EXTREMITIES: no swelling NEUROLOGIC:  Alert and oriented x 3 PSYCHIATRIC:  Normal affect   ASSESSMENT:    1. Coronary artery disease involving native coronary artery of native heart without angina pectoris   2. Essential hypertension   3. Type II diabetes mellitus, well controlled ([place])   4. Mixed hyperlipidemia   5. Tobacco abuse    PLAN:    In order of problems listed above:  Coronary artery disease stable from that point review and appropriate medication which include antiplatelet therapy which I will continue. Dyslipidemia, she is taking Lipitor 80 as well as Zetia 10 I did review K PN which show me data from December 21, 2021 with LDL 58 HDL 48 good cholesterol profile continue present management. Type 2 diabetes that being followed by internal medicine team, last hemoglobin A1c is 5.5 from November of last year stable. Tobacco abuse again I congratulated her for quitting smoking.  She uses vapes which of course is not the most desirable but better than smoking I told her that ultimate goal will be completely discontinue.   Medication Adjustments/Labs and Tests Ordered: Current medicines are reviewed at length with the patient today.  Concerns regarding medicines are outlined above.  No orders of the defined types were placed in this encounter.  Medication changes: No orders of the defined types were placed in this encounter.   Signed, Park Liter, MD, [facility] 03/12/2022 2:07 PM    [place]

## 2022-03-18 ENCOUNTER — Other Ambulatory Visit: Payer: Self-pay | Admitting: Cardiology

## 2022-03-19 DIAGNOSIS — F1120 Opioid dependence, uncomplicated: Secondary | ICD-10-CM | POA: Diagnosis not present

## 2022-03-19 DIAGNOSIS — F331 Major depressive disorder, recurrent, moderate: Secondary | ICD-10-CM | POA: Diagnosis not present

## 2022-03-19 DIAGNOSIS — F411 Generalized anxiety disorder: Secondary | ICD-10-CM | POA: Diagnosis not present

## 2022-03-22 DIAGNOSIS — M549 Dorsalgia, unspecified: Secondary | ICD-10-CM | POA: Diagnosis not present

## 2022-03-22 DIAGNOSIS — E782 Mixed hyperlipidemia: Secondary | ICD-10-CM | POA: Diagnosis not present

## 2022-03-22 DIAGNOSIS — K7460 Unspecified cirrhosis of liver: Secondary | ICD-10-CM | POA: Diagnosis not present

## 2022-03-22 DIAGNOSIS — F411 Generalized anxiety disorder: Secondary | ICD-10-CM | POA: Diagnosis not present

## 2022-03-22 DIAGNOSIS — I2510 Atherosclerotic heart disease of native coronary artery without angina pectoris: Secondary | ICD-10-CM | POA: Diagnosis not present

## 2022-03-22 DIAGNOSIS — K219 Gastro-esophageal reflux disease without esophagitis: Secondary | ICD-10-CM | POA: Diagnosis not present

## 2022-03-22 DIAGNOSIS — G8929 Other chronic pain: Secondary | ICD-10-CM | POA: Diagnosis not present

## 2022-03-22 DIAGNOSIS — E11610 Type 2 diabetes mellitus with diabetic neuropathic arthropathy: Secondary | ICD-10-CM | POA: Diagnosis not present

## 2022-03-22 DIAGNOSIS — D509 Iron deficiency anemia, unspecified: Secondary | ICD-10-CM | POA: Diagnosis not present

## 2022-03-27 ENCOUNTER — Telehealth: Payer: Self-pay | Admitting: Hematology and Oncology

## 2022-03-27 NOTE — Telephone Encounter (Signed)
03/27/22 Spoke with patient and gave info on [facility] on 03/29/22'@145pm'$ 

## 2022-04-09 DIAGNOSIS — F411 Generalized anxiety disorder: Secondary | ICD-10-CM | POA: Diagnosis not present

## 2022-04-09 DIAGNOSIS — F1120 Opioid dependence, uncomplicated: Secondary | ICD-10-CM | POA: Diagnosis not present

## 2022-04-09 DIAGNOSIS — F331 Major depressive disorder, recurrent, moderate: Secondary | ICD-10-CM | POA: Diagnosis not present

## 2022-04-09 DIAGNOSIS — Z6824 Body mass index (BMI) 24.0-24.9, adult: Secondary | ICD-10-CM | POA: Diagnosis not present

## 2022-04-11 ENCOUNTER — Other Ambulatory Visit: Payer: Medicare HMO

## 2022-04-11 ENCOUNTER — Ambulatory Visit: Payer: Medicare HMO | Admitting: Hematology and Oncology

## 2022-04-18 ENCOUNTER — Other Ambulatory Visit: Payer: Medicare HMO

## 2022-04-18 ENCOUNTER — Ambulatory Visit: Payer: Medicare HMO | Admitting: Oncology

## 2022-04-18 ENCOUNTER — Telehealth: Payer: Self-pay | Admitting: Oncology

## 2022-04-18 ENCOUNTER — Ambulatory Visit: Payer: Medicare HMO | Admitting: Hematology and Oncology

## 2022-04-18 NOTE — Telephone Encounter (Signed)
04/18/22 Spoke with patient about rescheduling appts.She stated she would call back at later date to reschedule

## 2022-04-18 NOTE — Progress Notes (Deleted)
[facility] Initial Visit:  Patient Care Team: Marga Hoots, NP as PCP - General (Adult Health Nurse Practitioner) Park Liter, MD as PCP - Cardiology (Cardiology) Marvia Pickles, PA-C as Physician Assistant (Hematology and Oncology)  CHIEF COMPLAINTS/PURPOSE OF CONSULTATION:  Oncology History   No history exists.    HISTORY OF PRESENTING ILLNESS: Makayla Fischer 62 y.o. female is here because of anemia  Medical history notable for adnexal mass, arthritis, coronary artery disease, chronic pain, COPD, dyslipidemia, daily use of alcohol, cirrhosis, diabetes mellitus type 2, tobacco use, pulmonary nodule, GERD, varicose veins of legs  March 26, 2021 WBC 3.9 hemoglobin 10.4 MCV 70 Ferritin 3 B12 3617 CMP notable for sodium 135 chloride 98 CO2 32 AST 38 March 30, 2021: CT chest without contrast showed stable 2 mm right upper lobe nodule.  Additional 2 to 3 mm right upper lobe nodules stable.  2 mm left upper lobe pulmonary nodule.  Mild splenomegaly  April 03, 2021: Feraheme 510 mg April 10 2021: Feraheme 510 mg  December 11, 2021: WBC 6.0 hemoglobin 13.9 MCV 94 platelet count 88; 56 segs 33 lymphs 6 monos 4 eos 1 basophil.  Ferritin 27  February 19, 2022: EGD-GE junction was well-distended.  Distal esophagus not inflamed.  Stomach normal mucosa.  Duodenal hernia present. 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 Posey Rea, stress test in 01/2011   Hypomagnesemia 08/10/2017   Hyponatremia 08/10/2017   Incisional hernia    with obstruction   Myocardial infarction ([place]) 02/2008   Neuromuscular disorder ([place])    neuropathy   Osteoporosis    Pulmonary nodule less than 6 mm in diameter with high risk for malignant neoplasm 11/23/2019   Shortness of breath    Status post insertion of drug eluting coronary artery stent 09/15/2018   Substance abuse ([place])    drugs and alcohol   Tobacco abuse 08/10/2017   Type II diabetes mellitus, well controlled ([place]) 0000000   Uncomplicated opioid dependence ([place]) 08/10/2017   Unstable angina ([place]) 09/15/2018   Varicose veins of leg with complications Q000111Q   Varicose veins of lower extremities with other complications [id number]   Varicose veins of lower extremity 10/12/2013    SURGICAL HISTORY: Past Surgical History:  Procedure Laterality Date   CARDIAC CATHETERIZATION     02/2008- Gaspar Cola, x2 stents    CARDIAC SURGERY     CHOLECYSTECTOMY     CORONARY STENT INTERVENTION N/A 08/16/2020   Procedure: CORONARY STENT INTERVENTION;  Surgeon: Wellington Hampshire, MD;  Location: [facility];  Service: Cardiovascular;  Laterality: N/A;   INTRAVASCULAR IMAGING/OCT N/A 08/16/2020   Procedure: INTRAVASCULAR IMAGING/OCT;  Surgeon: Wellington Hampshire, MD;  Location: [facility];  Service: Cardiovascular;  Laterality: N/A;   INTRAVASCULAR PRESSURE WIRE/FFR STUDY N/A 08/16/2020   Procedure: INTRAVASCULAR PRESSURE WIRE/FFR STUDY;  Surgeon: Wellington Hampshire, MD;  Location: [facility];  Service: Cardiovascular;   Laterality: N/A;   LEFT HEART CATH AND CORONARY ANGIOGRAPHY N/A 08/16/2020   Procedure: LEFT HEART CATH AND CORONARY ANGIOGRAPHY;  Surgeon: Wellington Hampshire, MD;  Location: [facility];  Service: Cardiovascular;  Laterality: N/A;   MANDIBLE FRACTURE SURGERY     1980   TONSILLECTOMY AND ADENOIDECTOMY     TUBAL LIGATION     VENTRAL HERNIA REPAIR  07/12/2011   Procedure: LAPAROSCOPIC VENTRAL HERNIA;  Surgeon: Adin Hector, MD;  Location: [facility];  Service: General;  Laterality: N/A;  laparoscopic repair incisional hernia with mesh    SOCIAL HISTORY: Social History   Socioeconomic History   Marital status: Married    Spouse name: Not on file   Number of children: 2   Years of education: 12   Highest education level: High school graduate  Occupational History   Occupation: disabled  Tobacco Use   Smoking status: Every Day    Packs/day: 1.00    Years: 40.00    Additional pack years: 0.00    Total pack years: 40.00    Types: Cigarettes, E-cigarettes    Last attempt to quit: 02/03/2022    Years since quitting: 0.2   Smokeless tobacco: Never  Vaping Use   Vaping Use: Never used  Substance and Sexual Activity   Alcohol use: Not Currently    Alcohol/week: 21.0 standard drinks of alcohol    Types: 21 Standard drinks or equivalent per week    Comment: 3 mikes hard lemonades a day   Drug use: No    Types: Heroin    Comment: 1.5 yr. use of Heroin- 1990's    Sexual activity: Not Currently  Other Topics Concern   Not on file  Social History Narrative   Not on file   Social Determinants of Health   Financial Resource Strain: Not on file  Food Insecurity: Not on file  Transportation Needs: No Transportation Needs (09/20/2021)   PRAPARE - Transportation    Lack of Transportation (Medical): No    Lack of Transportation (Non-Medical): No  Physical Activity: Not on file  Stress: Not on file  Social Connections: Not on file  Intimate Partner Violence: Not on file     FAMILY HISTORY Family History  Problem Relation Age of Onset   Diabetes Mother    Hyperlipidemia Mother    Hypertension Mother    Heart disease Mother    Pancreatitis Mother    Cancer Father        lymphoma   COPD Father    Throat cancer Maternal Uncle    Colon cancer Paternal Uncle    Anesthesia problems Neg Hx     ALLERGIES:  is allergic to septra [bactrim] and sulfamethoxazole-trimethoprim.  MEDICATIONS:  Current Outpatient Medications  Medication Sig Dispense Refill   albuterol (VENTOLIN HFA) 108 (90 Base) MCG/ACT inhaler Inhale 2 puffs into the lungs every 6 (six) hours as needed for wheezing or shortness of breath.     amLODipine (NORVASC) 2.5 MG tablet Take 2.5 mg by mouth daily.     Ascorbic Acid (VITAMIN C) 500  MG CAPS Take 1 capsule by mouth daily.     aspirin 81 MG tablet Take 81 mg by mouth daily.     atorvastatin (LIPITOR) 80 MG tablet Take 1 tablet (80 mg total) by mouth daily. 90 tablet 2   Buprenorphine HCl-Naloxone HCl 8-2 MG FILM Place 1 Film under the tongue 2 (two) times daily.     carvedilol (COREG) 6.25 MG tablet Take 6.25 mg by mouth 2 (two) times daily with a meal.     clonazePAM (KLONOPIN) 1 MG tablet Take 1 mg by mouth 2 (two) times daily.     clopidogrel (PLAVIX) 75 MG tablet Take 1 tablet (75 mg total) by mouth daily. 90 tablet 2   cyanocobalamin 1000 MCG tablet Take 4,000 mcg by mouth daily.     cyclobenzaprine (FLEXERIL) 10 MG tablet Take 10 mg by mouth 3 (three) times daily as needed for muscle spasms. For muscle pain     ezetimibe (ZETIA) 10 MG tablet Take 1 tablet (10 mg total) by mouth daily. 90 tablet 3   folic acid (FOLVITE) A999333 MCG tablet Take 400 mcg by mouth daily.     gabapentin (NEURONTIN) 600 MG tablet Take 600 mg by mouth 4 (four) times daily.     isosorbide mononitrate (IMDUR) 30 MG 24 hr tablet Take 1 tablet by mouth daily.     lisinopril (ZESTRIL) 20 MG tablet Take 20 mg by mouth 2 (two) times daily.     magnesium oxide (MAG-OX)  400 MG tablet Take 1 tablet by mouth 2 (two) times daily.     metFORMIN (GLUCOPHAGE) 500 MG tablet Take 500 mg by mouth 2 (two) times daily.     naloxone (NARCAN) nasal spray 4 mg/0.1 mL Place 1 spray into the nose once.     nicotine (NICODERM CQ - DOSED IN MG/24 HOURS) 14 mg/24hr patch Place 1 patch (14 mg total) onto the skin daily. 14 patch 0   nicotine (NICODERM CQ - DOSED IN MG/24 HOURS) 21 mg/24hr patch Place 1 patch (21 mg total) onto the skin daily. 14 patch 0   nicotine (NICODERM CQ - DOSED IN MG/24 HR) 7 mg/24hr patch Place 1 patch (7 mg total) onto the skin daily. 14 patch 0   nitroGLYCERIN (NITROSTAT) 0.4 MG SL tablet Place 1 tablet (0.4 mg total) under the tongue every 5 (five) minutes as needed for chest pain. 25 tablet 5   ondansetron (ZOFRAN) 4 MG tablet Take 1 tablet (4 mg total) by mouth every 8 (eight) hours as needed for nausea or vomiting. 30 tablet 0   pantoprazole (PROTONIX) 40 MG tablet Take 40 mg by mouth daily.     potassium gluconate 595 (99 K) MG TABS tablet Take 595 mg by mouth 2 (two) times daily.     ranolazine (RANEXA) 500 MG 12 hr tablet Take 1 tablet (500 mg total) by mouth 2 (two) times daily. 180 tablet 3   No current facility-administered medications for this visit.    PHYSICAL EXAMINATION:  ECOG PERFORMANCE STATUS: {CHL ONC ECOG [phone]}   There were no vitals filed for this visit.  There were no vitals filed for this visit.   Physical Exam   LABORATORY DATA: I have personally reviewed the data as listed:  No visits with results within 1 Month(s) from this visit.  Latest known visit with results is:  Appointment on 12/11/2021  Component Date Value Ref Range Status   Iron 12/11/2021 102  28 - 170 ug/dL Final  TIBC 12/11/2021 374  250 - 450 ug/dL Final   Saturation Ratios 12/11/2021 27  10.4 - 31.8 % Final   UIBC 12/11/2021 272  ug/dL Final   Performed at [facility], [place] [address] [place], [place] [address]    Ferritin 12/11/2021 13  11 - 307 ng/mL Final   Performed at [facility], [place] [address] [place], [place] [address]   WBC Count 12/11/2021 6.0  4.0 - 10.5 K/uL Final   RBC 12/11/2021 4.44  3.87 - 5.11 MIL/uL Final   Hemoglobin 12/11/2021 13.9  12.0 - 15.0 g/dL Final   HCT 12/11/2021 41.9  36.0 - 46.0 % Final   MCV 12/11/2021 94.4  80.0 - 100.0 fL Final   MCH 12/11/2021 31.3  26.0 - 34.0 pg Final   MCHC 12/11/2021 33.2  30.0 - 36.0 g/dL Final   RDW 12/11/2021 12.5  11.5 - 15.5 % Final   Platelet Count 12/11/2021 198  150 - 400 K/uL Final   nRBC 12/11/2021 0.0  0.0 - 0.2 % Final   Neutrophils Relative % 12/11/2021 56  % Final   Neutro Abs 12/11/2021 3.3  1.7 - 7.7 K/uL Final   Lymphocytes Relative 12/11/2021 33  % Final   Lymphs Abs 12/11/2021 2.0  0.7 - 4.0 K/uL Final   Monocytes Relative 12/11/2021 6  % Final   Monocytes Absolute 12/11/2021 0.4  0.1 - 1.0 K/uL Final   Eosinophils Relative 12/11/2021 4  % Final   Eosinophils Absolute 12/11/2021 0.3  0.0 - 0.5 K/uL Final   Basophils Relative 12/11/2021 1  % Final   Basophils Absolute 12/11/2021 0.0  0.0 - 0.1 K/uL Final   Immature Granulocytes 12/11/2021 0  % Final   Abs Immature Granulocytes 12/11/2021 0.02  0.00 - 0.07 K/uL Final   Performed at [facility], [place] [address], [place], [place] [address]    RADIOGRAPHIC STUDIES: I have personally reviewed the radiological images as listed and agree with the findings in the report  No results found.  ASSESSMENT/PLAN Cancer Staging  No matching staging information was found for the patient.   No problem-specific Assessment & Plan notes found for this encounter.    No orders of the defined types were placed in this encounter.     minutes was spent in patient care.  This included time spent preparing to see the patient (e.g., review of tests), obtaining and/or reviewing separately obtained history, counseling and educating the  patient/family/caregiver, ordering medications, tests, or procedures; documenting clinical information in the electronic or other health record, independently interpreting results and communicating results to the patient/family/caregiver as well as coordination of care.       All questions were answered. The patient knows to call the clinic with any problems, questions or concerns.  This note was electronically signed.    Barbee Cough, MD  04/18/2022 8:47 AM

## 2022-04-30 DIAGNOSIS — F1120 Opioid dependence, uncomplicated: Secondary | ICD-10-CM | POA: Diagnosis not present

## 2022-04-30 DIAGNOSIS — F411 Generalized anxiety disorder: Secondary | ICD-10-CM | POA: Diagnosis not present

## 2022-04-30 DIAGNOSIS — F331 Major depressive disorder, recurrent, moderate: Secondary | ICD-10-CM | POA: Diagnosis not present

## 2022-05-02 DIAGNOSIS — M25552 Pain in left hip: Secondary | ICD-10-CM | POA: Diagnosis not present

## 2022-05-02 DIAGNOSIS — S52122A Displaced fracture of head of left radius, initial encounter for closed fracture: Secondary | ICD-10-CM | POA: Diagnosis not present

## 2022-05-02 DIAGNOSIS — M25522 Pain in left elbow: Secondary | ICD-10-CM | POA: Diagnosis not present

## 2022-05-02 DIAGNOSIS — R600 Localized edema: Secondary | ICD-10-CM | POA: Diagnosis not present

## 2022-05-02 DIAGNOSIS — M25512 Pain in left shoulder: Secondary | ICD-10-CM | POA: Diagnosis not present

## 2022-05-02 DIAGNOSIS — Z6824 Body mass index (BMI) 24.0-24.9, adult: Secondary | ICD-10-CM | POA: Diagnosis not present

## 2022-05-07 DIAGNOSIS — S52122A Displaced fracture of head of left radius, initial encounter for closed fracture: Secondary | ICD-10-CM | POA: Diagnosis not present

## 2022-05-14 DIAGNOSIS — F331 Major depressive disorder, recurrent, moderate: Secondary | ICD-10-CM | POA: Diagnosis not present

## 2022-05-14 DIAGNOSIS — F411 Generalized anxiety disorder: Secondary | ICD-10-CM | POA: Diagnosis not present

## 2022-05-14 DIAGNOSIS — F1120 Opioid dependence, uncomplicated: Secondary | ICD-10-CM | POA: Diagnosis not present

## 2022-05-14 DIAGNOSIS — Z6824 Body mass index (BMI) 24.0-24.9, adult: Secondary | ICD-10-CM | POA: Diagnosis not present

## 2022-05-28 DIAGNOSIS — F331 Major depressive disorder, recurrent, moderate: Secondary | ICD-10-CM | POA: Diagnosis not present

## 2022-05-28 DIAGNOSIS — F411 Generalized anxiety disorder: Secondary | ICD-10-CM | POA: Diagnosis not present

## 2022-05-28 DIAGNOSIS — F1120 Opioid dependence, uncomplicated: Secondary | ICD-10-CM | POA: Diagnosis not present

## 2022-06-11 DIAGNOSIS — F331 Major depressive disorder, recurrent, moderate: Secondary | ICD-10-CM | POA: Diagnosis not present

## 2022-06-11 DIAGNOSIS — F411 Generalized anxiety disorder: Secondary | ICD-10-CM | POA: Diagnosis not present

## 2022-06-11 DIAGNOSIS — Z6824 Body mass index (BMI) 24.0-24.9, adult: Secondary | ICD-10-CM | POA: Diagnosis not present

## 2022-06-11 DIAGNOSIS — F1120 Opioid dependence, uncomplicated: Secondary | ICD-10-CM | POA: Diagnosis not present

## 2022-06-26 DIAGNOSIS — F1120 Opioid dependence, uncomplicated: Secondary | ICD-10-CM | POA: Diagnosis not present

## 2022-06-26 DIAGNOSIS — F331 Major depressive disorder, recurrent, moderate: Secondary | ICD-10-CM | POA: Diagnosis not present

## 2022-06-26 DIAGNOSIS — F411 Generalized anxiety disorder: Secondary | ICD-10-CM | POA: Diagnosis not present

## 2022-06-28 DIAGNOSIS — F411 Generalized anxiety disorder: Secondary | ICD-10-CM | POA: Diagnosis not present

## 2022-06-28 DIAGNOSIS — M549 Dorsalgia, unspecified: Secondary | ICD-10-CM | POA: Diagnosis not present

## 2022-07-17 DIAGNOSIS — F1120 Opioid dependence, uncomplicated: Secondary | ICD-10-CM | POA: Diagnosis not present

## 2022-07-17 DIAGNOSIS — F331 Major depressive disorder, recurrent, moderate: Secondary | ICD-10-CM | POA: Diagnosis not present

## 2022-07-17 DIAGNOSIS — F411 Generalized anxiety disorder: Secondary | ICD-10-CM | POA: Diagnosis not present

## 2022-07-17 DIAGNOSIS — Z6824 Body mass index (BMI) 24.0-24.9, adult: Secondary | ICD-10-CM | POA: Diagnosis not present

## 2022-07-31 ENCOUNTER — Other Ambulatory Visit: Payer: Self-pay | Admitting: Cardiology

## 2022-07-31 DIAGNOSIS — E782 Mixed hyperlipidemia: Secondary | ICD-10-CM

## 2022-07-31 DIAGNOSIS — I25118 Atherosclerotic heart disease of native coronary artery with other forms of angina pectoris: Secondary | ICD-10-CM

## 2022-08-06 DIAGNOSIS — H524 Presbyopia: Secondary | ICD-10-CM | POA: Diagnosis not present

## 2022-08-06 DIAGNOSIS — E119 Type 2 diabetes mellitus without complications: Secondary | ICD-10-CM | POA: Diagnosis not present

## 2022-08-06 DIAGNOSIS — Z7984 Long term (current) use of oral hypoglycemic drugs: Secondary | ICD-10-CM | POA: Diagnosis not present

## 2022-08-06 DIAGNOSIS — Z961 Presence of intraocular lens: Secondary | ICD-10-CM | POA: Diagnosis not present

## 2022-08-07 DIAGNOSIS — F331 Major depressive disorder, recurrent, moderate: Secondary | ICD-10-CM | POA: Diagnosis not present

## 2022-08-07 DIAGNOSIS — F411 Generalized anxiety disorder: Secondary | ICD-10-CM | POA: Diagnosis not present

## 2022-08-07 DIAGNOSIS — F1120 Opioid dependence, uncomplicated: Secondary | ICD-10-CM | POA: Diagnosis not present

## 2022-08-29 DIAGNOSIS — F1120 Opioid dependence, uncomplicated: Secondary | ICD-10-CM | POA: Diagnosis not present

## 2022-08-29 DIAGNOSIS — F411 Generalized anxiety disorder: Secondary | ICD-10-CM | POA: Diagnosis not present

## 2022-08-29 DIAGNOSIS — Z6824 Body mass index (BMI) 24.0-24.9, adult: Secondary | ICD-10-CM | POA: Diagnosis not present

## 2022-08-29 DIAGNOSIS — F331 Major depressive disorder, recurrent, moderate: Secondary | ICD-10-CM | POA: Diagnosis not present

## 2022-09-19 DIAGNOSIS — F1120 Opioid dependence, uncomplicated: Secondary | ICD-10-CM | POA: Diagnosis not present

## 2022-09-19 DIAGNOSIS — F411 Generalized anxiety disorder: Secondary | ICD-10-CM | POA: Diagnosis not present

## 2022-09-19 DIAGNOSIS — F331 Major depressive disorder, recurrent, moderate: Secondary | ICD-10-CM | POA: Diagnosis not present

## 2022-09-26 ENCOUNTER — Encounter: Payer: Self-pay | Admitting: Cardiology

## 2022-09-26 ENCOUNTER — Ambulatory Visit: Payer: Medicare HMO | Attending: Cardiology | Admitting: Cardiology

## 2022-09-26 VITALS — BP 130/72 | HR 69 | Ht 64.6 in | Wt 153.0 lb

## 2022-09-26 DIAGNOSIS — Z7984 Long term (current) use of oral hypoglycemic drugs: Secondary | ICD-10-CM | POA: Diagnosis not present

## 2022-09-26 DIAGNOSIS — E782 Mixed hyperlipidemia: Secondary | ICD-10-CM

## 2022-09-26 DIAGNOSIS — E119 Type 2 diabetes mellitus without complications: Secondary | ICD-10-CM | POA: Diagnosis not present

## 2022-09-26 DIAGNOSIS — Z720 Tobacco use: Secondary | ICD-10-CM | POA: Diagnosis not present

## 2022-09-26 DIAGNOSIS — I2510 Atherosclerotic heart disease of native coronary artery without angina pectoris: Secondary | ICD-10-CM

## 2022-09-26 DIAGNOSIS — I10 Essential (primary) hypertension: Secondary | ICD-10-CM

## 2022-09-26 DIAGNOSIS — E785 Hyperlipidemia, unspecified: Secondary | ICD-10-CM | POA: Diagnosis not present

## 2022-09-26 NOTE — Patient Instructions (Signed)
Medication Instructions:  Your physician recommends that you continue on your current medications as directed. Please refer to the Current Medication list given to you today.  *If you need a refill on your cardiac medications before your next appointment, please call your pharmacy*   Lab Work: NONE If you have labs (blood work) drawn today and your tests are completely normal, you will receive your results only by: MyChart Message (if you have MyChart) OR A paper copy in the mail If you have any lab test that is abnormal or we need to change your treatment, we will call you to review the results.   Testing/Procedures: NONE   Follow-Up: At [facility], you and your health needs are our priority.  As part of our continuing mission to provide you with exceptional heart care, we have created designated Provider Care Teams.  These Care Teams include your primary Cardiologist (physician) and Advanced Practice Providers (APPs -  Physician Assistants and Nurse Practitioners) who all work together to provide you with the care you need, when you need it.  We recommend signing up for the patient portal called "MyChart".  Sign up information is provided on this After Visit Summary.  MyChart is used to connect with patients for Virtual Visits (Telemedicine).  Patients are able to view lab/test results, encounter notes, upcoming appointments, etc.  Non-urgent messages can be sent to your provider as well.   To learn more about what you can do with MyChart, go to [email or web address].    Your next appointment:   6 month(s)  Provider:   Robert Krasowski, MD    Other Instructions   

## 2022-09-26 NOTE — Progress Notes (Signed)
Cardiology Office Note:  .   Date:  09/26/2022  ID:  Makayla Fischer, DOB 31-Aug-1960, MRN [id number] PCP: Doran Stabler, NP  [facility] Providers Cardiologist:  Gypsy Balsam, MD    History of Present Illness: .   Makayla Fischer is a 62 y.o. female with a past medical history of hypertension, CAD s/p DES to proximal LAD most recently in 2022, GERD, DM 2, tobacco abuse, iron deficiency anemia, dyslipidemia, fibromyalgia.  08/16/2020 left heart cath significant underlying three-vessel CAD, patent stent in the LAD and left circumflex, DES placed to the proximal LAD overlapping the previous stent.  Recommendations for DAPT indefinitely. 04/01/2019 Lexiscan normal, low restudy 09/15/2018 echocardiogram EF 55%, mitral annular calcification  Most recently evaluated by Dr. Bing Matter on 03/12/2022, she did recently stop smoking but was using vapes and trying to quit this as well.  From a cardiac perspective she was doing well, offers no complaints.  She presents today for follow-up of her CAD, has been doing well since she was last evaluated in our office.  She has had a few episodes of chest pain that has been relieved and nitroglycerin-the 1 incident she recalls she was push mowing her neighbors yard and 80+ degree weather, developed chest pain as well as a flushing sensation, took a nitroglycerin and cooled off and symptoms subsided.  Overall she stays relatively active and has not had any angina with her daily activities.  She does endorse a high level of stress at home, considering separating from her significant other. She denies palpitations, dyspnea, pnd, orthopnea, n, v, dizziness, syncope, edema, weight gain, or early satiety.    ROS: Review of Systems  Cardiovascular:  Positive for chest pain.  Endo/Heme/Allergies:  Bruises/bleeds easily.  All other systems reviewed and are negative.    Studies Reviewed: .        Cardiac Studies & Procedures   CARDIAC  CATHETERIZATION  CARDIAC CATHETERIZATION 08/16/2020  Narrative   Ost Cx to Mid Cx lesion is 20% stenosed.   Dist LAD lesion is 70% stenosed.   LPAV lesion is 30% stenosed.   Prox LAD lesion is 75% stenosed.   Prox RCA to Mid RCA lesion is 60% stenosed.   Prox LAD to Mid LAD lesion is 30% stenosed.   A drug-eluting stent was successfully placed using a STENT ONYX FRONTIER 3.0X18.   Post intervention, there is a 0% residual stenosis.   The left ventricular systolic function is normal.   LV end diastolic pressure is normal.   The left ventricular ejection fraction is 55-65% by visual estimate.  1.  Significant underlying three-vessel coronary artery disease with patent stents in the LAD and left circumflex.  There is 75% stenosis proximal to the LAD stent that was significant by pressure wire evaluation.  In addition, there is a 60% stenosis in the mid RCA.  The coronary arteries are codominant. 2.  Normal LV systolic function normal left ventricular end-diastolic pressure 3.  Successful OCT guided angioplasty and drug-eluting stent placement to the proximal LAD overlapping the previous stent.  Recommendations: Continue dual antiplatelet therapy indefinitely. Aggressive treatment of risk factors. Treat the right coronary artery medically for now. Possible discharge home tomorrow.  Findings Coronary Findings Diagnostic  Dominance: Co-dominant  Left Main Vessel is angiographically normal.  Left Anterior Descending Prox LAD lesion is 75% stenosed. The lesion is type C and located at the major branch. The lesion is mildly calcified. The lesion was not previously treated . Pressure wire/FFR  was performed on the lesion. Optical coherence tomography (OCT) was performed. A pressure wire was used in the standard fashion.  RFR ratio was 0.85. OCT was performed which showed that the stent diameter was likely 3 mm.  Proximal lesion diameter was 3.4 mm.  The lesion was mildly calcified.  No  significant ostial diagonal disease was noted on OCT. Prox LAD to Mid LAD lesion is 30% stenosed. The lesion was previously treated . Dist LAD lesion is 70% stenosed.  First Diagonal Branch Vessel is large in size. There is mild disease in the vessel.  Second Diagonal Branch Vessel is angiographically normal.  Third Diagonal Branch Vessel is angiographically normal.  Left Circumflex Ost Cx to Mid Cx lesion is 20% stenosed. The lesion was previously treated .  Left Posterior Atrioventricular Artery LPAV lesion is 30% stenosed.  Right Coronary Artery Prox RCA to Mid RCA lesion is 60% stenosed.  Intervention  Prox LAD lesion Stent Lesion length:  16 mm. CATH LAUNCHER 6FR EBU3.5 guide catheter was inserted. Lesion crossed with guidewire using a GUIDEWIRE PRESSURE X 175. Pre-stent angioplasty was performed using a BALLOON SAPPHIRE 2.5X12. Maximum pressure:  8 atm. Inflation time:  30 sec. A drug-eluting stent was successfully placed using a STENT ONYX FRONTIER 3.0X18. Maximum pressure: 14 atm. Inflation time: 20 sec. Stent overlaps previously placed stent. Post-stent angioplasty was performed using a BALLOON SAPPHIRE [place] 3.5X12. Maximum pressure:  14 atm. Inflation time:  20 sec. Runthrough wire was used to advance to the diagonal branch in case rescue PCI was needed.  This wire was removed before deploying the stent. Post-Intervention Lesion Assessment The intervention was successful. Pre-interventional TIMI flow is 3. Post-intervention TIMI flow is 3. No complications occurred at this lesion. There is a 0% residual stenosis post intervention.   STRESS TESTS  MYOCARDIAL PERFUSION IMAGING 04/01/2019  Narrative  The left ventricular ejection fraction is hyperdynamic (>65%).  Nuclear stress EF: 66%.  There was no ST segment deviation noted during stress.  The study is normal.  This is a low risk study.              Risk Assessment/Calculations:             Physical Exam:    VS:  BP 130/72 (BP Location: Left Arm, Patient Position: Sitting, Cuff Size: Normal)   Pulse 69   Ht 5' 4.6" (1.641 m)   Wt 153 lb (69.4 kg)   SpO2 94%   BMI 25.78 kg/m    Wt Readings from Last 3 Encounters:  09/26/22 153 lb (69.4 kg)  03/12/22 149 lb 6.4 oz (67.8 kg)  12/11/21 143 lb 8 oz (65.1 kg)    GEN: Well nourished, well developed in no acute distress NECK: No JVD; No carotid bruits CARDIAC: RRR, no murmurs, rubs, gallops RESPIRATORY:  Clear to auscultation without rales, wheezing or rhonchi  ABDOMEN: Soft, non-tender, non-distended EXTREMITIES:  No edema; No deformity   ASSESSMENT AND PLAN: .   Coronary artery disease- left heart cath in 2022 was significant underlying three-vessel CAD, patent stent in the LAD and left circumflex, DES placed to the proximal LAD overlapping the previous stent.  Recommendations for DAPT indefinitely with aspirin 81 mg daily, Plavix 75 mg daily.  Continue nitroglycerin as needed, continue Imdur 30 mg daily, continue Ranexa 500 mg twice daily, continue Coreg 6.25 mg twice daily. Stable angina per outlined above in the HPI, no indication for repeat ischemic evaluation at this time. Hypertension-blood pressure is controlled at 130/72,  continue lisinopril 20 mg daily. Dyslipidemia-most recent LDL is well-controlled at 62, continue Zetia 10 mg daily, continue Lipitor 80 mg daily. Tobacco abuse-complete cessation x 3 months, she is still vaping however trying to quit that as well. DM2-is managed by her primary care physician, she is trying to lose weight and adhere to healthy lifestyle.Heart healthy diet and regular cardiovascular exercise encouraged.        Dispo: Follow-up in 6 months.  Signed, Flossie Dibble, NP

## 2022-10-10 DIAGNOSIS — F411 Generalized anxiety disorder: Secondary | ICD-10-CM | POA: Diagnosis not present

## 2022-10-10 DIAGNOSIS — F331 Major depressive disorder, recurrent, moderate: Secondary | ICD-10-CM | POA: Diagnosis not present

## 2022-10-10 DIAGNOSIS — F1120 Opioid dependence, uncomplicated: Secondary | ICD-10-CM | POA: Diagnosis not present

## 2022-10-10 DIAGNOSIS — Z6824 Body mass index (BMI) 24.0-24.9, adult: Secondary | ICD-10-CM | POA: Diagnosis not present

## 2022-10-12 ENCOUNTER — Other Ambulatory Visit: Payer: Self-pay | Admitting: Cardiology

## 2022-10-29 DIAGNOSIS — D509 Iron deficiency anemia, unspecified: Secondary | ICD-10-CM | POA: Diagnosis not present

## 2022-10-29 DIAGNOSIS — E782 Mixed hyperlipidemia: Secondary | ICD-10-CM | POA: Diagnosis not present

## 2022-10-29 DIAGNOSIS — K219 Gastro-esophageal reflux disease without esophagitis: Secondary | ICD-10-CM | POA: Diagnosis not present

## 2022-10-29 DIAGNOSIS — I2510 Atherosclerotic heart disease of native coronary artery without angina pectoris: Secondary | ICD-10-CM | POA: Diagnosis not present

## 2022-10-29 DIAGNOSIS — E11610 Type 2 diabetes mellitus with diabetic neuropathic arthropathy: Secondary | ICD-10-CM | POA: Diagnosis not present

## 2022-10-29 DIAGNOSIS — Z6824 Body mass index (BMI) 24.0-24.9, adult: Secondary | ICD-10-CM | POA: Diagnosis not present

## 2022-10-29 DIAGNOSIS — F411 Generalized anxiety disorder: Secondary | ICD-10-CM | POA: Diagnosis not present

## 2022-10-31 DIAGNOSIS — F1120 Opioid dependence, uncomplicated: Secondary | ICD-10-CM | POA: Diagnosis not present

## 2022-10-31 DIAGNOSIS — F331 Major depressive disorder, recurrent, moderate: Secondary | ICD-10-CM | POA: Diagnosis not present

## 2022-10-31 DIAGNOSIS — F411 Generalized anxiety disorder: Secondary | ICD-10-CM | POA: Diagnosis not present

## 2022-11-20 DIAGNOSIS — Z0000 Encounter for general adult medical examination without abnormal findings: Secondary | ICD-10-CM | POA: Diagnosis not present

## 2022-11-20 DIAGNOSIS — Z1331 Encounter for screening for depression: Secondary | ICD-10-CM | POA: Diagnosis not present

## 2022-11-20 DIAGNOSIS — Z9181 History of falling: Secondary | ICD-10-CM | POA: Diagnosis not present

## 2022-11-21 DIAGNOSIS — F411 Generalized anxiety disorder: Secondary | ICD-10-CM | POA: Diagnosis not present

## 2022-11-21 DIAGNOSIS — F1120 Opioid dependence, uncomplicated: Secondary | ICD-10-CM | POA: Diagnosis not present

## 2022-11-21 DIAGNOSIS — Z6824 Body mass index (BMI) 24.0-24.9, adult: Secondary | ICD-10-CM | POA: Diagnosis not present

## 2022-11-21 DIAGNOSIS — F331 Major depressive disorder, recurrent, moderate: Secondary | ICD-10-CM | POA: Diagnosis not present

## 2022-11-26 DIAGNOSIS — G8929 Other chronic pain: Secondary | ICD-10-CM | POA: Diagnosis not present

## 2022-11-26 DIAGNOSIS — R0981 Nasal congestion: Secondary | ICD-10-CM | POA: Diagnosis not present

## 2022-11-26 DIAGNOSIS — M549 Dorsalgia, unspecified: Secondary | ICD-10-CM | POA: Diagnosis not present

## 2022-11-26 DIAGNOSIS — Z6827 Body mass index (BMI) 27.0-27.9, adult: Secondary | ICD-10-CM | POA: Diagnosis not present

## 2022-11-26 DIAGNOSIS — E11610 Type 2 diabetes mellitus with diabetic neuropathic arthropathy: Secondary | ICD-10-CM | POA: Diagnosis not present

## 2022-11-26 DIAGNOSIS — R42 Dizziness and giddiness: Secondary | ICD-10-CM | POA: Diagnosis not present

## 2022-11-26 DIAGNOSIS — J449 Chronic obstructive pulmonary disease, unspecified: Secondary | ICD-10-CM | POA: Diagnosis not present

## 2022-11-28 ENCOUNTER — Encounter: Payer: Self-pay | Admitting: Hematology and Oncology

## 2022-12-19 DIAGNOSIS — F1120 Opioid dependence, uncomplicated: Secondary | ICD-10-CM | POA: Diagnosis not present

## 2022-12-19 DIAGNOSIS — F411 Generalized anxiety disorder: Secondary | ICD-10-CM | POA: Diagnosis not present

## 2022-12-19 DIAGNOSIS — F331 Major depressive disorder, recurrent, moderate: Secondary | ICD-10-CM | POA: Diagnosis not present

## 2022-12-31 DIAGNOSIS — M7061 Trochanteric bursitis, right hip: Secondary | ICD-10-CM | POA: Diagnosis not present

## 2023-01-16 DIAGNOSIS — F331 Major depressive disorder, recurrent, moderate: Secondary | ICD-10-CM | POA: Diagnosis not present

## 2023-01-16 DIAGNOSIS — F1120 Opioid dependence, uncomplicated: Secondary | ICD-10-CM | POA: Diagnosis not present

## 2023-01-16 DIAGNOSIS — F411 Generalized anxiety disorder: Secondary | ICD-10-CM | POA: Diagnosis not present

## 2023-02-12 DIAGNOSIS — Z6824 Body mass index (BMI) 24.0-24.9, adult: Secondary | ICD-10-CM | POA: Diagnosis not present

## 2023-02-12 DIAGNOSIS — Z1231 Encounter for screening mammogram for malignant neoplasm of breast: Secondary | ICD-10-CM | POA: Diagnosis not present

## 2023-02-12 DIAGNOSIS — E11610 Type 2 diabetes mellitus with diabetic neuropathic arthropathy: Secondary | ICD-10-CM | POA: Diagnosis not present

## 2023-02-12 DIAGNOSIS — Z79899 Other long term (current) drug therapy: Secondary | ICD-10-CM | POA: Diagnosis not present

## 2023-02-12 DIAGNOSIS — D509 Iron deficiency anemia, unspecified: Secondary | ICD-10-CM | POA: Diagnosis not present

## 2023-02-12 DIAGNOSIS — E782 Mixed hyperlipidemia: Secondary | ICD-10-CM | POA: Diagnosis not present

## 2023-02-12 DIAGNOSIS — F411 Generalized anxiety disorder: Secondary | ICD-10-CM | POA: Diagnosis not present

## 2023-02-12 DIAGNOSIS — I2510 Atherosclerotic heart disease of native coronary artery without angina pectoris: Secondary | ICD-10-CM | POA: Diagnosis not present

## 2023-02-13 DIAGNOSIS — F411 Generalized anxiety disorder: Secondary | ICD-10-CM | POA: Diagnosis not present

## 2023-02-13 DIAGNOSIS — F331 Major depressive disorder, recurrent, moderate: Secondary | ICD-10-CM | POA: Diagnosis not present

## 2023-02-13 DIAGNOSIS — F1120 Opioid dependence, uncomplicated: Secondary | ICD-10-CM | POA: Diagnosis not present

## 2023-02-20 ENCOUNTER — Ambulatory Visit (HOSPITAL_BASED_OUTPATIENT_CLINIC_OR_DEPARTMENT_OTHER)
Admit: 2023-02-20 | Discharge: 2023-02-20 | Disposition: A | Discharge status: Home / Self Care | Payer: Medicare HMO | Attending: Family Medicine | Admitting: Family Medicine

## 2023-02-20 ENCOUNTER — Encounter (HOSPITAL_BASED_OUTPATIENT_CLINIC_OR_DEPARTMENT_OTHER): Payer: Self-pay

## 2023-02-20 ENCOUNTER — Other Ambulatory Visit: Payer: Self-pay | Admitting: Cardiology

## 2023-02-20 ENCOUNTER — Ambulatory Visit (HOSPITAL_BASED_OUTPATIENT_CLINIC_OR_DEPARTMENT_OTHER)
Admission: EM | Admit: 2023-02-20 | Discharge: 2023-02-20 | Disposition: A | Discharge status: Home / Self Care | Payer: Medicare HMO | Attending: Family Medicine | Admitting: Family Medicine

## 2023-02-20 DIAGNOSIS — S92414A Nondisplaced fracture of proximal phalanx of right great toe, initial encounter for closed fracture: Secondary | ICD-10-CM | POA: Diagnosis not present

## 2023-02-20 DIAGNOSIS — M19071 Primary osteoarthritis, right ankle and foot: Secondary | ICD-10-CM | POA: Diagnosis not present

## 2023-02-20 DIAGNOSIS — M79674 Pain in right toe(s): Secondary | ICD-10-CM | POA: Diagnosis not present

## 2023-02-20 DIAGNOSIS — S92511A Displaced fracture of proximal phalanx of right lesser toe(s), initial encounter for closed fracture: Secondary | ICD-10-CM | POA: Diagnosis not present

## 2023-02-20 MED ORDER — TRAMADOL HCL 50 MG PO TABS: 50.0000 mg | ORAL_TABLET | Freq: Three times a day (TID) | ORAL | 0 refills | Status: DC | PRN

## 2023-02-20 MED ORDER — TRAMADOL HCL 50 MG PO TABS: 50.0000 mg | ORAL_TABLET | Freq: Three times a day (TID) | ORAL | 0 refills | Status: AC | PRN

## 2023-02-20 MED ORDER — KETOROLAC TROMETHAMINE 30 MG/ML IJ SOLN
30.0000 mg | Freq: Once | INTRAMUSCULAR | Status: AC
  Administered 2023-02-20: 30 mg via INTRAMUSCULAR

## 2023-02-20 NOTE — ED Provider Notes (Addendum)
[facility]    CSN: [id number] Arrival date & time: 02/20/23  1753      History   Chief Complaint Chief Complaint  Patient presents with   Toe Injury    HPI Makayla Fischer is a 63 y.o. female.   HPI Here for pain in her right great toe.  10 ago she got tangled up in a blanket that was on a chair on her husband and fell onto her knees.  The knees are not bothering her though they had some abrasions.  She noticed yesterday morning that her right great toe was swollen and painful.  No fever and she is allergic to sulfa. She states she has some neuropathy  Past medical history includes coronary artery disease.  She takes Plavix among other medicines.  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 Charyl Bigger, stress test in 01/2011   Hypomagnesemia 08/10/2017   Hyponatremia 08/10/2017   Incisional hernia    with obstruction   Myocardial infarction (HCC) 02/2008   Neuromuscular disorder (HCC)    neuropathy   Osteoporosis    Pulmonary nodule less than 6 mm in diameter with high risk for malignant neoplasm 11/23/2019   Shortness of breath    Status post insertion of drug eluting coronary artery stent 09/15/2018   Substance abuse (HCC)    drugs and  alcohol   Tobacco abuse 08/10/2017   Type II diabetes mellitus, well controlled (HCC) 08/10/2017   Uncomplicated opioid dependence (HCC) 08/10/2017   Unstable angina (HCC) 09/15/2018   Varicose veins of leg with complications 06/20/2014   Varicose veins of lower extremities with other complications 10/12/2013   Varicose veins of lower extremity 10/12/2013    Patient Active Problem List   Diagnosis Date Noted   Preop cardiovascular exam 10/10/2021   Diabetic polyneuropathy associated with type 2 diabetes mellitus (HCC) 05/02/2021   Iron deficiency anemia 03/26/2021   Substance abuse (HCC)    Shortness of breath    Neuromuscular disorder (HCC)    Hyperlipidemia    GERD (gastroesophageal reflux disease)    Fibromyalgia    Fatty liver    Depression    Chronic pain    CAD (coronary artery disease)    Arthritis    Anxiety    Anginal pain (HCC)    Adnexal mass 09/06/2019   Dyslipidemia 04/28/2019   Status post insertion of drug eluting coronary artery stent 09/15/2018   Unstable angina (HCC) 09/15/2018   Hypertension 09/15/2018   Coronary disease PTCA and stenting with drug-eluting stent to circumflex artery in August 2020, PTCA and stenting of proximal LAD summer 2022 08/10/2017   Daily consumption of alcohol  08/10/2017   Essential hypertension 08/10/2017   Hypomagnesemia 08/10/2017   Hyponatremia 08/10/2017   Tobacco abuse 08/10/2017   Type II diabetes mellitus, well controlled (HCC) 08/10/2017   Uncomplicated opioid dependence (HCC) 08/10/2017   Chronic coronary artery disease 08/10/2017   Varicose veins of leg with complications 06/20/2014   Varicose veins of bilateral lower extremities with other complications 10/12/2013   Varicose veins of lower extremity 10/12/2013   Incisional hernia 06/12/2011   Myocardial infarction [facility]) 02/2008    Past Surgical History:  Procedure Laterality Date   CARDIAC CATHETERIZATION     [facility] st[facility]    CARDIAC SURGERY      CHOLECYSTECTOMY     CORONARY IMAGING/OCT N/A 08/16/2020   Procedure: INTRAVASCULAR IMAGING/OCT;  Surgeon: Iran Ouch, MD;  Location: [facility] INVASIVE CV LAB;  Service: Cardiovascular;  Laterality: N/A;   CORONARY PRESSURE/FFR STUDY N/A 08/16/2020   Procedure: INTRAVASCULAR PRESSURE WIRE/FFR STUDY;  Surgeon: Iran Ouch, MD;  Location: [facility] INVASIVE CV LAB;  Service: Cardiovascular;  Laterality: N/A;   CORONARY STENT INTERVENTION N/A 08/16/2020   Procedure: CORONARY STENT INTERVENTION;  Surgeon: Iran Ouch, MD;  Location: [facility] INVASIVE CV LAB;  Service: Cardiovascular;  Laterality: N/A;   LEFT HEART CATH AND CORONARY ANGIOGRAPHY N/A 08/16/2020   Procedure: LEFT HEART CATH AND CORONARY ANGIOGRAPHY;  Surgeon: Iran Ouch, MD;  Location: [facility] INVASIVE CV LAB;  Service: Cardiovascular;  Laterality: N/A;   MANDIBLE FRACTURE SURGERY     1980   TONSILLECTOMY AND ADENOIDECTOMY     TUBAL LIGATION     VENTRAL HERNIA REPAIR  07/12/2011   Procedure: LAPAROSCOPIC VENTRAL HERNIA;  Surgeon: Ernestene Mention, MD;  Location: [facility] OR;  Service: General;  Laterality: N/A;  laparoscopic repair incisional hernia with mesh    OB History   No obstetric history on file.      Home Medications    Prior to Admission medications   Medication Sig Start Date End Date Taking? Authorizing Provider  albuterol (VENTOLIN HFA) 108 (90 Base) MCG/ACT inhaler Inhale 2 puffs into the lungs every 6 (six) hours as needed for wheezing or shortness of breath.    [provider]  amLODipine (NORVASC) 2.5 MG tablet Take 2.5 mg by mouth daily.    [provider]  aspirin 81 MG tablet Take 81 mg by mouth daily.    [provider]  atorvastatin (LIPITOR) 80 MG tablet TAKE 1 TABLET(80 MG) BY MOUTH DAILY 07/31/22   Georgeanna Lea, MD  Buprenorphine HCl-Naloxone HCl 8-2 MG FILM Place 1 Film under the tongue 2 (two) times daily.    [provider]  carvedilol (COREG) 6.25 MG tablet  Take 6.25 mg by mouth 2 (two) times daily with a meal.    [provider]  clonazePAM (KLONOPIN) 1 MG tablet Take 1 mg by mouth 2 (two) times daily. 10/14/21   [provider]  clopidogrel (PLAVIX) 75 MG tablet TAKE 1 TABLET(75 MG) BY MOUTH DAILY 07/31/22   Georgeanna Lea, MD  cyclobenzaprine (FLEXERIL) 10 MG tablet Take 10 mg by mouth 3 (three) times daily as needed for muscle spasms. For muscle pain    [provider]  ezetimibe (ZETIA) 10 MG tablet Take 1 tablet (10 mg total) by mouth daily. 02/20/23   Georgeanna Lea, MD  gabapentin (NEURONTIN) 600 MG tablet Take 600 mg by mouth 4 (four) times daily. 03/22/19   [provider]  isosorbide mononitrate (IMDUR) 30 MG 24  hr tablet Take 1 tablet by mouth daily.    [provider]  lisinopril (ZESTRIL) 20 MG tablet Take 20 mg by mouth 2 (two) times daily.    [provider]  magnesium oxide (MAG-OX) 400 MG tablet Take 1 tablet by mouth 2 (two) times daily. 08/12/17   [provider]  metFORMIN (GLUCOPHAGE) 500 MG tablet Take 500 mg by mouth 2 (two) times daily. 03/16/21   [provider]  naloxone [facility]) nasal spray 4 mg/0.1 mL Place 1 spray into the nose once. 01/03/21   [provider]  nicotine (NICODERM CQ - DOSED IN MG/24 HOURS) 14 mg/24hr patch Place 1 patch (14 mg total) onto the skin daily. 08/23/20   Georgeanna Lea, MD  nicotine (NICODERM CQ - DOSED IN MG/24 HOURS) 21 mg/24hr patch Place 1 patch (21 mg total) onto the skin daily. 08/23/20   Georgeanna Lea, MD  nicotine (NICODERM CQ - DOSED IN MG/24 HR) 7 mg/24hr patch Place 1 patch (7 mg total) onto the skin daily. 08/23/20   Georgeanna Lea, MD  nitroGLYCERIN (NITROSTAT) 0.4 MG SL tablet PLACE ONE TABLET UNDER TONGUE AS NEEDED FOR CHEST PAIN EVERY 5 MINUTES FOR UP TO 3 DOSES 10/14/22   Flossie Dibble, NP  ondansetron (ZOFRAN) 4 MG tablet Take 1 tablet (4 mg total) by mouth every 8 (eight) hours as  needed for nausea or vomiting. 12/11/21   Mosher, Harvin Hazel A, PA-C  pantoprazole (PROTONIX) 40 MG tablet Take 40 mg by mouth daily.    [provider]  potassium gluconate 595 (99 K) MG TABS tablet Take 595 mg by mouth 2 (two) times daily.    [provider]  ranolazine (RANEXA) 500 MG 12 hr tablet Take 1 tablet (500 mg total) by mouth 2 (two) times daily. 09/12/20   Georgeanna Lea, MD  traMADol (ULTRAM) 50 MG tablet Take 1 tablet (50 mg total) by mouth every 8 (eight) hours as needed (pain). 02/20/23   Zenia Resides, MD    Family History Family History  Problem Relation Age of Onset   Diabetes Mother    Hyperlipidemia Mother    Hypertension Mother    Heart disease Mother    Pancreatitis Mother    Cancer Father        lymphoma   COPD Father    Throat cancer Maternal Uncle    Colon cancer Paternal Uncle    Anesthesia problems Neg Hx     Social History Social History   Tobacco Use   Smoking status: Every Day    Current packs/day: 0.00    Average packs/day: 1 pack/day for 40.0 years (40.0 ttl pk-yrs)    Types: Cigarettes, E-cigarettes    Start date: 02/03/1982    Last attempt to quit: 02/03/2022    Years since quitting: 1.0   Smokeless tobacco: Never  Vaping Use   Vaping status: Never Used  Substance Use Topics   Alcohol use: Not Currently    Alcohol/week: 21.0 standard drinks of alcohol    Types: 21 Standard drinks or equivalent per week    Comment: 3 mikes hard lemonades a day   Drug use: No    Types: Heroin    Comment: 1.5 yr. use of Heroin- 1990's      Allergies   Septra [bactrim] and Sulfamethoxazole-trimethoprim   Review of Systems Review of Systems   Physical Exam Triage Vital Signs ED Triage Vitals  Encounter Vitals Group     BP  02/20/23 1814 133/75     Systolic BP Percentile --      Diastolic BP Percentile --      Pulse Rate 02/20/23 1814 (!) 59     Resp 02/20/23 1814 20     Temp 02/20/23 1814 98.2 F (36.8 C)     Temp Source  02/20/23 1814 Oral     SpO2 02/20/23 1814 95 %     Weight 02/20/23 1816 155 lb (70.3 kg)     Height --      Head Circumference --      Peak Flow --      Pain Score 02/20/23 1815 6     Pain Loc --      Pain Education --      Exclude from Growth Chart --    No data found.  Updated Vital Signs BP 133/75 (BP Location: Right Arm)   Pulse (!) 59   Temp 98.2 F (36.8 C) (Oral)   Resp 20   Wt 70.3 kg   SpO2 95%   BMI 26.11 kg/m   Visual Acuity Right Eye Distance:   Left Eye Distance:   Bilateral Distance:    Right Eye Near:   Left Eye Near:    Bilateral Near:     Physical Exam Vitals reviewed.  Constitutional:      General: She is not in acute distress.    Appearance: She is not toxic-appearing.  Musculoskeletal:     Comments: There is ecchymosis that is purple of the proximal right great toe and a little yellow of the distal right toe.  It is swollen.  There is a little swelling that extends onto the dorsum of the foot but no ecchymosis there.  Pulses are normal.  Skin:    Coloration: Skin is not pale.  Neurological:     Mental Status: She is alert and oriented to person, place, and time.  Psychiatric:        Behavior: Behavior normal.      UC Treatments / Results  Labs (all labs ordered are listed, but only abnormal results are displayed) Labs Reviewed - No data to display  EKG   Radiology DG Toe Great Right Result Date: 02/20/2023 CLINICAL DATA:  Right great toe pain and swelling after a fall 2 nights ago. Bruising. EXAM: RIGHT GREAT TOE COMPARISON:  None Available. FINDINGS: Comminuted fractures of the distal aspect of the proximal phalanx of the right first toe with mild dorsal angulation of the distal fracture fragments. Fracture lines appear to extend to the articular surface of the interphalangeal joint. Mild degenerative changes in the visualized interphalangeal and metatarsal-phalangeal joints. Soft tissues are unremarkable. IMPRESSION: Acute  intra-articular fracture of the distal aspect proximal phalanx right first toe. Electronically Signed   By: Burman Nieves M.D.   On: 02/20/2023 18:52    Procedures Procedures (including critical care time)  Medications Ordered in UC Medications  ketorolac (TORADOL) 30 MG/ML injection 30 mg (30 mg Intramuscular Given 02/20/23 1905)    Initial Impression / Assessment and Plan / UC Course  I have reviewed the triage vital signs and the nursing notes.  Pertinent labs & imaging results that were available during my care of the patient were reviewed by me and considered in my medical decision making (see chart for details).    X-ray shows a fracture of the distal portion of the proximal phalanx of the great toe.  Small supply of tramadol sent in since she is on Plavix.  One-time Toradol injection given here for the pain.    boot is applied as her crutches.  She will follow-up with her podiatrist/orthopedics with whom she is already established.  Final Clinical Impressions(s) / UC Diagnoses   Final diagnoses:  Great toe pain, right  Closed nondisplaced fracture of proximal phalanx of right great toe, initial encounter     Discharge Instructions      Your big toe has a fracture.  You have been given a shot of Toradol 30 mg today.  He should not take anti-inflammatories much since you are taking the Plavix/clopidogrel.  Take tramadol 50 mg-- 1 tablet every 8 hours as needed for pain.  This medication can make you sleepy or dizzy  Please call your orthopedist/podiatrist about this broken toe tomorrow when they are open.       ED Prescriptions     Medication Sig Dispense Auth. Provider   traMADol (ULTRAM) 50 MG tablet  (Status: Discontinued) Take 1 tablet (50 mg total) by mouth every 8 (eight) hours as needed (pain). 8 tablet Zanya Lindo, Janace Aris, MD   traMADol (ULTRAM) 50 MG tablet Take 1 tablet (50 mg total) by mouth every 8 (eight) hours as needed (pain). 8 tablet  Audry Kauzlarich, Janace Aris, MD      I have reviewed the PDMP during this encounter.   Zenia Resides, MD 02/20/23 Makayla Fischer    Zenia Resides, MD 02/20/23 [phone]

## 2023-02-20 NOTE — ED Triage Notes (Signed)
"  I think I broke my big toe."  Patient fell 2 nights ago and originally didn't notice an injury until later yesterday. States by last night, great toe very bruised and painful when moved.

## 2023-02-20 NOTE — Discharge Instructions (Addendum)
Your big toe has a fracture.  You have been given a shot of Toradol 30 mg today.  He should not take anti-inflammatories much since you are taking the Plavix/clopidogrel.  Take tramadol 50 mg-- 1 tablet every 8 hours as needed for pain.  This medication can make you sleepy or dizzy  Please call your orthopedist/podiatrist about this broken toe tomorrow when they are open.

## 2023-02-24 DIAGNOSIS — S92911A Unspecified fracture of right toe(s), initial encounter for closed fracture: Secondary | ICD-10-CM | POA: Diagnosis not present

## 2023-03-10 ENCOUNTER — Ambulatory Visit: Payer: Medicare HMO | Admitting: Neurology

## 2023-03-10 ENCOUNTER — Encounter: Payer: Self-pay | Admitting: Neurology

## 2023-03-13 DIAGNOSIS — F411 Generalized anxiety disorder: Secondary | ICD-10-CM | POA: Diagnosis not present

## 2023-03-13 DIAGNOSIS — F331 Major depressive disorder, recurrent, moderate: Secondary | ICD-10-CM | POA: Diagnosis not present

## 2023-03-13 DIAGNOSIS — Z23 Encounter for immunization: Secondary | ICD-10-CM | POA: Diagnosis not present

## 2023-03-13 DIAGNOSIS — F1120 Opioid dependence, uncomplicated: Secondary | ICD-10-CM | POA: Diagnosis not present

## 2023-03-24 DIAGNOSIS — M79674 Pain in right toe(s): Secondary | ICD-10-CM | POA: Diagnosis not present

## 2023-03-24 DIAGNOSIS — S92401D Displaced unspecified fracture of right great toe, subsequent encounter for fracture with routine healing: Secondary | ICD-10-CM | POA: Diagnosis not present

## 2023-03-25 ENCOUNTER — Encounter: Payer: Self-pay | Admitting: Cardiology

## 2023-03-25 ENCOUNTER — Ambulatory Visit: Payer: Medicare HMO | Attending: Cardiology | Admitting: Cardiology

## 2023-03-25 VITALS — BP 104/60 | HR 69 | Ht 64.6 in | Wt 157.0 lb

## 2023-03-25 DIAGNOSIS — E785 Hyperlipidemia, unspecified: Secondary | ICD-10-CM

## 2023-03-25 DIAGNOSIS — I2510 Atherosclerotic heart disease of native coronary artery without angina pectoris: Secondary | ICD-10-CM | POA: Diagnosis not present

## 2023-03-25 DIAGNOSIS — E119 Type 2 diabetes mellitus without complications: Secondary | ICD-10-CM

## 2023-03-25 DIAGNOSIS — I10 Essential (primary) hypertension: Secondary | ICD-10-CM

## 2023-03-25 DIAGNOSIS — Z87891 Personal history of nicotine dependence: Secondary | ICD-10-CM | POA: Diagnosis not present

## 2023-03-25 NOTE — Patient Instructions (Signed)
 Medication Instructions:  Your physician recommends that you continue on your current medications as directed. Please refer to the Current Medication list given to you today.  *If you need a refill on your cardiac medications before your next appointment, please call your pharmacy*   Lab Work: None If you have labs (blood work) drawn today and your tests are completely normal, you will receive your results only by: MyChart Message (if you have MyChart) OR A paper copy in the mail If you have any lab test that is abnormal or we need to change your treatment, we will call you to review the results.   Testing/Procedures: None   Follow-Up: At [facility], you and your health needs are our priority.  As part of our continuing mission to provide you with exceptional heart care, we have created designated Provider Care Teams.  These Care Teams include your primary Cardiologist (physician) and Advanced Practice Providers (APPs -  Physician Assistants and Nurse Practitioners) who all work together to provide you with the care you need, when you need it.  We recommend signing up for the patient portal called "MyChart".  Sign up information is provided on this After Visit Summary.  MyChart is used to connect with patients for Virtual Visits (Telemedicine).  Patients are able to view lab/test results, encounter notes, upcoming appointments, etc.  Non-urgent messages can be sent to your provider as well.   To learn more about what you can do with MyChart, go to [email or web address].    Your next appointment:   6 month(s)  Provider:   Gypsy Balsam, MD    Other Instructions None

## 2023-03-25 NOTE — Progress Notes (Signed)
 Cardiology Office Note:  .   Date:  03/25/2023  ID:  TAYAH IDROVO, DOB 11-09-1960, MRN [id number] PCP: Allen, [place], NP  [facility] Providers Cardiologist:  Gypsy Balsam, MD    History of Present Illness: .   Makayla Fischer is a 63 y.o. female with a past medical history of hypertension, CAD s/p DES to proximal LAD most recently in 2022, GERD, DM 2, former tobacco abuse, iron deficiency anemia, dyslipidemia, fibromyalgia.  08/16/2020 left heart cath significant underlying three-vessel CAD, patent stent in the LAD and left circumflex, DES placed to the proximal LAD overlapping the previous stent.  Recommendations for DAPT indefinitely. 04/01/2019 Lexiscan normal, low restudy 09/15/2018 echocardiogram EF 55%, mitral annular calcification  Evaluated by Dr. Bing Matter on 03/12/2022, she did recently stop smoking but was using vapes and trying to quit this as well.  From a cardiac perspective she was doing well, offers no complaints.  Evaluated on 09/26/22 for follow-up of her CAD, was stable from a cardiac perspective but high level of personal stress, advised to follow up in 6 months.   She presents today for follow-up of her CAD.  She has been doing well from a cardiac perspective, offers no formal complaints.  She is still dealing with a high level of personal stress, caring for her husband, but also going through a separation.  She occasionally needs to take a nitroglycerin, possibly taken 1 over the last 6 months.  She denies chest pain, palpitations, dyspnea, pnd, orthopnea, n, v, dizziness, syncope, edema, weight gain, or early satiety.    ROS: Review of Systems  Cardiovascular:  Positive for chest pain.  Endo/Heme/Allergies:  Bruises/bleeds easily.  All other systems reviewed and are negative.    Studies Reviewed: .        Cardiac Studies & Procedures   ______________________________________________________________________________________________ CARDIAC  CATHETERIZATION  CARDIAC CATHETERIZATION 08/16/2020  Narrative   Ost Cx to Mid Cx lesion is 20% stenosed.   Dist LAD lesion is 70% stenosed.   LPAV lesion is 30% stenosed.   Prox LAD lesion is 75% stenosed.   Prox RCA to Mid RCA lesion is 60% stenosed.   Prox LAD to Mid LAD lesion is 30% stenosed.   A drug-eluting stent was successfully placed using a STENT ONYX FRONTIER 3.0X18.   Post intervention, there is a 0% residual stenosis.   The left ventricular systolic function is normal.   LV end diastolic pressure is normal.   The left ventricular ejection fraction is 55-65% by visual estimate.  1.  Significant underlying three-vessel coronary artery disease with patent stents in the LAD and left circumflex.  There is 75% stenosis proximal to the LAD stent that was significant by pressure wire evaluation.  In addition, there is a 60% stenosis in the mid RCA.  The coronary arteries are codominant. 2.  Normal LV systolic function normal left ventricular end-diastolic pressure 3.  Successful OCT guided angioplasty and drug-eluting stent placement to the proximal LAD overlapping the previous stent.  Recommendations: Continue dual antiplatelet therapy indefinitely. Aggressive treatment of risk factors. Treat the right coronary artery medically for now. Possible discharge home tomorrow.  Findings Coronary Findings Diagnostic  Dominance: Co-dominant  Left Main Vessel is angiographically normal.  Left Anterior Descending Prox LAD lesion is 75% stenosed. The lesion is type C and located at the major branch. The lesion is mildly calcified. The lesion was not previously treated . Pressure wire/FFR was performed on the lesion. Optical coherence tomography (OCT) was performed.  A pressure wire was used in the standard fashion.  RFR ratio was 0.85. OCT was performed which showed that the stent diameter was likely 3 mm.  Proximal lesion diameter was 3.4 mm.  The lesion was mildly calcified.  No  significant ostial diagonal disease was noted on OCT. Prox LAD to Mid LAD lesion is 30% stenosed. The lesion was previously treated . Dist LAD lesion is 70% stenosed.  First Diagonal Branch Vessel is large in size. There is mild disease in the vessel.  Second Diagonal Branch Vessel is angiographically normal.  Third Diagonal Branch Vessel is angiographically normal.  Left Circumflex Ost Cx to Mid Cx lesion is 20% stenosed. The lesion was previously treated .  Left Posterior Atrioventricular Artery LPAV lesion is 30% stenosed.  Right Coronary Artery Prox RCA to Mid RCA lesion is 60% stenosed.  Intervention  Prox LAD lesion Stent Lesion length:  16 mm. CATH LAUNCHER 6FR EBU3.5 guide catheter was inserted. Lesion crossed with guidewire using a GUIDEWIRE PRESSURE X 175. Pre-stent angioplasty was performed using a BALLOON SAPPHIRE 2.5X12. Maximum pressure:  8 atm. Inflation time:  30 sec. A drug-eluting stent was successfully placed using a STENT ONYX FRONTIER 3.0X18. Maximum pressure: 14 atm. Inflation time: 20 sec. Stent overlaps previously placed stent. Post-stent angioplasty was performed using a BALLOON SAPPHIRE  3.5X12. Maximum pressure:  14 atm. Inflation time:  20 sec. Runthrough wire was used to advance to the diagonal branch in case rescue PCI was needed.  This wire was removed before deploying the stent. Post-Intervention Lesion Assessment The intervention was successful. Pre-interventional TIMI flow is 3. Post-intervention TIMI flow is 3. No complications occurred at this lesion. There is a 0% residual stenosis post intervention.   STRESS TESTS  MYOCARDIAL PERFUSION IMAGING 04/01/2019  Narrative  The left ventricular ejection fraction is hyperdynamic (>65%).  Nuclear stress EF: 66%.  There was no ST segment deviation noted during stress.  The study is normal.  This is a low risk study.             ______________________________________________________________________________________________      Risk Assessment/Calculations:             Physical Exam:   VS:  BP 104/60 (BP Location: Right Arm, Patient Position: Sitting, Cuff Size: Normal)   Pulse 69   Ht 5' 4.6" (1.641 m)   Wt 157 lb (71.2 kg)   SpO2 92%   BMI 26.45 kg/m    Wt Readings from Last 3 Encounters:  03/25/23 157 lb (71.2 kg)  02/20/23 155 lb (70.3 kg)  09/26/22 153 lb (69.4 kg)    GEN: Well nourished, well developed in no acute distress NECK: No JVD; No carotid bruits CARDIAC: RRR, no murmurs, rubs, gallops RESPIRATORY:  Clear to auscultation without rales, wheezing or rhonchi  ABDOMEN: Soft, non-tender, non-distended EXTREMITIES:  No edema; No deformity   ASSESSMENT AND PLAN: .   Coronary artery disease- left heart cath in 2022 was significant underlying three-vessel CAD, patent stent in the LAD and left circumflex, DES placed to the proximal LAD overlapping the previous stent.  Recommendations for DAPT indefinitely with aspirin 81 mg daily, Plavix 75 mg daily.  Continue nitroglycerin as needed, continue Imdur 30 mg daily, continue Ranexa 500 mg twice daily, continue Coreg 6.25 mg twice daily. Stable angina per outlined above in the HPI, no indication for repeat ischemic evaluation at this time. Heart healthy diet and regular cardiovascular exercise encouraged.    Hypertension-blood pressure is well-controlled at 104/60,  continue lisinopril 20 mg daily, continue amlodipine 2.5 mg daily, continue Coreg 6.25 mg twice daily.  Dyslipidemia-most recent LDL is well-controlled at 66 on 02/12/2023, continue Zetia 10 mg daily, continue Lipitor 80 mg daily.  Prior tobacco abuse-complete cessation x 9 months, she is still vaping however trying to quit that as well.  She plans to establish with a new PCP in a few weeks and will discuss with them about getting lung cancer screening CT.  DM2-is managed by her primary care  physician, she is trying to lose weight and adhere to healthy lifestyle.Heart healthy diet and regular cardiovascular exercise encouraged.        Dispo: Follow-up in 6 months.  Signed, Flossie Dibble, NP

## 2023-04-10 DIAGNOSIS — F331 Major depressive disorder, recurrent, moderate: Secondary | ICD-10-CM | POA: Diagnosis not present

## 2023-04-10 DIAGNOSIS — F1120 Opioid dependence, uncomplicated: Secondary | ICD-10-CM | POA: Diagnosis not present

## 2023-04-10 DIAGNOSIS — F411 Generalized anxiety disorder: Secondary | ICD-10-CM | POA: Diagnosis not present

## 2023-05-05 DIAGNOSIS — M79674 Pain in right toe(s): Secondary | ICD-10-CM | POA: Diagnosis not present

## 2023-05-05 DIAGNOSIS — S92401D Displaced unspecified fracture of right great toe, subsequent encounter for fracture with routine healing: Secondary | ICD-10-CM | POA: Diagnosis not present

## 2023-05-08 DIAGNOSIS — F331 Major depressive disorder, recurrent, moderate: Secondary | ICD-10-CM | POA: Diagnosis not present

## 2023-05-08 DIAGNOSIS — F1120 Opioid dependence, uncomplicated: Secondary | ICD-10-CM | POA: Diagnosis not present

## 2023-05-08 DIAGNOSIS — F411 Generalized anxiety disorder: Secondary | ICD-10-CM | POA: Diagnosis not present

## 2023-05-14 DIAGNOSIS — E663 Overweight: Secondary | ICD-10-CM | POA: Diagnosis not present

## 2023-05-14 DIAGNOSIS — J449 Chronic obstructive pulmonary disease, unspecified: Secondary | ICD-10-CM | POA: Diagnosis not present

## 2023-05-14 DIAGNOSIS — I252 Old myocardial infarction: Secondary | ICD-10-CM | POA: Diagnosis not present

## 2023-05-14 DIAGNOSIS — K7460 Unspecified cirrhosis of liver: Secondary | ICD-10-CM | POA: Diagnosis not present

## 2023-05-14 DIAGNOSIS — E11610 Type 2 diabetes mellitus with diabetic neuropathic arthropathy: Secondary | ICD-10-CM | POA: Diagnosis not present

## 2023-05-14 DIAGNOSIS — F411 Generalized anxiety disorder: Secondary | ICD-10-CM | POA: Diagnosis not present

## 2023-05-14 DIAGNOSIS — I2510 Atherosclerotic heart disease of native coronary artery without angina pectoris: Secondary | ICD-10-CM | POA: Diagnosis not present

## 2023-05-14 DIAGNOSIS — E782 Mixed hyperlipidemia: Secondary | ICD-10-CM | POA: Diagnosis not present

## 2023-05-14 DIAGNOSIS — D509 Iron deficiency anemia, unspecified: Secondary | ICD-10-CM | POA: Diagnosis not present

## 2023-05-22 ENCOUNTER — Ambulatory Visit: Payer: Medicare HMO | Admitting: Neurology

## 2023-05-28 DIAGNOSIS — M549 Dorsalgia, unspecified: Secondary | ICD-10-CM | POA: Diagnosis not present

## 2023-05-28 DIAGNOSIS — I10 Essential (primary) hypertension: Secondary | ICD-10-CM | POA: Diagnosis not present

## 2023-05-28 DIAGNOSIS — F411 Generalized anxiety disorder: Secondary | ICD-10-CM | POA: Diagnosis not present

## 2023-05-28 DIAGNOSIS — I2510 Atherosclerotic heart disease of native coronary artery without angina pectoris: Secondary | ICD-10-CM | POA: Diagnosis not present

## 2023-06-28 DIAGNOSIS — I10 Essential (primary) hypertension: Secondary | ICD-10-CM | POA: Diagnosis not present

## 2023-06-28 DIAGNOSIS — E11610 Type 2 diabetes mellitus with diabetic neuropathic arthropathy: Secondary | ICD-10-CM | POA: Diagnosis not present

## 2023-07-03 DIAGNOSIS — F1120 Opioid dependence, uncomplicated: Secondary | ICD-10-CM | POA: Diagnosis not present

## 2023-07-03 DIAGNOSIS — F411 Generalized anxiety disorder: Secondary | ICD-10-CM | POA: Diagnosis not present

## 2023-07-03 DIAGNOSIS — F331 Major depressive disorder, recurrent, moderate: Secondary | ICD-10-CM | POA: Diagnosis not present

## 2023-07-03 DIAGNOSIS — Z6824 Body mass index (BMI) 24.0-24.9, adult: Secondary | ICD-10-CM | POA: Diagnosis not present

## 2023-07-21 ENCOUNTER — Other Ambulatory Visit: Payer: Self-pay | Admitting: Cardiology

## 2023-07-21 DIAGNOSIS — I25118 Atherosclerotic heart disease of native coronary artery with other forms of angina pectoris: Secondary | ICD-10-CM

## 2023-07-21 DIAGNOSIS — E782 Mixed hyperlipidemia: Secondary | ICD-10-CM

## 2023-07-28 DIAGNOSIS — I10 Essential (primary) hypertension: Secondary | ICD-10-CM | POA: Diagnosis not present

## 2023-07-28 DIAGNOSIS — E11610 Type 2 diabetes mellitus with diabetic neuropathic arthropathy: Secondary | ICD-10-CM | POA: Diagnosis not present

## 2023-07-31 DIAGNOSIS — F1120 Opioid dependence, uncomplicated: Secondary | ICD-10-CM | POA: Diagnosis not present

## 2023-07-31 DIAGNOSIS — Z6826 Body mass index (BMI) 26.0-26.9, adult: Secondary | ICD-10-CM | POA: Diagnosis not present

## 2023-07-31 DIAGNOSIS — F331 Major depressive disorder, recurrent, moderate: Secondary | ICD-10-CM | POA: Diagnosis not present

## 2023-07-31 DIAGNOSIS — F411 Generalized anxiety disorder: Secondary | ICD-10-CM | POA: Diagnosis not present

## 2023-08-08 DIAGNOSIS — M79604 Pain in right leg: Secondary | ICD-10-CM | POA: Diagnosis not present

## 2023-08-08 DIAGNOSIS — M7989 Other specified soft tissue disorders: Secondary | ICD-10-CM | POA: Diagnosis not present

## 2023-08-08 DIAGNOSIS — M7121 Synovial cyst of popliteal space [Baker], right knee: Secondary | ICD-10-CM | POA: Diagnosis not present

## 2023-08-11 DIAGNOSIS — I2510 Atherosclerotic heart disease of native coronary artery without angina pectoris: Secondary | ICD-10-CM | POA: Diagnosis not present

## 2023-08-11 DIAGNOSIS — K7460 Unspecified cirrhosis of liver: Secondary | ICD-10-CM | POA: Diagnosis not present

## 2023-08-11 DIAGNOSIS — Z13820 Encounter for screening for osteoporosis: Secondary | ICD-10-CM | POA: Diagnosis not present

## 2023-08-11 DIAGNOSIS — G8929 Other chronic pain: Secondary | ICD-10-CM | POA: Diagnosis not present

## 2023-08-11 DIAGNOSIS — F411 Generalized anxiety disorder: Secondary | ICD-10-CM | POA: Diagnosis not present

## 2023-08-11 DIAGNOSIS — E782 Mixed hyperlipidemia: Secondary | ICD-10-CM | POA: Diagnosis not present

## 2023-08-11 DIAGNOSIS — M549 Dorsalgia, unspecified: Secondary | ICD-10-CM | POA: Diagnosis not present

## 2023-08-11 DIAGNOSIS — I10 Essential (primary) hypertension: Secondary | ICD-10-CM | POA: Diagnosis not present

## 2023-08-11 DIAGNOSIS — E11610 Type 2 diabetes mellitus with diabetic neuropathic arthropathy: Secondary | ICD-10-CM | POA: Diagnosis not present

## 2023-08-19 ENCOUNTER — Other Ambulatory Visit: Payer: Self-pay | Admitting: Cardiology

## 2023-08-22 DIAGNOSIS — M25561 Pain in right knee: Secondary | ICD-10-CM | POA: Diagnosis not present

## 2023-08-22 DIAGNOSIS — M1711 Unilateral primary osteoarthritis, right knee: Secondary | ICD-10-CM | POA: Diagnosis not present

## 2023-08-28 DIAGNOSIS — I10 Essential (primary) hypertension: Secondary | ICD-10-CM | POA: Diagnosis not present

## 2023-08-28 DIAGNOSIS — F1120 Opioid dependence, uncomplicated: Secondary | ICD-10-CM | POA: Diagnosis not present

## 2023-08-28 DIAGNOSIS — F411 Generalized anxiety disorder: Secondary | ICD-10-CM | POA: Diagnosis not present

## 2023-08-28 DIAGNOSIS — K7460 Unspecified cirrhosis of liver: Secondary | ICD-10-CM | POA: Diagnosis not present

## 2023-08-28 DIAGNOSIS — F331 Major depressive disorder, recurrent, moderate: Secondary | ICD-10-CM | POA: Diagnosis not present

## 2023-08-28 DIAGNOSIS — E11610 Type 2 diabetes mellitus with diabetic neuropathic arthropathy: Secondary | ICD-10-CM | POA: Diagnosis not present

## 2023-09-01 DIAGNOSIS — J439 Emphysema, unspecified: Secondary | ICD-10-CM | POA: Diagnosis not present

## 2023-09-01 DIAGNOSIS — Z87891 Personal history of nicotine dependence: Secondary | ICD-10-CM | POA: Diagnosis not present

## 2023-09-01 DIAGNOSIS — Z122 Encounter for screening for malignant neoplasm of respiratory organs: Secondary | ICD-10-CM | POA: Diagnosis not present

## 2023-09-03 DIAGNOSIS — Z87891 Personal history of nicotine dependence: Secondary | ICD-10-CM | POA: Diagnosis not present

## 2023-09-03 DIAGNOSIS — Z0489 Encounter for examination and observation for other specified reasons: Secondary | ICD-10-CM | POA: Diagnosis not present

## 2023-09-03 DIAGNOSIS — Z122 Encounter for screening for malignant neoplasm of respiratory organs: Secondary | ICD-10-CM | POA: Diagnosis not present

## 2023-09-19 DIAGNOSIS — M8580 Other specified disorders of bone density and structure, unspecified site: Secondary | ICD-10-CM | POA: Diagnosis not present

## 2023-09-19 DIAGNOSIS — N959 Unspecified menopausal and perimenopausal disorder: Secondary | ICD-10-CM | POA: Diagnosis not present

## 2023-09-19 DIAGNOSIS — Z1231 Encounter for screening mammogram for malignant neoplasm of breast: Secondary | ICD-10-CM | POA: Diagnosis not present

## 2023-09-22 DIAGNOSIS — M8589 Other specified disorders of bone density and structure, multiple sites: Secondary | ICD-10-CM | POA: Diagnosis not present

## 2023-09-22 DIAGNOSIS — N959 Unspecified menopausal and perimenopausal disorder: Secondary | ICD-10-CM | POA: Diagnosis not present

## 2023-09-25 DIAGNOSIS — Z6826 Body mass index (BMI) 26.0-26.9, adult: Secondary | ICD-10-CM | POA: Diagnosis not present

## 2023-09-25 DIAGNOSIS — F411 Generalized anxiety disorder: Secondary | ICD-10-CM | POA: Diagnosis not present

## 2023-09-25 DIAGNOSIS — F1120 Opioid dependence, uncomplicated: Secondary | ICD-10-CM | POA: Diagnosis not present

## 2023-09-25 DIAGNOSIS — F331 Major depressive disorder, recurrent, moderate: Secondary | ICD-10-CM | POA: Diagnosis not present

## 2023-09-28 DIAGNOSIS — E11610 Type 2 diabetes mellitus with diabetic neuropathic arthropathy: Secondary | ICD-10-CM | POA: Diagnosis not present

## 2023-09-28 DIAGNOSIS — I10 Essential (primary) hypertension: Secondary | ICD-10-CM | POA: Diagnosis not present

## 2023-10-05 ENCOUNTER — Other Ambulatory Visit: Payer: Self-pay | Admitting: Cardiology

## 2023-10-17 ENCOUNTER — Other Ambulatory Visit: Payer: Self-pay | Admitting: Cardiology

## 2023-10-17 DIAGNOSIS — I25118 Atherosclerotic heart disease of native coronary artery with other forms of angina pectoris: Secondary | ICD-10-CM

## 2023-10-21 DIAGNOSIS — R102 Pelvic and perineal pain: Secondary | ICD-10-CM | POA: Diagnosis not present

## 2023-10-21 DIAGNOSIS — N898 Other specified noninflammatory disorders of vagina: Secondary | ICD-10-CM | POA: Diagnosis not present

## 2023-10-23 DIAGNOSIS — F331 Major depressive disorder, recurrent, moderate: Secondary | ICD-10-CM | POA: Diagnosis not present

## 2023-10-23 DIAGNOSIS — F411 Generalized anxiety disorder: Secondary | ICD-10-CM | POA: Diagnosis not present

## 2023-10-23 DIAGNOSIS — Z6826 Body mass index (BMI) 26.0-26.9, adult: Secondary | ICD-10-CM | POA: Diagnosis not present

## 2023-10-23 DIAGNOSIS — F1120 Opioid dependence, uncomplicated: Secondary | ICD-10-CM | POA: Diagnosis not present

## 2023-10-28 DIAGNOSIS — I10 Essential (primary) hypertension: Secondary | ICD-10-CM | POA: Diagnosis not present

## 2023-10-28 DIAGNOSIS — F411 Generalized anxiety disorder: Secondary | ICD-10-CM | POA: Diagnosis not present

## 2023-11-12 DIAGNOSIS — E11610 Type 2 diabetes mellitus with diabetic neuropathic arthropathy: Secondary | ICD-10-CM | POA: Diagnosis not present

## 2023-11-12 DIAGNOSIS — Z6826 Body mass index (BMI) 26.0-26.9, adult: Secondary | ICD-10-CM | POA: Diagnosis not present

## 2023-11-12 DIAGNOSIS — I10 Essential (primary) hypertension: Secondary | ICD-10-CM | POA: Diagnosis not present

## 2023-11-12 DIAGNOSIS — Z23 Encounter for immunization: Secondary | ICD-10-CM | POA: Diagnosis not present

## 2023-11-12 DIAGNOSIS — F411 Generalized anxiety disorder: Secondary | ICD-10-CM | POA: Diagnosis not present

## 2023-11-20 DIAGNOSIS — F411 Generalized anxiety disorder: Secondary | ICD-10-CM | POA: Diagnosis not present

## 2023-11-20 DIAGNOSIS — Z6826 Body mass index (BMI) 26.0-26.9, adult: Secondary | ICD-10-CM | POA: Diagnosis not present

## 2023-11-20 DIAGNOSIS — F331 Major depressive disorder, recurrent, moderate: Secondary | ICD-10-CM | POA: Diagnosis not present

## 2023-11-20 DIAGNOSIS — F1120 Opioid dependence, uncomplicated: Secondary | ICD-10-CM | POA: Diagnosis not present

## 2023-11-28 DIAGNOSIS — I2510 Atherosclerotic heart disease of native coronary artery without angina pectoris: Secondary | ICD-10-CM | POA: Diagnosis not present

## 2023-11-28 DIAGNOSIS — M549 Dorsalgia, unspecified: Secondary | ICD-10-CM | POA: Diagnosis not present

## 2023-11-28 DIAGNOSIS — I10 Essential (primary) hypertension: Secondary | ICD-10-CM | POA: Diagnosis not present

## 2023-11-28 DIAGNOSIS — F411 Generalized anxiety disorder: Secondary | ICD-10-CM | POA: Diagnosis not present

## 2023-12-01 DIAGNOSIS — Z0000 Encounter for general adult medical examination without abnormal findings: Secondary | ICD-10-CM | POA: Diagnosis not present

## 2023-12-01 DIAGNOSIS — Z9181 History of falling: Secondary | ICD-10-CM | POA: Diagnosis not present

## 2023-12-18 DIAGNOSIS — F411 Generalized anxiety disorder: Secondary | ICD-10-CM | POA: Diagnosis not present

## 2023-12-18 DIAGNOSIS — F1120 Opioid dependence, uncomplicated: Secondary | ICD-10-CM | POA: Diagnosis not present

## 2023-12-18 DIAGNOSIS — F331 Major depressive disorder, recurrent, moderate: Secondary | ICD-10-CM | POA: Diagnosis not present

## 2023-12-18 DIAGNOSIS — Z6826 Body mass index (BMI) 26.0-26.9, adult: Secondary | ICD-10-CM | POA: Diagnosis not present

## 2023-12-22 DIAGNOSIS — R102 Pelvic and perineal pain unspecified side: Secondary | ICD-10-CM | POA: Diagnosis not present

## 2023-12-22 DIAGNOSIS — N83202 Unspecified ovarian cyst, left side: Secondary | ICD-10-CM | POA: Diagnosis not present

## 2023-12-22 DIAGNOSIS — N83201 Unspecified ovarian cyst, right side: Secondary | ICD-10-CM | POA: Diagnosis not present

## 2024-01-07 ENCOUNTER — Encounter: Payer: Self-pay | Admitting: Hematology and Oncology

## 2024-01-30 ENCOUNTER — Encounter: Payer: Self-pay | Admitting: Hematology and Oncology

## 2024-02-06 ENCOUNTER — Ambulatory Visit: Attending: Cardiology | Admitting: Cardiology

## 2024-02-06 ENCOUNTER — Encounter: Payer: Self-pay | Admitting: Cardiology

## 2024-02-06 VITALS — BP 128/74 | HR 57 | Ht 64.6 in | Wt 156.4 lb

## 2024-02-06 DIAGNOSIS — E782 Mixed hyperlipidemia: Secondary | ICD-10-CM

## 2024-02-06 DIAGNOSIS — E119 Type 2 diabetes mellitus without complications: Secondary | ICD-10-CM | POA: Diagnosis not present

## 2024-02-06 DIAGNOSIS — I2510 Atherosclerotic heart disease of native coronary artery without angina pectoris: Secondary | ICD-10-CM | POA: Diagnosis not present

## 2024-02-06 DIAGNOSIS — I10 Essential (primary) hypertension: Secondary | ICD-10-CM

## 2024-02-06 DIAGNOSIS — E785 Hyperlipidemia, unspecified: Secondary | ICD-10-CM | POA: Diagnosis not present

## 2024-02-06 NOTE — Patient Instructions (Signed)

## 2024-02-06 NOTE — Progress Notes (Signed)
 " Cardiology Office Note:    Date:  02/06/2024   ID:  Makayla Fischer, DOB 05-19-1960, MRN [id number]  PCP:  Allen, Chad, NP  Cardiologist:  Lamar Fitch, MD    Referring MD: Allen, Chad, NP   No chief complaint on file. Doing fine  History of Present Illness:    Makayla Fischer is a 64 y.o. female    with past medical history significant for coronary artery disease status post PTCA and stenting with drug-eluting stent to circumflex artery in August 2020, prior she did have PTCA and stenting of LAD.  In March of this year she did have a stress test done around the hospital which was negative, past medical history is also significant for essential hypertension, dyslipidemia, chronic smoking In August 16, 2020 she did have another cardiac catheterization done and she required stenting to the left anterior descending artery. Comes today to months for follow-up overall cardiac wise doing well.  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 Royanne, stress test in 01/2011   Hypomagnesemia 08/10/2017   Hyponatremia 08/10/2017   Incisional  hernia    with obstruction   Myocardial infarction (HCC) 02/2008   Neuromuscular disorder (HCC)    neuropathy   Osteoporosis    Pulmonary nodule less than 6 mm in diameter with high risk for malignant neoplasm 11/23/2019   Shortness of breath    Status post insertion of drug eluting coronary artery stent 09/15/2018   Substance abuse (HCC)    drugs and alcohol   Tobacco abuse 08/10/2017   Type II diabetes mellitus, well controlled (HCC) 08/10/2017   Uncomplicated opioid dependence (HCC) 08/10/2017   Unstable angina (HCC) 09/15/2018   Varicose veins of leg with complications 06/20/2014   Varicose veins of lower extremities with other complications 10/12/2013   Varicose veins of lower extremity 10/12/2013    Past Surgical History:  Procedure Laterality Date   CARDIAC CATHETERIZATION     02/2008- Genetta Potters, x2 stents    CARDIAC SURGERY     CHOLECYSTECTOMY     CORONARY IMAGING/OCT N/A 08/16/2020   Procedure: INTRAVASCULAR IMAGING/OCT;  Surgeon: Darron Deatrice LABOR, MD;  Location: [facility];  Service: Cardiovascular;  Laterality: N/A;   CORONARY PRESSURE/FFR STUDY N/A 08/16/2020   Procedure: INTRAVASCULAR PRESSURE WIRE/FFR STUDY;  Surgeon: Darron Deatrice LABOR, MD;  Location: [facility];  Service: Cardiovascular;  Laterality: N/A;   CORONARY STENT INTERVENTION N/A 08/16/2020   Procedure: CORONARY STENT INTERVENTION;  Surgeon: Darron Deatrice LABOR, MD;  Location: [facility] CV  LAB;  Service: Cardiovascular;  Laterality: N/A;   LEFT HEART CATH AND CORONARY ANGIOGRAPHY N/A 08/16/2020   Procedure: LEFT HEART CATH AND CORONARY ANGIOGRAPHY;  Surgeon: Darron Deatrice LABOR, MD;  Location: [facility] CV [facility];  Service: Cardiovascular;  Laterality: N/A;   MANDIBLE FRACTURE SURGERY     1980   TONSILLECTOMY AND ADENOIDECTOMY     TUBAL LIGATION     VENTRAL HERNIA REPAIR  07/12/2011   Procedure: LAPAROSCOPIC VENTRAL HERNIA;  Surgeon: Elon CHRISTELLA Pacini, MD;  Location: [facility];  Service:  General;  Laterality: N/A;  laparoscopic repair incisional hernia with mesh    Current Medications: Active Medications[1]   Allergies:   Septra [bactrim] and Sulfamethoxazole-trimethoprim   Social History   Socioeconomic History   Marital status: Married    Spouse name: Not on file   Number of children: 2   Years of education: 12   Highest education level: High school graduate  Occupational History   Occupation: disabled  Tobacco Use   Smoking status: Former    Current packs/day: 0.00    Average packs/day: 1 pack/day for 40.0 years (40.0 ttl pk-yrs)    Types: Cigarettes, E-cigarettes    Start date: 02/03/1982    Quit date: 02/03/2022    Years since quitting: 2.0   Smokeless tobacco: Never  Vaping Use   Vaping status: Every Day  Substance and Sexual Activity   Alcohol use: Not Currently    Alcohol/week: 21.0 standard drinks of alcohol    Types: 21 Standard drinks or equivalent per week    Comment: 3 mikes hard lemonades a day   Drug use: No    Types: Heroin    Comment: 1.5 yr. use of Heroin- 1990's    Sexual activity: Not Currently  Other Topics Concern   Not on file  Social History Narrative   Not on file   Social Drivers of Health   Tobacco Use: Medium Risk (02/06/2024)   Patient History    Smoking Tobacco Use: Former    Smokeless Tobacco Use: Never    Passive Exposure: Not on Actuary Strain: Not on file  Food Insecurity: Not on file  Transportation Needs: No Transportation Needs (09/20/2021)   PRAPARE - Transportation    Lack of Transportation (Medical): No    Lack of Transportation (Non-Medical): No  Physical Activity: Not on file  Stress: Not on file  Social Connections: Not on file  Depression (EYV7-0): Not on file  Alcohol Screen: Not on file  Housing: Not on file  Utilities: Not on file  Health Literacy: Not on file     Family History: The patient's family history includes COPD in her father; Cancer in her father; Colon cancer in her  paternal uncle; Diabetes in her mother; Heart disease in her mother; Hyperlipidemia in her mother; Hypertension in her mother; Pancreatitis in her mother; Throat cancer in her maternal uncle. There is no history of Anesthesia problems. ROS:   Please see the history of present illness.    All 14 point review of systems negative except as described per history of present illness  EKGs/Labs/Other Studies Reviewed:    EKG Interpretation Date/Time:  Friday February 06 2024 14:12:19 EST Ventricular Rate:  57 PR Interval:  182 QRS Duration:  96 QT Interval:  426 QTC Calculation: 414 R Axis:   55  Text Interpretation: Sinus bradycardia When compared with ECG of 26-Sep-2022 15:12, No significant change was found Confirmed by Bernie Charleston [phone]) on 02/06/2024 2:17:56 PM  Recent Labs: No results found for requested labs within last 365 days.  Recent Lipid Panel    Component Value Date/Time   CHOL 134 12/31/2019 1440   TRIG 77 12/31/2019 1440   HDL 38 (L) 12/31/2019 1440   CHOLHDL 3.5 12/31/2019 1440   LDLCALC 81 12/31/2019 1440    Physical Exam:    VS:  BP 128/74   Pulse (!) 57   Ht 5' 4.6 (1.641 m)   Wt 156 lb 6.4 oz (70.9 kg)   SpO2 96%   BMI 26.35 kg/m     Wt Readings from Last 3 Encounters:  02/06/24 156 lb 6.4 oz (70.9 kg)  03/25/23 157 lb (71.2 kg)  02/20/23 155 lb (70.3 kg)     GEN:  Well nourished, well developed in no acute distress HEENT: Normal NECK: No JVD; No carotid bruits LYMPHATICS: No lymphadenopathy CARDIAC: RRR, no murmurs, no rubs, no gallops RESPIRATORY:  Clear to auscultation without rales, wheezing or rhonchi  ABDOMEN: Soft, non-tender, non-distended MUSCULOSKELETAL:  No edema; No deformity  SKIN: Warm and dry LOWER EXTREMITIES: no swelling NEUROLOGIC:  Alert and oriented x 3 PSYCHIATRIC:  Normal affect   ASSESSMENT:    1. Mixed hyperlipidemia   2. Essential hypertension   3. Coronary artery disease involving native coronary artery of  native heart without angina pectoris   4. Type II diabetes mellitus, well controlled (HCC)   5. Dyslipidemia    PLAN:    In order of problems listed above:  Coronary disease stable very rare episode of chest pain with extreme exertion but she still walks on the regular basis with her dog and have no difficulty doing it. Mixed dyslipidemia she is taking Lipitor  80 and Zetia  10 I did review [facility] which show me LDL of 59 HDL 49 this is from 08/11/2023. Type 2 diabetes last hemoglobin A1c 5.3 excellent control continue present management. Essential hypertension blood pressure well-controlled continue present management   Medication Adjustments/Labs and Tests Ordered: Current medicines are reviewed at length with the patient today.  Concerns regarding medicines are outlined above.  Orders Placed This Encounter  Procedures   EKG 12-Lead   Medication changes: No orders of the defined types were placed in this encounter.   Signed, Lamar DOROTHA Fitch, MD, [facility] 02/06/2024 2:25 PM    [facility]    [1]  Current Meds  Medication Sig   albuterol  (VENTOLIN  HFA) 108 (90 Base) MCG/ACT inhaler Inhale 2 puffs into the lungs every 6 (six) hours as needed for wheezing or shortness of breath.   amLODipine  (NORVASC ) 2.5 MG tablet Take 2.5 mg by mouth daily.   aspirin  81 MG tablet Take 81 mg by mouth daily.   atorvastatin  (LIPITOR ) 80 MG tablet TAKE 1 TABLET(80 MG) BY MOUTH DAILY   Buprenorphine  HCl-Naloxone HCl 8-2 MG FILM Place 1 Film under the tongue 2 (two) times daily.   carvedilol  (COREG ) 6.25 MG tablet Take 6.25 mg by mouth 2 (two) times daily with a meal.   clonazePAM  (KLONOPIN ) 1 MG tablet Take 1 mg by mouth 2 (two) times daily.   clopidogrel  (PLAVIX ) 75 MG tablet TAKE 1 TABLET(75 MG) BY MOUTH DAILY   cyclobenzaprine  (FLEXERIL ) 10 MG tablet Take 10 mg by mouth 3 (three) times daily as needed for muscle spasms. For muscle pain   ezetimibe  (ZETIA ) 10 MG tablet TAKE 1  TABLET(10 MG) BY MOUTH DAILY   fluticasone (FLONASE) 50 MCG/ACT nasal spray Place 2 sprays into both nostrils daily.  gabapentin  (NEURONTIN ) 600 MG tablet Take 600 mg by mouth 4 (four) times daily.   isosorbide  mononitrate (IMDUR ) 30 MG 24 hr tablet Take 1 tablet by mouth daily.   lisinopril  (ZESTRIL ) 20 MG tablet Take 20 mg by mouth 2 (two) times daily.   magnesium  oxide (MAG-OX) 400 MG tablet Take 1 tablet by mouth 2 (two) times daily.   naloxone (NARCAN) nasal spray 4 mg/0.1 mL Place 1 spray into the nose once.   nitroGLYCERIN  (NITROSTAT ) 0.4 MG SL tablet DISSOLVE 1 TABLET UNDER THE TONGUE AS NEEDED FOR CHEST PAIN EVERY 5 MINUTES FOR UP TO 3 DOSES   ondansetron  (ZOFRAN -ODT) 4 MG disintegrating tablet Take 4 mg by mouth every 6 (six) hours as needed.   pantoprazole  (PROTONIX ) 40 MG tablet Take 40 mg by mouth daily.   potassium gluconate 595 (99 K) MG TABS tablet Take 595 mg by mouth 2 (two) times daily.   ranolazine  (RANEXA ) 500 MG 12 hr tablet Take 1 tablet (500 mg total) by mouth 2 (two) times daily.   traMADol  (ULTRAM ) 50 MG tablet Take 1 tablet (50 mg total) by mouth every 8 (eight) hours as needed (pain).   "

## 2024-02-09 ENCOUNTER — Other Ambulatory Visit: Payer: Self-pay

## 2024-02-09 MED ORDER — EZETIMIBE 10 MG PO TABS: 10.0000 mg | ORAL_TABLET | Freq: Every day | ORAL | 3 refills | Status: AC
